# Patient Record
Sex: Female | Born: 1940 | Race: White | Hispanic: No | Smoking: Former smoker
Health system: Southern US, Community
[De-identification: ages and names within clinical notes are randomized; demographics above are authoritative.]

## PROBLEM LIST (undated history)

## (undated) DIAGNOSIS — I714 Abdominal aortic aneurysm, without rupture: Secondary | ICD-10-CM

## (undated) DIAGNOSIS — I671 Cerebral aneurysm, nonruptured: Secondary | ICD-10-CM

## (undated) DIAGNOSIS — F319 Bipolar disorder, unspecified: Secondary | ICD-10-CM

## (undated) DIAGNOSIS — I10 Essential (primary) hypertension: Secondary | ICD-10-CM

## (undated) DIAGNOSIS — G8929 Other chronic pain: Secondary | ICD-10-CM

## (undated) DIAGNOSIS — M549 Dorsalgia, unspecified: Secondary | ICD-10-CM

## (undated) DIAGNOSIS — C801 Malignant (primary) neoplasm, unspecified: Secondary | ICD-10-CM

## (undated) DIAGNOSIS — J449 Chronic obstructive pulmonary disease, unspecified: Secondary | ICD-10-CM

## (undated) DIAGNOSIS — J42 Unspecified chronic bronchitis: Secondary | ICD-10-CM

## (undated) HISTORY — DX: Dorsalgia, unspecified: M54.9

## (undated) HISTORY — PX: POLYPECTOMY: SHX149

## (undated) HISTORY — DX: Unspecified chronic bronchitis: J42

## (undated) HISTORY — PX: ABDOMINAL HYSTERECTOMY: SHX81

## (undated) HISTORY — DX: Abdominal aortic aneurysm, without rupture: I71.4

## (undated) HISTORY — DX: Malignant (primary) neoplasm, unspecified: C80.1

## (undated) HISTORY — DX: Essential (primary) hypertension: I10

## (undated) HISTORY — DX: Cerebral aneurysm, nonruptured: I67.1

## (undated) HISTORY — PX: BACK SURGERY: SHX140

## (undated) HISTORY — PX: ANEURYSM COILING: SHX5349

## (undated) HISTORY — DX: Other chronic pain: G89.29

## (undated) HISTORY — DX: Chronic obstructive pulmonary disease, unspecified: J44.9

## (undated) HISTORY — PX: CARDIAC SURGERY: SHX584

---

## 1997-06-09 ENCOUNTER — Encounter
Admission: RE | Admit: 1997-06-09 | Discharge: 1997-09-07 | Payer: Self-pay | Admitting: Physical Medicine and Rehabilitation

## 1997-10-02 ENCOUNTER — Encounter: Admission: RE | Admit: 1997-10-02 | Discharge: 1997-12-31 | Payer: Self-pay | Admitting: Orthopedic Surgery

## 1998-06-30 ENCOUNTER — Other Ambulatory Visit: Admission: RE | Admit: 1998-06-30 | Discharge: 1998-06-30 | Payer: Self-pay | Admitting: Urology

## 1998-10-08 ENCOUNTER — Ambulatory Visit (HOSPITAL_COMMUNITY): Admission: RE | Admit: 1998-10-08 | Discharge: 1998-10-08 | Payer: Self-pay | Admitting: Neurological Surgery

## 1998-10-08 ENCOUNTER — Encounter: Payer: Self-pay | Admitting: Neurological Surgery

## 1998-12-10 ENCOUNTER — Encounter: Payer: Self-pay | Admitting: Orthopedic Surgery

## 1998-12-10 ENCOUNTER — Encounter: Admission: RE | Admit: 1998-12-10 | Discharge: 1998-12-10 | Payer: Self-pay | Admitting: Orthopedic Surgery

## 2009-08-06 ENCOUNTER — Emergency Department (HOSPITAL_BASED_OUTPATIENT_CLINIC_OR_DEPARTMENT_OTHER): Admission: EM | Admit: 2009-08-06 | Discharge: 2009-08-06 | Payer: Self-pay | Admitting: Emergency Medicine

## 2009-08-06 ENCOUNTER — Ambulatory Visit: Payer: Self-pay | Admitting: Diagnostic Radiology

## 2015-01-08 DIAGNOSIS — F308 Other manic episodes: Secondary | ICD-10-CM | POA: Diagnosis not present

## 2015-01-08 DIAGNOSIS — R05 Cough: Secondary | ICD-10-CM | POA: Diagnosis not present

## 2015-01-08 DIAGNOSIS — I951 Orthostatic hypotension: Secondary | ICD-10-CM | POA: Diagnosis not present

## 2015-01-08 DIAGNOSIS — F319 Bipolar disorder, unspecified: Secondary | ICD-10-CM | POA: Diagnosis not present

## 2015-01-08 DIAGNOSIS — Z79899 Other long term (current) drug therapy: Secondary | ICD-10-CM | POA: Diagnosis not present

## 2015-01-08 DIAGNOSIS — I10 Essential (primary) hypertension: Secondary | ICD-10-CM | POA: Diagnosis not present

## 2015-01-08 DIAGNOSIS — T887XXA Unspecified adverse effect of drug or medicament, initial encounter: Secondary | ICD-10-CM | POA: Diagnosis not present

## 2015-01-11 DIAGNOSIS — F0280 Dementia in other diseases classified elsewhere without behavioral disturbance: Secondary | ICD-10-CM | POA: Diagnosis not present

## 2015-01-11 DIAGNOSIS — E7800 Pure hypercholesterolemia, unspecified: Secondary | ICD-10-CM | POA: Diagnosis present

## 2015-01-11 DIAGNOSIS — R569 Unspecified convulsions: Secondary | ICD-10-CM | POA: Diagnosis not present

## 2015-01-11 DIAGNOSIS — Z9181 History of falling: Secondary | ICD-10-CM | POA: Diagnosis not present

## 2015-01-11 DIAGNOSIS — I10 Essential (primary) hypertension: Secondary | ICD-10-CM | POA: Diagnosis not present

## 2015-01-11 DIAGNOSIS — Z882 Allergy status to sulfonamides status: Secondary | ICD-10-CM | POA: Diagnosis not present

## 2015-01-11 DIAGNOSIS — F0390 Unspecified dementia without behavioral disturbance: Secondary | ICD-10-CM | POA: Diagnosis not present

## 2015-01-11 DIAGNOSIS — Z888 Allergy status to other drugs, medicaments and biological substances status: Secondary | ICD-10-CM | POA: Diagnosis not present

## 2015-01-11 DIAGNOSIS — F0391 Unspecified dementia with behavioral disturbance: Secondary | ICD-10-CM | POA: Diagnosis not present

## 2015-01-11 DIAGNOSIS — E782 Mixed hyperlipidemia: Secondary | ICD-10-CM | POA: Diagnosis not present

## 2015-01-11 DIAGNOSIS — F319 Bipolar disorder, unspecified: Secondary | ICD-10-CM | POA: Diagnosis not present

## 2015-01-11 DIAGNOSIS — Z885 Allergy status to narcotic agent status: Secondary | ICD-10-CM | POA: Diagnosis not present

## 2015-01-11 DIAGNOSIS — R451 Restlessness and agitation: Secondary | ICD-10-CM | POA: Diagnosis not present

## 2015-01-11 DIAGNOSIS — B961 Klebsiella pneumoniae [K. pneumoniae] as the cause of diseases classified elsewhere: Secondary | ICD-10-CM | POA: Diagnosis present

## 2015-01-11 DIAGNOSIS — Z91041 Radiographic dye allergy status: Secondary | ICD-10-CM | POA: Diagnosis not present

## 2015-01-11 DIAGNOSIS — I671 Cerebral aneurysm, nonruptured: Secondary | ICD-10-CM | POA: Diagnosis present

## 2015-01-11 DIAGNOSIS — F17200 Nicotine dependence, unspecified, uncomplicated: Secondary | ICD-10-CM | POA: Diagnosis present

## 2015-01-11 DIAGNOSIS — Z880 Allergy status to penicillin: Secondary | ICD-10-CM | POA: Diagnosis not present

## 2015-01-11 DIAGNOSIS — R4182 Altered mental status, unspecified: Secondary | ICD-10-CM | POA: Diagnosis not present

## 2015-01-11 DIAGNOSIS — G4700 Insomnia, unspecified: Secondary | ICD-10-CM | POA: Diagnosis present

## 2015-01-11 DIAGNOSIS — E876 Hypokalemia: Secondary | ICD-10-CM | POA: Diagnosis not present

## 2015-01-11 DIAGNOSIS — F3113 Bipolar disorder, current episode manic without psychotic features, severe: Secondary | ICD-10-CM | POA: Diagnosis not present

## 2015-01-11 DIAGNOSIS — K219 Gastro-esophageal reflux disease without esophagitis: Secondary | ICD-10-CM | POA: Diagnosis present

## 2015-01-11 DIAGNOSIS — F0151 Vascular dementia with behavioral disturbance: Secondary | ICD-10-CM | POA: Diagnosis present

## 2015-01-11 DIAGNOSIS — G300 Alzheimer's disease with early onset: Secondary | ICD-10-CM | POA: Diagnosis not present

## 2015-01-11 DIAGNOSIS — F312 Bipolar disorder, current episode manic severe with psychotic features: Secondary | ICD-10-CM | POA: Diagnosis present

## 2015-01-11 DIAGNOSIS — N390 Urinary tract infection, site not specified: Secondary | ICD-10-CM | POA: Diagnosis not present

## 2015-01-11 DIAGNOSIS — R45851 Suicidal ideations: Secondary | ICD-10-CM | POA: Diagnosis not present

## 2015-01-11 DIAGNOSIS — F329 Major depressive disorder, single episode, unspecified: Secondary | ICD-10-CM | POA: Diagnosis not present

## 2015-01-11 DIAGNOSIS — E785 Hyperlipidemia, unspecified: Secondary | ICD-10-CM | POA: Diagnosis present

## 2015-01-11 DIAGNOSIS — M542 Cervicalgia: Secondary | ICD-10-CM | POA: Diagnosis present

## 2015-01-11 DIAGNOSIS — Z91040 Latex allergy status: Secondary | ICD-10-CM | POA: Diagnosis not present

## 2015-01-11 DIAGNOSIS — Z881 Allergy status to other antibiotic agents status: Secondary | ICD-10-CM | POA: Diagnosis not present

## 2015-01-11 DIAGNOSIS — G40319 Generalized idiopathic epilepsy and epileptic syndromes, intractable, without status epilepticus: Secondary | ICD-10-CM | POA: Diagnosis not present

## 2015-01-26 DIAGNOSIS — R569 Unspecified convulsions: Secondary | ICD-10-CM | POA: Diagnosis not present

## 2015-02-26 DIAGNOSIS — F3111 Bipolar disorder, current episode manic without psychotic features, mild: Secondary | ICD-10-CM | POA: Diagnosis not present

## 2015-02-26 DIAGNOSIS — F411 Generalized anxiety disorder: Secondary | ICD-10-CM | POA: Diagnosis not present

## 2015-03-10 DIAGNOSIS — R5383 Other fatigue: Secondary | ICD-10-CM | POA: Diagnosis not present

## 2015-03-10 DIAGNOSIS — F319 Bipolar disorder, unspecified: Secondary | ICD-10-CM | POA: Diagnosis not present

## 2015-03-10 DIAGNOSIS — R29898 Other symptoms and signs involving the musculoskeletal system: Secondary | ICD-10-CM | POA: Diagnosis not present

## 2015-03-10 DIAGNOSIS — G629 Polyneuropathy, unspecified: Secondary | ICD-10-CM | POA: Diagnosis not present

## 2015-03-10 DIAGNOSIS — Z79899 Other long term (current) drug therapy: Secondary | ICD-10-CM | POA: Diagnosis not present

## 2015-03-15 DIAGNOSIS — L570 Actinic keratosis: Secondary | ICD-10-CM | POA: Diagnosis not present

## 2015-03-15 DIAGNOSIS — L259 Unspecified contact dermatitis, unspecified cause: Secondary | ICD-10-CM | POA: Diagnosis not present

## 2015-03-30 DIAGNOSIS — F411 Generalized anxiety disorder: Secondary | ICD-10-CM | POA: Diagnosis not present

## 2015-03-30 DIAGNOSIS — F3111 Bipolar disorder, current episode manic without psychotic features, mild: Secondary | ICD-10-CM | POA: Diagnosis not present

## 2015-04-28 DIAGNOSIS — L409 Psoriasis, unspecified: Secondary | ICD-10-CM | POA: Diagnosis not present

## 2015-04-28 DIAGNOSIS — T63301A Toxic effect of unspecified spider venom, accidental (unintentional), initial encounter: Secondary | ICD-10-CM | POA: Diagnosis not present

## 2015-04-28 DIAGNOSIS — Z8659 Personal history of other mental and behavioral disorders: Secondary | ICD-10-CM | POA: Diagnosis not present

## 2015-04-28 DIAGNOSIS — F319 Bipolar disorder, unspecified: Secondary | ICD-10-CM | POA: Diagnosis not present

## 2015-05-25 DIAGNOSIS — F411 Generalized anxiety disorder: Secondary | ICD-10-CM | POA: Diagnosis not present

## 2015-05-25 DIAGNOSIS — F3111 Bipolar disorder, current episode manic without psychotic features, mild: Secondary | ICD-10-CM | POA: Diagnosis not present

## 2015-06-08 DIAGNOSIS — E039 Hypothyroidism, unspecified: Secondary | ICD-10-CM | POA: Diagnosis present

## 2015-06-08 DIAGNOSIS — F17210 Nicotine dependence, cigarettes, uncomplicated: Secondary | ICD-10-CM | POA: Diagnosis present

## 2015-06-08 DIAGNOSIS — S0093XA Contusion of unspecified part of head, initial encounter: Secondary | ICD-10-CM | POA: Diagnosis present

## 2015-06-08 DIAGNOSIS — I609 Nontraumatic subarachnoid hemorrhage, unspecified: Secondary | ICD-10-CM | POA: Diagnosis not present

## 2015-06-08 DIAGNOSIS — S065X9A Traumatic subdural hemorrhage with loss of consciousness of unspecified duration, initial encounter: Secondary | ICD-10-CM | POA: Diagnosis not present

## 2015-06-08 DIAGNOSIS — K219 Gastro-esophageal reflux disease without esophagitis: Secondary | ICD-10-CM | POA: Diagnosis present

## 2015-06-08 DIAGNOSIS — M1389 Other specified arthritis, multiple sites: Secondary | ICD-10-CM | POA: Diagnosis not present

## 2015-06-08 DIAGNOSIS — S0510XA Contusion of eyeball and orbital tissues, unspecified eye, initial encounter: Secondary | ICD-10-CM | POA: Diagnosis not present

## 2015-06-08 DIAGNOSIS — M47812 Spondylosis without myelopathy or radiculopathy, cervical region: Secondary | ICD-10-CM | POA: Diagnosis not present

## 2015-06-08 DIAGNOSIS — E785 Hyperlipidemia, unspecified: Secondary | ICD-10-CM | POA: Diagnosis present

## 2015-06-08 DIAGNOSIS — S0993XA Unspecified injury of face, initial encounter: Secondary | ICD-10-CM | POA: Diagnosis not present

## 2015-06-08 DIAGNOSIS — Z79899 Other long term (current) drug therapy: Secondary | ICD-10-CM | POA: Diagnosis not present

## 2015-06-08 DIAGNOSIS — S066X0A Traumatic subarachnoid hemorrhage without loss of consciousness, initial encounter: Secondary | ICD-10-CM | POA: Diagnosis not present

## 2015-06-08 DIAGNOSIS — Z716 Tobacco abuse counseling: Secondary | ICD-10-CM | POA: Diagnosis not present

## 2015-06-08 DIAGNOSIS — Z9181 History of falling: Secondary | ICD-10-CM | POA: Diagnosis not present

## 2015-06-08 DIAGNOSIS — S0012XA Contusion of left eyelid and periocular area, initial encounter: Secondary | ICD-10-CM | POA: Diagnosis not present

## 2015-06-08 DIAGNOSIS — Z87820 Personal history of traumatic brain injury: Secondary | ICD-10-CM | POA: Diagnosis not present

## 2015-06-08 DIAGNOSIS — T07 Unspecified multiple injuries: Secondary | ICD-10-CM | POA: Diagnosis not present

## 2015-06-08 DIAGNOSIS — R079 Chest pain, unspecified: Secondary | ICD-10-CM | POA: Diagnosis not present

## 2015-06-08 DIAGNOSIS — R9431 Abnormal electrocardiogram [ECG] [EKG]: Secondary | ICD-10-CM | POA: Diagnosis not present

## 2015-06-08 DIAGNOSIS — S0990XA Unspecified injury of head, initial encounter: Secondary | ICD-10-CM | POA: Diagnosis not present

## 2015-06-08 DIAGNOSIS — D696 Thrombocytopenia, unspecified: Secondary | ICD-10-CM | POA: Diagnosis present

## 2015-06-08 DIAGNOSIS — F0151 Vascular dementia with behavioral disturbance: Secondary | ICD-10-CM | POA: Diagnosis present

## 2015-06-08 DIAGNOSIS — I10 Essential (primary) hypertension: Secondary | ICD-10-CM | POA: Diagnosis present

## 2015-06-08 DIAGNOSIS — S199XXA Unspecified injury of neck, initial encounter: Secondary | ICD-10-CM | POA: Diagnosis not present

## 2015-06-08 DIAGNOSIS — G9340 Encephalopathy, unspecified: Secondary | ICD-10-CM | POA: Diagnosis present

## 2015-06-08 DIAGNOSIS — R402413 Glasgow coma scale score 13-15, at hospital admission: Secondary | ICD-10-CM | POA: Diagnosis not present

## 2015-06-08 DIAGNOSIS — R410 Disorientation, unspecified: Secondary | ICD-10-CM | POA: Diagnosis not present

## 2015-06-08 DIAGNOSIS — S066X9A Traumatic subarachnoid hemorrhage with loss of consciousness of unspecified duration, initial encounter: Secondary | ICD-10-CM | POA: Diagnosis not present

## 2015-06-08 DIAGNOSIS — W06XXXA Fall from bed, initial encounter: Secondary | ICD-10-CM | POA: Diagnosis not present

## 2015-06-08 DIAGNOSIS — F17200 Nicotine dependence, unspecified, uncomplicated: Secondary | ICD-10-CM | POA: Diagnosis not present

## 2015-06-08 DIAGNOSIS — R279 Unspecified lack of coordination: Secondary | ICD-10-CM | POA: Diagnosis not present

## 2015-06-08 DIAGNOSIS — S299XXA Unspecified injury of thorax, initial encounter: Secondary | ICD-10-CM | POA: Diagnosis not present

## 2015-06-08 DIAGNOSIS — S0003XA Contusion of scalp, initial encounter: Secondary | ICD-10-CM | POA: Diagnosis not present

## 2015-06-08 DIAGNOSIS — M6281 Muscle weakness (generalized): Secondary | ICD-10-CM | POA: Diagnosis not present

## 2015-06-16 DIAGNOSIS — F419 Anxiety disorder, unspecified: Secondary | ICD-10-CM | POA: Diagnosis not present

## 2015-06-16 DIAGNOSIS — R4182 Altered mental status, unspecified: Secondary | ICD-10-CM | POA: Diagnosis not present

## 2015-06-16 DIAGNOSIS — G4700 Insomnia, unspecified: Secondary | ICD-10-CM | POA: Diagnosis not present

## 2015-06-16 DIAGNOSIS — F319 Bipolar disorder, unspecified: Secondary | ICD-10-CM | POA: Diagnosis not present

## 2015-06-16 DIAGNOSIS — T402X5A Adverse effect of other opioids, initial encounter: Secondary | ICD-10-CM | POA: Diagnosis not present

## 2015-06-16 DIAGNOSIS — E785 Hyperlipidemia, unspecified: Secondary | ICD-10-CM | POA: Diagnosis not present

## 2015-06-16 DIAGNOSIS — F0151 Vascular dementia with behavioral disturbance: Secondary | ICD-10-CM | POA: Diagnosis not present

## 2015-06-16 DIAGNOSIS — E039 Hypothyroidism, unspecified: Secondary | ICD-10-CM | POA: Diagnosis not present

## 2015-06-16 DIAGNOSIS — W06XXXA Fall from bed, initial encounter: Secondary | ICD-10-CM | POA: Diagnosis not present

## 2015-06-16 DIAGNOSIS — K5903 Drug induced constipation: Secondary | ICD-10-CM | POA: Diagnosis not present

## 2015-06-16 DIAGNOSIS — R279 Unspecified lack of coordination: Secondary | ICD-10-CM | POA: Diagnosis not present

## 2015-06-16 DIAGNOSIS — M1389 Other specified arthritis, multiple sites: Secondary | ICD-10-CM | POA: Diagnosis not present

## 2015-06-16 DIAGNOSIS — I10 Essential (primary) hypertension: Secondary | ICD-10-CM | POA: Diagnosis not present

## 2015-06-16 DIAGNOSIS — S0093XA Contusion of unspecified part of head, initial encounter: Secondary | ICD-10-CM | POA: Diagnosis not present

## 2015-06-16 DIAGNOSIS — R410 Disorientation, unspecified: Secondary | ICD-10-CM | POA: Diagnosis not present

## 2015-06-16 DIAGNOSIS — Z9181 History of falling: Secondary | ICD-10-CM | POA: Diagnosis not present

## 2015-06-16 DIAGNOSIS — F0391 Unspecified dementia with behavioral disturbance: Secondary | ICD-10-CM | POA: Diagnosis not present

## 2015-06-16 DIAGNOSIS — M6281 Muscle weakness (generalized): Secondary | ICD-10-CM | POA: Diagnosis not present

## 2015-06-16 DIAGNOSIS — S0012XA Contusion of left eyelid and periocular area, initial encounter: Secondary | ICD-10-CM | POA: Diagnosis not present

## 2015-06-16 DIAGNOSIS — G4710 Hypersomnia, unspecified: Secondary | ICD-10-CM | POA: Diagnosis not present

## 2015-06-16 DIAGNOSIS — F17200 Nicotine dependence, unspecified, uncomplicated: Secondary | ICD-10-CM | POA: Diagnosis not present

## 2015-06-16 DIAGNOSIS — R531 Weakness: Secondary | ICD-10-CM | POA: Diagnosis not present

## 2015-06-16 DIAGNOSIS — Z87820 Personal history of traumatic brain injury: Secondary | ICD-10-CM | POA: Diagnosis not present

## 2015-06-16 DIAGNOSIS — K219 Gastro-esophageal reflux disease without esophagitis: Secondary | ICD-10-CM | POA: Diagnosis not present

## 2015-06-16 DIAGNOSIS — I609 Nontraumatic subarachnoid hemorrhage, unspecified: Secondary | ICD-10-CM | POA: Diagnosis not present

## 2015-06-16 DIAGNOSIS — S066X0A Traumatic subarachnoid hemorrhage without loss of consciousness, initial encounter: Secondary | ICD-10-CM | POA: Diagnosis not present

## 2015-06-16 DIAGNOSIS — R52 Pain, unspecified: Secondary | ICD-10-CM | POA: Diagnosis not present

## 2015-06-17 DIAGNOSIS — F419 Anxiety disorder, unspecified: Secondary | ICD-10-CM | POA: Diagnosis not present

## 2015-06-17 DIAGNOSIS — G4710 Hypersomnia, unspecified: Secondary | ICD-10-CM | POA: Diagnosis not present

## 2015-06-17 DIAGNOSIS — F0391 Unspecified dementia with behavioral disturbance: Secondary | ICD-10-CM | POA: Diagnosis not present

## 2015-06-17 DIAGNOSIS — S0093XA Contusion of unspecified part of head, initial encounter: Secondary | ICD-10-CM | POA: Diagnosis not present

## 2015-06-17 DIAGNOSIS — I609 Nontraumatic subarachnoid hemorrhage, unspecified: Secondary | ICD-10-CM | POA: Diagnosis not present

## 2015-06-17 DIAGNOSIS — R531 Weakness: Secondary | ICD-10-CM | POA: Diagnosis not present

## 2015-06-21 DIAGNOSIS — T402X5A Adverse effect of other opioids, initial encounter: Secondary | ICD-10-CM | POA: Diagnosis not present

## 2015-06-21 DIAGNOSIS — F0391 Unspecified dementia with behavioral disturbance: Secondary | ICD-10-CM | POA: Diagnosis not present

## 2015-06-21 DIAGNOSIS — K5903 Drug induced constipation: Secondary | ICD-10-CM | POA: Diagnosis not present

## 2015-06-21 DIAGNOSIS — R52 Pain, unspecified: Secondary | ICD-10-CM | POA: Diagnosis not present

## 2015-06-22 DIAGNOSIS — G4700 Insomnia, unspecified: Secondary | ICD-10-CM | POA: Diagnosis not present

## 2015-06-22 DIAGNOSIS — F419 Anxiety disorder, unspecified: Secondary | ICD-10-CM | POA: Diagnosis not present

## 2015-06-22 DIAGNOSIS — F0391 Unspecified dementia with behavioral disturbance: Secondary | ICD-10-CM | POA: Diagnosis not present

## 2015-06-22 DIAGNOSIS — R52 Pain, unspecified: Secondary | ICD-10-CM | POA: Diagnosis not present

## 2015-06-23 DIAGNOSIS — I609 Nontraumatic subarachnoid hemorrhage, unspecified: Secondary | ICD-10-CM | POA: Diagnosis not present

## 2015-06-23 DIAGNOSIS — G4710 Hypersomnia, unspecified: Secondary | ICD-10-CM | POA: Diagnosis not present

## 2015-06-23 DIAGNOSIS — F419 Anxiety disorder, unspecified: Secondary | ICD-10-CM | POA: Diagnosis not present

## 2015-06-23 DIAGNOSIS — R531 Weakness: Secondary | ICD-10-CM | POA: Diagnosis not present

## 2015-06-23 DIAGNOSIS — S0093XA Contusion of unspecified part of head, initial encounter: Secondary | ICD-10-CM | POA: Diagnosis not present

## 2015-06-23 DIAGNOSIS — F0391 Unspecified dementia with behavioral disturbance: Secondary | ICD-10-CM | POA: Diagnosis not present

## 2015-06-24 DIAGNOSIS — F419 Anxiety disorder, unspecified: Secondary | ICD-10-CM | POA: Diagnosis not present

## 2015-06-24 DIAGNOSIS — K5903 Drug induced constipation: Secondary | ICD-10-CM | POA: Diagnosis not present

## 2015-06-24 DIAGNOSIS — G4700 Insomnia, unspecified: Secondary | ICD-10-CM | POA: Diagnosis not present

## 2015-06-24 DIAGNOSIS — F0391 Unspecified dementia with behavioral disturbance: Secondary | ICD-10-CM | POA: Diagnosis not present

## 2015-06-28 DIAGNOSIS — K5903 Drug induced constipation: Secondary | ICD-10-CM | POA: Diagnosis not present

## 2015-06-28 DIAGNOSIS — S0093XA Contusion of unspecified part of head, initial encounter: Secondary | ICD-10-CM | POA: Diagnosis not present

## 2015-06-28 DIAGNOSIS — I609 Nontraumatic subarachnoid hemorrhage, unspecified: Secondary | ICD-10-CM | POA: Diagnosis not present

## 2015-06-28 DIAGNOSIS — R52 Pain, unspecified: Secondary | ICD-10-CM | POA: Diagnosis not present

## 2015-07-01 DIAGNOSIS — W06XXXD Fall from bed, subsequent encounter: Secondary | ICD-10-CM | POA: Diagnosis not present

## 2015-07-01 DIAGNOSIS — Z9181 History of falling: Secondary | ICD-10-CM | POA: Diagnosis not present

## 2015-07-01 DIAGNOSIS — M1389 Other specified arthritis, multiple sites: Secondary | ICD-10-CM | POA: Diagnosis not present

## 2015-07-01 DIAGNOSIS — K219 Gastro-esophageal reflux disease without esophagitis: Secondary | ICD-10-CM | POA: Diagnosis not present

## 2015-07-01 DIAGNOSIS — E785 Hyperlipidemia, unspecified: Secondary | ICD-10-CM | POA: Diagnosis not present

## 2015-07-01 DIAGNOSIS — F17210 Nicotine dependence, cigarettes, uncomplicated: Secondary | ICD-10-CM | POA: Diagnosis not present

## 2015-07-01 DIAGNOSIS — F0151 Vascular dementia with behavioral disturbance: Secondary | ICD-10-CM | POA: Diagnosis not present

## 2015-07-01 DIAGNOSIS — F319 Bipolar disorder, unspecified: Secondary | ICD-10-CM | POA: Diagnosis not present

## 2015-07-01 DIAGNOSIS — I10 Essential (primary) hypertension: Secondary | ICD-10-CM | POA: Diagnosis not present

## 2015-07-01 DIAGNOSIS — E039 Hypothyroidism, unspecified: Secondary | ICD-10-CM | POA: Diagnosis not present

## 2015-07-01 DIAGNOSIS — S066X9S Traumatic subarachnoid hemorrhage with loss of consciousness of unspecified duration, sequela: Secondary | ICD-10-CM | POA: Diagnosis not present

## 2015-07-01 DIAGNOSIS — M6281 Muscle weakness (generalized): Secondary | ICD-10-CM | POA: Diagnosis not present

## 2015-07-02 DIAGNOSIS — F0151 Vascular dementia with behavioral disturbance: Secondary | ICD-10-CM | POA: Diagnosis not present

## 2015-07-02 DIAGNOSIS — F319 Bipolar disorder, unspecified: Secondary | ICD-10-CM | POA: Diagnosis not present

## 2015-07-02 DIAGNOSIS — M1389 Other specified arthritis, multiple sites: Secondary | ICD-10-CM | POA: Diagnosis not present

## 2015-07-02 DIAGNOSIS — S066X9S Traumatic subarachnoid hemorrhage with loss of consciousness of unspecified duration, sequela: Secondary | ICD-10-CM | POA: Diagnosis not present

## 2015-07-02 DIAGNOSIS — M6281 Muscle weakness (generalized): Secondary | ICD-10-CM | POA: Diagnosis not present

## 2015-07-02 DIAGNOSIS — I10 Essential (primary) hypertension: Secondary | ICD-10-CM | POA: Diagnosis not present

## 2015-07-07 DIAGNOSIS — S066X9S Traumatic subarachnoid hemorrhage with loss of consciousness of unspecified duration, sequela: Secondary | ICD-10-CM | POA: Diagnosis not present

## 2015-07-07 DIAGNOSIS — M1389 Other specified arthritis, multiple sites: Secondary | ICD-10-CM | POA: Diagnosis not present

## 2015-07-07 DIAGNOSIS — I10 Essential (primary) hypertension: Secondary | ICD-10-CM | POA: Diagnosis not present

## 2015-07-07 DIAGNOSIS — F0151 Vascular dementia with behavioral disturbance: Secondary | ICD-10-CM | POA: Diagnosis not present

## 2015-07-07 DIAGNOSIS — F319 Bipolar disorder, unspecified: Secondary | ICD-10-CM | POA: Diagnosis not present

## 2015-07-07 DIAGNOSIS — M6281 Muscle weakness (generalized): Secondary | ICD-10-CM | POA: Diagnosis not present

## 2015-07-08 DIAGNOSIS — F319 Bipolar disorder, unspecified: Secondary | ICD-10-CM | POA: Diagnosis not present

## 2015-07-08 DIAGNOSIS — I10 Essential (primary) hypertension: Secondary | ICD-10-CM | POA: Diagnosis not present

## 2015-07-08 DIAGNOSIS — M6281 Muscle weakness (generalized): Secondary | ICD-10-CM | POA: Diagnosis not present

## 2015-07-08 DIAGNOSIS — M1389 Other specified arthritis, multiple sites: Secondary | ICD-10-CM | POA: Diagnosis not present

## 2015-07-08 DIAGNOSIS — S066X9S Traumatic subarachnoid hemorrhage with loss of consciousness of unspecified duration, sequela: Secondary | ICD-10-CM | POA: Diagnosis not present

## 2015-07-08 DIAGNOSIS — F0151 Vascular dementia with behavioral disturbance: Secondary | ICD-10-CM | POA: Diagnosis not present

## 2015-07-10 DIAGNOSIS — M1389 Other specified arthritis, multiple sites: Secondary | ICD-10-CM | POA: Diagnosis not present

## 2015-07-10 DIAGNOSIS — M6281 Muscle weakness (generalized): Secondary | ICD-10-CM | POA: Diagnosis not present

## 2015-07-10 DIAGNOSIS — I10 Essential (primary) hypertension: Secondary | ICD-10-CM | POA: Diagnosis not present

## 2015-07-10 DIAGNOSIS — F319 Bipolar disorder, unspecified: Secondary | ICD-10-CM | POA: Diagnosis not present

## 2015-07-10 DIAGNOSIS — F0151 Vascular dementia with behavioral disturbance: Secondary | ICD-10-CM | POA: Diagnosis not present

## 2015-07-10 DIAGNOSIS — S066X9S Traumatic subarachnoid hemorrhage with loss of consciousness of unspecified duration, sequela: Secondary | ICD-10-CM | POA: Diagnosis not present

## 2015-07-13 DIAGNOSIS — F0151 Vascular dementia with behavioral disturbance: Secondary | ICD-10-CM | POA: Diagnosis not present

## 2015-07-13 DIAGNOSIS — F319 Bipolar disorder, unspecified: Secondary | ICD-10-CM | POA: Diagnosis not present

## 2015-07-13 DIAGNOSIS — M6281 Muscle weakness (generalized): Secondary | ICD-10-CM | POA: Diagnosis not present

## 2015-07-13 DIAGNOSIS — S066X9S Traumatic subarachnoid hemorrhage with loss of consciousness of unspecified duration, sequela: Secondary | ICD-10-CM | POA: Diagnosis not present

## 2015-07-13 DIAGNOSIS — M1389 Other specified arthritis, multiple sites: Secondary | ICD-10-CM | POA: Diagnosis not present

## 2015-07-13 DIAGNOSIS — I10 Essential (primary) hypertension: Secondary | ICD-10-CM | POA: Diagnosis not present

## 2015-07-15 DIAGNOSIS — M6281 Muscle weakness (generalized): Secondary | ICD-10-CM | POA: Diagnosis not present

## 2015-07-15 DIAGNOSIS — M1389 Other specified arthritis, multiple sites: Secondary | ICD-10-CM | POA: Diagnosis not present

## 2015-07-15 DIAGNOSIS — I10 Essential (primary) hypertension: Secondary | ICD-10-CM | POA: Diagnosis not present

## 2015-07-15 DIAGNOSIS — F0151 Vascular dementia with behavioral disturbance: Secondary | ICD-10-CM | POA: Diagnosis not present

## 2015-07-15 DIAGNOSIS — F319 Bipolar disorder, unspecified: Secondary | ICD-10-CM | POA: Diagnosis not present

## 2015-07-15 DIAGNOSIS — S066X9S Traumatic subarachnoid hemorrhage with loss of consciousness of unspecified duration, sequela: Secondary | ICD-10-CM | POA: Diagnosis not present

## 2015-07-16 DIAGNOSIS — K5900 Constipation, unspecified: Secondary | ICD-10-CM | POA: Diagnosis not present

## 2015-07-16 DIAGNOSIS — F319 Bipolar disorder, unspecified: Secondary | ICD-10-CM | POA: Diagnosis not present

## 2015-07-16 DIAGNOSIS — F419 Anxiety disorder, unspecified: Secondary | ICD-10-CM | POA: Diagnosis not present

## 2015-07-16 DIAGNOSIS — R05 Cough: Secondary | ICD-10-CM | POA: Diagnosis not present

## 2015-07-16 DIAGNOSIS — J302 Other seasonal allergic rhinitis: Secondary | ICD-10-CM | POA: Diagnosis not present

## 2015-07-16 DIAGNOSIS — Z79899 Other long term (current) drug therapy: Secondary | ICD-10-CM | POA: Diagnosis not present

## 2015-07-16 DIAGNOSIS — Z87828 Personal history of other (healed) physical injury and trauma: Secondary | ICD-10-CM | POA: Diagnosis not present

## 2015-07-16 DIAGNOSIS — Z9181 History of falling: Secondary | ICD-10-CM | POA: Diagnosis not present

## 2015-07-16 DIAGNOSIS — I10 Essential (primary) hypertension: Secondary | ICD-10-CM | POA: Diagnosis not present

## 2015-07-16 DIAGNOSIS — G4700 Insomnia, unspecified: Secondary | ICD-10-CM | POA: Diagnosis not present

## 2015-07-22 DIAGNOSIS — F319 Bipolar disorder, unspecified: Secondary | ICD-10-CM | POA: Diagnosis not present

## 2015-07-22 DIAGNOSIS — M6281 Muscle weakness (generalized): Secondary | ICD-10-CM | POA: Diagnosis not present

## 2015-07-22 DIAGNOSIS — F0151 Vascular dementia with behavioral disturbance: Secondary | ICD-10-CM | POA: Diagnosis not present

## 2015-07-22 DIAGNOSIS — I10 Essential (primary) hypertension: Secondary | ICD-10-CM | POA: Diagnosis not present

## 2015-07-22 DIAGNOSIS — M1389 Other specified arthritis, multiple sites: Secondary | ICD-10-CM | POA: Diagnosis not present

## 2015-07-22 DIAGNOSIS — S066X9S Traumatic subarachnoid hemorrhage with loss of consciousness of unspecified duration, sequela: Secondary | ICD-10-CM | POA: Diagnosis not present

## 2015-07-23 DIAGNOSIS — F319 Bipolar disorder, unspecified: Secondary | ICD-10-CM | POA: Diagnosis not present

## 2015-07-23 DIAGNOSIS — M1389 Other specified arthritis, multiple sites: Secondary | ICD-10-CM | POA: Diagnosis not present

## 2015-07-23 DIAGNOSIS — S066X9S Traumatic subarachnoid hemorrhage with loss of consciousness of unspecified duration, sequela: Secondary | ICD-10-CM | POA: Diagnosis not present

## 2015-07-23 DIAGNOSIS — M6281 Muscle weakness (generalized): Secondary | ICD-10-CM | POA: Diagnosis not present

## 2015-07-23 DIAGNOSIS — F0151 Vascular dementia with behavioral disturbance: Secondary | ICD-10-CM | POA: Diagnosis not present

## 2015-07-23 DIAGNOSIS — I10 Essential (primary) hypertension: Secondary | ICD-10-CM | POA: Diagnosis not present

## 2015-08-04 DIAGNOSIS — M1389 Other specified arthritis, multiple sites: Secondary | ICD-10-CM | POA: Diagnosis not present

## 2015-08-04 DIAGNOSIS — M6281 Muscle weakness (generalized): Secondary | ICD-10-CM | POA: Diagnosis not present

## 2015-08-04 DIAGNOSIS — I10 Essential (primary) hypertension: Secondary | ICD-10-CM | POA: Diagnosis not present

## 2015-08-04 DIAGNOSIS — F0151 Vascular dementia with behavioral disturbance: Secondary | ICD-10-CM | POA: Diagnosis not present

## 2015-08-04 DIAGNOSIS — S066X9S Traumatic subarachnoid hemorrhage with loss of consciousness of unspecified duration, sequela: Secondary | ICD-10-CM | POA: Diagnosis not present

## 2015-08-04 DIAGNOSIS — F319 Bipolar disorder, unspecified: Secondary | ICD-10-CM | POA: Diagnosis not present

## 2015-08-06 DIAGNOSIS — I10 Essential (primary) hypertension: Secondary | ICD-10-CM | POA: Diagnosis not present

## 2015-08-06 DIAGNOSIS — M6281 Muscle weakness (generalized): Secondary | ICD-10-CM | POA: Diagnosis not present

## 2015-08-06 DIAGNOSIS — M1389 Other specified arthritis, multiple sites: Secondary | ICD-10-CM | POA: Diagnosis not present

## 2015-08-06 DIAGNOSIS — F319 Bipolar disorder, unspecified: Secondary | ICD-10-CM | POA: Diagnosis not present

## 2015-08-06 DIAGNOSIS — F0151 Vascular dementia with behavioral disturbance: Secondary | ICD-10-CM | POA: Diagnosis not present

## 2015-08-06 DIAGNOSIS — S066X9S Traumatic subarachnoid hemorrhage with loss of consciousness of unspecified duration, sequela: Secondary | ICD-10-CM | POA: Diagnosis not present

## 2015-08-10 DIAGNOSIS — M6281 Muscle weakness (generalized): Secondary | ICD-10-CM | POA: Diagnosis not present

## 2015-08-10 DIAGNOSIS — M1389 Other specified arthritis, multiple sites: Secondary | ICD-10-CM | POA: Diagnosis not present

## 2015-08-10 DIAGNOSIS — F319 Bipolar disorder, unspecified: Secondary | ICD-10-CM | POA: Diagnosis not present

## 2015-08-10 DIAGNOSIS — F0151 Vascular dementia with behavioral disturbance: Secondary | ICD-10-CM | POA: Diagnosis not present

## 2015-08-10 DIAGNOSIS — I10 Essential (primary) hypertension: Secondary | ICD-10-CM | POA: Diagnosis not present

## 2015-08-10 DIAGNOSIS — S066X9S Traumatic subarachnoid hemorrhage with loss of consciousness of unspecified duration, sequela: Secondary | ICD-10-CM | POA: Diagnosis not present

## 2015-08-12 DIAGNOSIS — S066X9S Traumatic subarachnoid hemorrhage with loss of consciousness of unspecified duration, sequela: Secondary | ICD-10-CM | POA: Diagnosis not present

## 2015-08-12 DIAGNOSIS — I10 Essential (primary) hypertension: Secondary | ICD-10-CM | POA: Diagnosis not present

## 2015-08-12 DIAGNOSIS — M6281 Muscle weakness (generalized): Secondary | ICD-10-CM | POA: Diagnosis not present

## 2015-08-12 DIAGNOSIS — F319 Bipolar disorder, unspecified: Secondary | ICD-10-CM | POA: Diagnosis not present

## 2015-08-12 DIAGNOSIS — F0151 Vascular dementia with behavioral disturbance: Secondary | ICD-10-CM | POA: Diagnosis not present

## 2015-08-12 DIAGNOSIS — M1389 Other specified arthritis, multiple sites: Secondary | ICD-10-CM | POA: Diagnosis not present

## 2015-08-17 DIAGNOSIS — F411 Generalized anxiety disorder: Secondary | ICD-10-CM | POA: Diagnosis not present

## 2015-08-17 DIAGNOSIS — F3111 Bipolar disorder, current episode manic without psychotic features, mild: Secondary | ICD-10-CM | POA: Diagnosis not present

## 2015-08-18 DIAGNOSIS — I10 Essential (primary) hypertension: Secondary | ICD-10-CM | POA: Diagnosis not present

## 2015-08-18 DIAGNOSIS — F0151 Vascular dementia with behavioral disturbance: Secondary | ICD-10-CM | POA: Diagnosis not present

## 2015-08-18 DIAGNOSIS — S066X9S Traumatic subarachnoid hemorrhage with loss of consciousness of unspecified duration, sequela: Secondary | ICD-10-CM | POA: Diagnosis not present

## 2015-08-18 DIAGNOSIS — M6281 Muscle weakness (generalized): Secondary | ICD-10-CM | POA: Diagnosis not present

## 2015-08-18 DIAGNOSIS — M1389 Other specified arthritis, multiple sites: Secondary | ICD-10-CM | POA: Diagnosis not present

## 2015-08-18 DIAGNOSIS — F319 Bipolar disorder, unspecified: Secondary | ICD-10-CM | POA: Diagnosis not present

## 2015-08-19 DIAGNOSIS — M6281 Muscle weakness (generalized): Secondary | ICD-10-CM | POA: Diagnosis not present

## 2015-08-19 DIAGNOSIS — M1389 Other specified arthritis, multiple sites: Secondary | ICD-10-CM | POA: Diagnosis not present

## 2015-08-19 DIAGNOSIS — S066X9S Traumatic subarachnoid hemorrhage with loss of consciousness of unspecified duration, sequela: Secondary | ICD-10-CM | POA: Diagnosis not present

## 2015-08-19 DIAGNOSIS — F319 Bipolar disorder, unspecified: Secondary | ICD-10-CM | POA: Diagnosis not present

## 2015-08-19 DIAGNOSIS — I10 Essential (primary) hypertension: Secondary | ICD-10-CM | POA: Diagnosis not present

## 2015-08-19 DIAGNOSIS — F0151 Vascular dementia with behavioral disturbance: Secondary | ICD-10-CM | POA: Diagnosis not present

## 2015-08-24 DIAGNOSIS — F319 Bipolar disorder, unspecified: Secondary | ICD-10-CM | POA: Diagnosis not present

## 2015-08-24 DIAGNOSIS — M6281 Muscle weakness (generalized): Secondary | ICD-10-CM | POA: Diagnosis not present

## 2015-08-24 DIAGNOSIS — I10 Essential (primary) hypertension: Secondary | ICD-10-CM | POA: Diagnosis not present

## 2015-08-24 DIAGNOSIS — F0151 Vascular dementia with behavioral disturbance: Secondary | ICD-10-CM | POA: Diagnosis not present

## 2015-08-24 DIAGNOSIS — M1389 Other specified arthritis, multiple sites: Secondary | ICD-10-CM | POA: Diagnosis not present

## 2015-08-24 DIAGNOSIS — S066X9S Traumatic subarachnoid hemorrhage with loss of consciousness of unspecified duration, sequela: Secondary | ICD-10-CM | POA: Diagnosis not present

## 2015-08-27 DIAGNOSIS — F0151 Vascular dementia with behavioral disturbance: Secondary | ICD-10-CM | POA: Diagnosis not present

## 2015-08-27 DIAGNOSIS — F319 Bipolar disorder, unspecified: Secondary | ICD-10-CM | POA: Diagnosis not present

## 2015-08-27 DIAGNOSIS — M6281 Muscle weakness (generalized): Secondary | ICD-10-CM | POA: Diagnosis not present

## 2015-08-27 DIAGNOSIS — M1389 Other specified arthritis, multiple sites: Secondary | ICD-10-CM | POA: Diagnosis not present

## 2015-08-27 DIAGNOSIS — I10 Essential (primary) hypertension: Secondary | ICD-10-CM | POA: Diagnosis not present

## 2015-08-27 DIAGNOSIS — S066X9S Traumatic subarachnoid hemorrhage with loss of consciousness of unspecified duration, sequela: Secondary | ICD-10-CM | POA: Diagnosis not present

## 2015-10-11 DIAGNOSIS — Z23 Encounter for immunization: Secondary | ICD-10-CM | POA: Diagnosis not present

## 2015-10-11 DIAGNOSIS — K5900 Constipation, unspecified: Secondary | ICD-10-CM | POA: Diagnosis not present

## 2015-10-11 DIAGNOSIS — R443 Hallucinations, unspecified: Secondary | ICD-10-CM | POA: Diagnosis not present

## 2015-10-11 DIAGNOSIS — Z79899 Other long term (current) drug therapy: Secondary | ICD-10-CM | POA: Diagnosis not present

## 2015-10-11 DIAGNOSIS — Z730 Burn-out: Secondary | ICD-10-CM | POA: Diagnosis not present

## 2015-10-11 DIAGNOSIS — G4700 Insomnia, unspecified: Secondary | ICD-10-CM | POA: Diagnosis not present

## 2015-10-11 DIAGNOSIS — R5383 Other fatigue: Secondary | ICD-10-CM | POA: Diagnosis not present

## 2015-10-11 DIAGNOSIS — G255 Other chorea: Secondary | ICD-10-CM | POA: Diagnosis not present

## 2015-10-11 DIAGNOSIS — R946 Abnormal results of thyroid function studies: Secondary | ICD-10-CM | POA: Diagnosis not present

## 2015-10-11 DIAGNOSIS — J40 Bronchitis, not specified as acute or chronic: Secondary | ICD-10-CM | POA: Diagnosis not present

## 2015-10-11 DIAGNOSIS — I10 Essential (primary) hypertension: Secondary | ICD-10-CM | POA: Diagnosis not present

## 2015-10-11 DIAGNOSIS — F319 Bipolar disorder, unspecified: Secondary | ICD-10-CM | POA: Diagnosis not present

## 2015-10-11 DIAGNOSIS — N390 Urinary tract infection, site not specified: Secondary | ICD-10-CM | POA: Diagnosis not present

## 2015-10-18 DIAGNOSIS — G4700 Insomnia, unspecified: Secondary | ICD-10-CM | POA: Diagnosis not present

## 2015-10-18 DIAGNOSIS — F319 Bipolar disorder, unspecified: Secondary | ICD-10-CM | POA: Diagnosis not present

## 2015-10-18 DIAGNOSIS — R262 Difficulty in walking, not elsewhere classified: Secondary | ICD-10-CM | POA: Diagnosis not present

## 2015-10-18 DIAGNOSIS — R946 Abnormal results of thyroid function studies: Secondary | ICD-10-CM | POA: Diagnosis not present

## 2015-11-09 DIAGNOSIS — F411 Generalized anxiety disorder: Secondary | ICD-10-CM | POA: Diagnosis not present

## 2015-11-09 DIAGNOSIS — F3111 Bipolar disorder, current episode manic without psychotic features, mild: Secondary | ICD-10-CM | POA: Diagnosis not present

## 2015-11-16 DIAGNOSIS — F319 Bipolar disorder, unspecified: Secondary | ICD-10-CM | POA: Diagnosis not present

## 2015-11-16 DIAGNOSIS — F419 Anxiety disorder, unspecified: Secondary | ICD-10-CM | POA: Diagnosis not present

## 2015-11-16 DIAGNOSIS — M542 Cervicalgia: Secondary | ICD-10-CM | POA: Diagnosis not present

## 2015-11-16 DIAGNOSIS — E039 Hypothyroidism, unspecified: Secondary | ICD-10-CM | POA: Diagnosis not present

## 2015-12-02 DIAGNOSIS — M25562 Pain in left knee: Secondary | ICD-10-CM | POA: Diagnosis not present

## 2015-12-02 DIAGNOSIS — R350 Frequency of micturition: Secondary | ICD-10-CM | POA: Diagnosis not present

## 2015-12-02 DIAGNOSIS — M659 Synovitis and tenosynovitis, unspecified: Secondary | ICD-10-CM | POA: Diagnosis not present

## 2015-12-08 DIAGNOSIS — H527 Unspecified disorder of refraction: Secondary | ICD-10-CM | POA: Diagnosis not present

## 2015-12-08 DIAGNOSIS — H2513 Age-related nuclear cataract, bilateral: Secondary | ICD-10-CM | POA: Diagnosis not present

## 2015-12-08 DIAGNOSIS — H2511 Age-related nuclear cataract, right eye: Secondary | ICD-10-CM | POA: Diagnosis not present

## 2015-12-08 DIAGNOSIS — H1851 Endothelial corneal dystrophy: Secondary | ICD-10-CM | POA: Diagnosis not present

## 2016-01-06 DIAGNOSIS — I10 Essential (primary) hypertension: Secondary | ICD-10-CM | POA: Diagnosis not present

## 2016-01-06 DIAGNOSIS — H00013 Hordeolum externum right eye, unspecified eyelid: Secondary | ICD-10-CM | POA: Diagnosis not present

## 2016-01-06 DIAGNOSIS — E039 Hypothyroidism, unspecified: Secondary | ICD-10-CM | POA: Diagnosis not present

## 2016-01-06 DIAGNOSIS — J00 Acute nasopharyngitis [common cold]: Secondary | ICD-10-CM | POA: Diagnosis not present

## 2016-01-06 DIAGNOSIS — M25552 Pain in left hip: Secondary | ICD-10-CM | POA: Diagnosis not present

## 2016-01-14 DIAGNOSIS — H2513 Age-related nuclear cataract, bilateral: Secondary | ICD-10-CM | POA: Diagnosis not present

## 2016-02-16 DIAGNOSIS — L739 Follicular disorder, unspecified: Secondary | ICD-10-CM | POA: Diagnosis not present

## 2016-02-16 DIAGNOSIS — D239 Other benign neoplasm of skin, unspecified: Secondary | ICD-10-CM | POA: Diagnosis not present

## 2016-02-28 DIAGNOSIS — M542 Cervicalgia: Secondary | ICD-10-CM | POA: Diagnosis not present

## 2016-02-28 DIAGNOSIS — F319 Bipolar disorder, unspecified: Secondary | ICD-10-CM | POA: Diagnosis not present

## 2016-02-28 DIAGNOSIS — M48061 Spinal stenosis, lumbar region without neurogenic claudication: Secondary | ICD-10-CM | POA: Diagnosis not present

## 2016-03-06 DIAGNOSIS — M4802 Spinal stenosis, cervical region: Secondary | ICD-10-CM | POA: Diagnosis not present

## 2016-03-06 DIAGNOSIS — M9901 Segmental and somatic dysfunction of cervical region: Secondary | ICD-10-CM | POA: Diagnosis not present

## 2016-03-06 DIAGNOSIS — M5032 Other cervical disc degeneration, mid-cervical region, unspecified level: Secondary | ICD-10-CM | POA: Diagnosis not present

## 2016-03-06 DIAGNOSIS — M542 Cervicalgia: Secondary | ICD-10-CM | POA: Diagnosis not present

## 2016-03-08 DIAGNOSIS — M542 Cervicalgia: Secondary | ICD-10-CM | POA: Diagnosis not present

## 2016-03-08 DIAGNOSIS — M4802 Spinal stenosis, cervical region: Secondary | ICD-10-CM | POA: Diagnosis not present

## 2016-03-08 DIAGNOSIS — M9901 Segmental and somatic dysfunction of cervical region: Secondary | ICD-10-CM | POA: Diagnosis not present

## 2016-03-08 DIAGNOSIS — M5032 Other cervical disc degeneration, mid-cervical region, unspecified level: Secondary | ICD-10-CM | POA: Diagnosis not present

## 2016-03-09 DIAGNOSIS — M545 Low back pain: Secondary | ICD-10-CM | POA: Diagnosis not present

## 2016-03-09 DIAGNOSIS — M5412 Radiculopathy, cervical region: Secondary | ICD-10-CM | POA: Diagnosis not present

## 2016-03-13 DIAGNOSIS — M545 Low back pain: Secondary | ICD-10-CM | POA: Diagnosis not present

## 2016-03-13 DIAGNOSIS — M5032 Other cervical disc degeneration, mid-cervical region, unspecified level: Secondary | ICD-10-CM | POA: Diagnosis not present

## 2016-03-13 DIAGNOSIS — M542 Cervicalgia: Secondary | ICD-10-CM | POA: Diagnosis not present

## 2016-03-13 DIAGNOSIS — M5412 Radiculopathy, cervical region: Secondary | ICD-10-CM | POA: Diagnosis not present

## 2016-03-13 DIAGNOSIS — M25511 Pain in right shoulder: Secondary | ICD-10-CM | POA: Diagnosis not present

## 2016-03-13 DIAGNOSIS — M9901 Segmental and somatic dysfunction of cervical region: Secondary | ICD-10-CM | POA: Diagnosis not present

## 2016-03-13 DIAGNOSIS — M5417 Radiculopathy, lumbosacral region: Secondary | ICD-10-CM | POA: Diagnosis not present

## 2016-03-13 DIAGNOSIS — M4802 Spinal stenosis, cervical region: Secondary | ICD-10-CM | POA: Diagnosis not present

## 2016-03-14 DIAGNOSIS — F3111 Bipolar disorder, current episode manic without psychotic features, mild: Secondary | ICD-10-CM | POA: Diagnosis not present

## 2016-03-14 DIAGNOSIS — F411 Generalized anxiety disorder: Secondary | ICD-10-CM | POA: Diagnosis not present

## 2016-03-15 DIAGNOSIS — M5417 Radiculopathy, lumbosacral region: Secondary | ICD-10-CM | POA: Diagnosis not present

## 2016-03-15 DIAGNOSIS — M545 Low back pain: Secondary | ICD-10-CM | POA: Diagnosis not present

## 2016-03-15 DIAGNOSIS — M5032 Other cervical disc degeneration, mid-cervical region, unspecified level: Secondary | ICD-10-CM | POA: Diagnosis not present

## 2016-03-15 DIAGNOSIS — M5412 Radiculopathy, cervical region: Secondary | ICD-10-CM | POA: Diagnosis not present

## 2016-03-15 DIAGNOSIS — M9901 Segmental and somatic dysfunction of cervical region: Secondary | ICD-10-CM | POA: Diagnosis not present

## 2016-03-15 DIAGNOSIS — M25511 Pain in right shoulder: Secondary | ICD-10-CM | POA: Diagnosis not present

## 2016-03-15 DIAGNOSIS — M4802 Spinal stenosis, cervical region: Secondary | ICD-10-CM | POA: Diagnosis not present

## 2016-03-15 DIAGNOSIS — M542 Cervicalgia: Secondary | ICD-10-CM | POA: Diagnosis not present

## 2016-03-16 DIAGNOSIS — M542 Cervicalgia: Secondary | ICD-10-CM | POA: Diagnosis not present

## 2016-03-17 DIAGNOSIS — J449 Chronic obstructive pulmonary disease, unspecified: Secondary | ICD-10-CM | POA: Diagnosis not present

## 2016-03-17 DIAGNOSIS — J208 Acute bronchitis due to other specified organisms: Secondary | ICD-10-CM | POA: Diagnosis not present

## 2016-03-17 DIAGNOSIS — R05 Cough: Secondary | ICD-10-CM | POA: Diagnosis not present

## 2016-03-20 DIAGNOSIS — M4802 Spinal stenosis, cervical region: Secondary | ICD-10-CM | POA: Diagnosis not present

## 2016-03-20 DIAGNOSIS — M5417 Radiculopathy, lumbosacral region: Secondary | ICD-10-CM | POA: Diagnosis not present

## 2016-03-20 DIAGNOSIS — M5412 Radiculopathy, cervical region: Secondary | ICD-10-CM | POA: Diagnosis not present

## 2016-03-20 DIAGNOSIS — M25511 Pain in right shoulder: Secondary | ICD-10-CM | POA: Diagnosis not present

## 2016-03-20 DIAGNOSIS — M545 Low back pain: Secondary | ICD-10-CM | POA: Diagnosis not present

## 2016-03-20 DIAGNOSIS — M5032 Other cervical disc degeneration, mid-cervical region, unspecified level: Secondary | ICD-10-CM | POA: Diagnosis not present

## 2016-03-20 DIAGNOSIS — M542 Cervicalgia: Secondary | ICD-10-CM | POA: Diagnosis not present

## 2016-03-20 DIAGNOSIS — M9901 Segmental and somatic dysfunction of cervical region: Secondary | ICD-10-CM | POA: Diagnosis not present

## 2016-03-22 DIAGNOSIS — M9901 Segmental and somatic dysfunction of cervical region: Secondary | ICD-10-CM | POA: Diagnosis not present

## 2016-03-22 DIAGNOSIS — M542 Cervicalgia: Secondary | ICD-10-CM | POA: Diagnosis not present

## 2016-03-22 DIAGNOSIS — M5032 Other cervical disc degeneration, mid-cervical region, unspecified level: Secondary | ICD-10-CM | POA: Diagnosis not present

## 2016-03-22 DIAGNOSIS — M5412 Radiculopathy, cervical region: Secondary | ICD-10-CM | POA: Diagnosis not present

## 2016-03-22 DIAGNOSIS — M545 Low back pain: Secondary | ICD-10-CM | POA: Diagnosis not present

## 2016-03-22 DIAGNOSIS — M5417 Radiculopathy, lumbosacral region: Secondary | ICD-10-CM | POA: Diagnosis not present

## 2016-03-22 DIAGNOSIS — M25511 Pain in right shoulder: Secondary | ICD-10-CM | POA: Diagnosis not present

## 2016-03-22 DIAGNOSIS — M4802 Spinal stenosis, cervical region: Secondary | ICD-10-CM | POA: Diagnosis not present

## 2016-03-28 DIAGNOSIS — M542 Cervicalgia: Secondary | ICD-10-CM | POA: Diagnosis not present

## 2016-04-03 DIAGNOSIS — M542 Cervicalgia: Secondary | ICD-10-CM | POA: Diagnosis not present

## 2016-04-04 DIAGNOSIS — M542 Cervicalgia: Secondary | ICD-10-CM | POA: Diagnosis not present

## 2016-04-10 DIAGNOSIS — M542 Cervicalgia: Secondary | ICD-10-CM | POA: Diagnosis not present

## 2016-04-11 DIAGNOSIS — M542 Cervicalgia: Secondary | ICD-10-CM | POA: Diagnosis not present

## 2016-04-20 DIAGNOSIS — R946 Abnormal results of thyroid function studies: Secondary | ICD-10-CM | POA: Diagnosis not present

## 2016-04-24 DIAGNOSIS — H6120 Impacted cerumen, unspecified ear: Secondary | ICD-10-CM | POA: Diagnosis not present

## 2016-04-24 DIAGNOSIS — I10 Essential (primary) hypertension: Secondary | ICD-10-CM | POA: Diagnosis not present

## 2016-04-24 DIAGNOSIS — M542 Cervicalgia: Secondary | ICD-10-CM | POA: Diagnosis not present

## 2016-04-24 DIAGNOSIS — E039 Hypothyroidism, unspecified: Secondary | ICD-10-CM | POA: Diagnosis not present

## 2016-04-24 DIAGNOSIS — F319 Bipolar disorder, unspecified: Secondary | ICD-10-CM | POA: Diagnosis not present

## 2016-04-24 DIAGNOSIS — Z79899 Other long term (current) drug therapy: Secondary | ICD-10-CM | POA: Diagnosis not present

## 2016-04-24 DIAGNOSIS — N390 Urinary tract infection, site not specified: Secondary | ICD-10-CM | POA: Diagnosis not present

## 2016-04-28 DIAGNOSIS — M542 Cervicalgia: Secondary | ICD-10-CM | POA: Diagnosis not present

## 2016-05-01 DIAGNOSIS — Z01419 Encounter for gynecological examination (general) (routine) without abnormal findings: Secondary | ICD-10-CM | POA: Diagnosis not present

## 2016-05-01 DIAGNOSIS — I10 Essential (primary) hypertension: Secondary | ICD-10-CM | POA: Diagnosis not present

## 2016-05-01 DIAGNOSIS — D751 Secondary polycythemia: Secondary | ICD-10-CM | POA: Diagnosis not present

## 2016-05-01 DIAGNOSIS — I2510 Atherosclerotic heart disease of native coronary artery without angina pectoris: Secondary | ICD-10-CM | POA: Diagnosis not present

## 2016-05-01 DIAGNOSIS — I671 Cerebral aneurysm, nonruptured: Secondary | ICD-10-CM | POA: Diagnosis not present

## 2016-05-01 DIAGNOSIS — E785 Hyperlipidemia, unspecified: Secondary | ICD-10-CM | POA: Diagnosis not present

## 2016-05-04 DIAGNOSIS — Z1231 Encounter for screening mammogram for malignant neoplasm of breast: Secondary | ICD-10-CM | POA: Diagnosis not present

## 2016-05-08 DIAGNOSIS — Z79899 Other long term (current) drug therapy: Secondary | ICD-10-CM | POA: Diagnosis not present

## 2016-05-08 DIAGNOSIS — I10 Essential (primary) hypertension: Secondary | ICD-10-CM | POA: Diagnosis not present

## 2016-05-08 DIAGNOSIS — J019 Acute sinusitis, unspecified: Secondary | ICD-10-CM | POA: Diagnosis not present

## 2016-05-08 DIAGNOSIS — R079 Chest pain, unspecified: Secondary | ICD-10-CM | POA: Diagnosis not present

## 2016-05-08 DIAGNOSIS — E559 Vitamin D deficiency, unspecified: Secondary | ICD-10-CM | POA: Diagnosis not present

## 2016-05-23 DIAGNOSIS — E785 Hyperlipidemia, unspecified: Secondary | ICD-10-CM | POA: Diagnosis not present

## 2016-05-23 DIAGNOSIS — F17200 Nicotine dependence, unspecified, uncomplicated: Secondary | ICD-10-CM | POA: Diagnosis not present

## 2016-05-23 DIAGNOSIS — I671 Cerebral aneurysm, nonruptured: Secondary | ICD-10-CM | POA: Diagnosis not present

## 2016-05-23 DIAGNOSIS — I10 Essential (primary) hypertension: Secondary | ICD-10-CM | POA: Diagnosis not present

## 2016-05-26 DIAGNOSIS — I671 Cerebral aneurysm, nonruptured: Secondary | ICD-10-CM | POA: Diagnosis not present

## 2016-05-31 DIAGNOSIS — D239 Other benign neoplasm of skin, unspecified: Secondary | ICD-10-CM | POA: Diagnosis not present

## 2016-06-05 DIAGNOSIS — R0789 Other chest pain: Secondary | ICD-10-CM | POA: Diagnosis not present

## 2016-06-05 DIAGNOSIS — I2510 Atherosclerotic heart disease of native coronary artery without angina pectoris: Secondary | ICD-10-CM | POA: Diagnosis not present

## 2016-06-05 DIAGNOSIS — R079 Chest pain, unspecified: Secondary | ICD-10-CM | POA: Diagnosis not present

## 2016-06-05 DIAGNOSIS — I10 Essential (primary) hypertension: Secondary | ICD-10-CM | POA: Diagnosis not present

## 2016-06-05 DIAGNOSIS — R51 Headache: Secondary | ICD-10-CM | POA: Diagnosis not present

## 2016-06-05 DIAGNOSIS — F419 Anxiety disorder, unspecified: Secondary | ICD-10-CM | POA: Diagnosis not present

## 2016-06-05 DIAGNOSIS — E785 Hyperlipidemia, unspecified: Secondary | ICD-10-CM | POA: Diagnosis not present

## 2016-06-06 DIAGNOSIS — I10 Essential (primary) hypertension: Secondary | ICD-10-CM | POA: Diagnosis present

## 2016-06-06 DIAGNOSIS — R1030 Lower abdominal pain, unspecified: Secondary | ICD-10-CM | POA: Diagnosis not present

## 2016-06-06 DIAGNOSIS — I6523 Occlusion and stenosis of bilateral carotid arteries: Secondary | ICD-10-CM | POA: Diagnosis not present

## 2016-06-06 DIAGNOSIS — R110 Nausea: Secondary | ICD-10-CM | POA: Diagnosis not present

## 2016-06-06 DIAGNOSIS — R1110 Vomiting, unspecified: Secondary | ICD-10-CM | POA: Diagnosis not present

## 2016-06-06 DIAGNOSIS — Z7982 Long term (current) use of aspirin: Secondary | ICD-10-CM | POA: Diagnosis not present

## 2016-06-06 DIAGNOSIS — I714 Abdominal aortic aneurysm, without rupture: Secondary | ICD-10-CM | POA: Diagnosis present

## 2016-06-06 DIAGNOSIS — R51 Headache: Secondary | ICD-10-CM | POA: Diagnosis present

## 2016-06-06 DIAGNOSIS — F17200 Nicotine dependence, unspecified, uncomplicated: Secondary | ICD-10-CM | POA: Diagnosis not present

## 2016-06-06 DIAGNOSIS — M545 Low back pain: Secondary | ICD-10-CM | POA: Diagnosis present

## 2016-06-06 DIAGNOSIS — E785 Hyperlipidemia, unspecified: Secondary | ICD-10-CM | POA: Diagnosis present

## 2016-06-06 DIAGNOSIS — K760 Fatty (change of) liver, not elsewhere classified: Secondary | ICD-10-CM | POA: Diagnosis present

## 2016-06-06 DIAGNOSIS — I252 Old myocardial infarction: Secondary | ICD-10-CM | POA: Diagnosis not present

## 2016-06-06 DIAGNOSIS — R079 Chest pain, unspecified: Secondary | ICD-10-CM | POA: Diagnosis present

## 2016-06-06 DIAGNOSIS — I2510 Atherosclerotic heart disease of native coronary artery without angina pectoris: Secondary | ICD-10-CM | POA: Diagnosis not present

## 2016-06-06 DIAGNOSIS — F17210 Nicotine dependence, cigarettes, uncomplicated: Secondary | ICD-10-CM | POA: Diagnosis present

## 2016-06-06 DIAGNOSIS — Z8679 Personal history of other diseases of the circulatory system: Secondary | ICD-10-CM | POA: Diagnosis not present

## 2016-06-06 DIAGNOSIS — R531 Weakness: Secondary | ICD-10-CM | POA: Diagnosis not present

## 2016-06-06 DIAGNOSIS — K5790 Diverticulosis of intestine, part unspecified, without perforation or abscess without bleeding: Secondary | ICD-10-CM | POA: Diagnosis present

## 2016-06-06 DIAGNOSIS — I6502 Occlusion and stenosis of left vertebral artery: Secondary | ICD-10-CM | POA: Diagnosis not present

## 2016-06-06 DIAGNOSIS — F419 Anxiety disorder, unspecified: Secondary | ICD-10-CM | POA: Diagnosis present

## 2016-06-06 DIAGNOSIS — G8929 Other chronic pain: Secondary | ICD-10-CM | POA: Diagnosis present

## 2016-06-15 DIAGNOSIS — I2510 Atherosclerotic heart disease of native coronary artery without angina pectoris: Secondary | ICD-10-CM | POA: Diagnosis not present

## 2016-06-15 DIAGNOSIS — D751 Secondary polycythemia: Secondary | ICD-10-CM | POA: Diagnosis not present

## 2016-06-15 DIAGNOSIS — I671 Cerebral aneurysm, nonruptured: Secondary | ICD-10-CM | POA: Diagnosis not present

## 2016-06-15 DIAGNOSIS — J449 Chronic obstructive pulmonary disease, unspecified: Secondary | ICD-10-CM | POA: Diagnosis not present

## 2016-06-15 DIAGNOSIS — J42 Unspecified chronic bronchitis: Secondary | ICD-10-CM | POA: Diagnosis not present

## 2016-06-15 DIAGNOSIS — E785 Hyperlipidemia, unspecified: Secondary | ICD-10-CM | POA: Diagnosis not present

## 2016-06-15 DIAGNOSIS — I10 Essential (primary) hypertension: Secondary | ICD-10-CM | POA: Diagnosis not present

## 2016-06-26 DIAGNOSIS — M545 Low back pain: Secondary | ICD-10-CM | POA: Diagnosis not present

## 2016-06-28 DIAGNOSIS — M48062 Spinal stenosis, lumbar region with neurogenic claudication: Secondary | ICD-10-CM | POA: Diagnosis not present

## 2016-06-28 DIAGNOSIS — I10 Essential (primary) hypertension: Secondary | ICD-10-CM | POA: Diagnosis not present

## 2016-06-28 DIAGNOSIS — G4700 Insomnia, unspecified: Secondary | ICD-10-CM | POA: Diagnosis not present

## 2016-06-28 DIAGNOSIS — H6120 Impacted cerumen, unspecified ear: Secondary | ICD-10-CM | POA: Diagnosis not present

## 2016-06-29 DIAGNOSIS — M545 Low back pain: Secondary | ICD-10-CM | POA: Diagnosis not present

## 2016-07-10 DIAGNOSIS — M545 Low back pain: Secondary | ICD-10-CM | POA: Diagnosis not present

## 2016-07-11 DIAGNOSIS — M545 Low back pain: Secondary | ICD-10-CM | POA: Diagnosis not present

## 2016-07-14 DIAGNOSIS — M545 Low back pain: Secondary | ICD-10-CM | POA: Diagnosis not present

## 2016-07-18 DIAGNOSIS — M545 Low back pain: Secondary | ICD-10-CM | POA: Diagnosis not present

## 2016-07-20 DIAGNOSIS — E039 Hypothyroidism, unspecified: Secondary | ICD-10-CM | POA: Diagnosis not present

## 2016-07-20 DIAGNOSIS — I10 Essential (primary) hypertension: Secondary | ICD-10-CM | POA: Diagnosis not present

## 2016-07-20 DIAGNOSIS — G4700 Insomnia, unspecified: Secondary | ICD-10-CM | POA: Diagnosis not present

## 2016-07-20 DIAGNOSIS — J449 Chronic obstructive pulmonary disease, unspecified: Secondary | ICD-10-CM | POA: Diagnosis not present

## 2016-08-03 DIAGNOSIS — Z7689 Persons encountering health services in other specified circumstances: Secondary | ICD-10-CM | POA: Diagnosis not present

## 2016-08-03 DIAGNOSIS — B370 Candidal stomatitis: Secondary | ICD-10-CM | POA: Diagnosis not present

## 2016-08-28 ENCOUNTER — Encounter (HOSPITAL_BASED_OUTPATIENT_CLINIC_OR_DEPARTMENT_OTHER): Payer: Self-pay | Admitting: Emergency Medicine

## 2016-08-28 ENCOUNTER — Emergency Department (HOSPITAL_BASED_OUTPATIENT_CLINIC_OR_DEPARTMENT_OTHER): Payer: Medicare Other

## 2016-08-28 ENCOUNTER — Emergency Department (HOSPITAL_BASED_OUTPATIENT_CLINIC_OR_DEPARTMENT_OTHER)
Admission: EM | Admit: 2016-08-28 | Discharge: 2016-08-29 | Disposition: A | Discharge status: Home / Self Care | Payer: Medicare Other | Attending: Emergency Medicine | Admitting: Emergency Medicine

## 2016-08-28 DIAGNOSIS — R4182 Altered mental status, unspecified: Secondary | ICD-10-CM | POA: Diagnosis not present

## 2016-08-28 DIAGNOSIS — F4329 Adjustment disorder with other symptoms: Secondary | ICD-10-CM | POA: Diagnosis not present

## 2016-08-28 DIAGNOSIS — F309 Manic episode, unspecified: Secondary | ICD-10-CM | POA: Insufficient documentation

## 2016-08-28 DIAGNOSIS — R079 Chest pain, unspecified: Secondary | ICD-10-CM | POA: Diagnosis present

## 2016-08-28 DIAGNOSIS — S9031XA Contusion of right foot, initial encounter: Secondary | ICD-10-CM | POA: Diagnosis not present

## 2016-08-28 DIAGNOSIS — S9001XA Contusion of right ankle, initial encounter: Secondary | ICD-10-CM | POA: Diagnosis not present

## 2016-08-28 DIAGNOSIS — M79671 Pain in right foot: Secondary | ICD-10-CM | POA: Insufficient documentation

## 2016-08-28 HISTORY — DX: Bipolar disorder, unspecified: F31.9

## 2016-08-28 LAB — RAPID URINE DRUG SCREEN, HOSP PERFORMED
Amphetamines: NOT DETECTED
BARBITURATES: NOT DETECTED
BENZODIAZEPINES: POSITIVE — AB
COCAINE: NOT DETECTED
Opiates: NOT DETECTED
TETRAHYDROCANNABINOL: NOT DETECTED

## 2016-08-28 LAB — CBC WITH DIFFERENTIAL/PLATELET
BASOS PCT: 1 %
Basophils Absolute: 0 10*3/uL (ref 0.0–0.1)
Eosinophils Absolute: 0.2 10*3/uL (ref 0.0–0.7)
Eosinophils Relative: 3 %
HEMATOCRIT: 38.9 % (ref 36.0–46.0)
HEMOGLOBIN: 12.7 g/dL (ref 12.0–15.0)
LYMPHS PCT: 30 %
Lymphs Abs: 1.7 10*3/uL (ref 0.7–4.0)
MCH: 28.7 pg (ref 26.0–34.0)
MCHC: 32.6 g/dL (ref 30.0–36.0)
MCV: 88 fL (ref 78.0–100.0)
MONOS PCT: 9 %
Monocytes Absolute: 0.5 10*3/uL (ref 0.1–1.0)
NEUTROS ABS: 3.3 10*3/uL (ref 1.7–7.7)
NEUTROS PCT: 57 %
Platelets: 179 10*3/uL (ref 150–400)
RBC: 4.42 MIL/uL (ref 3.87–5.11)
RDW: 14.9 % (ref 11.5–15.5)
WBC: 5.8 10*3/uL (ref 4.0–10.5)

## 2016-08-28 LAB — URINALYSIS, ROUTINE W REFLEX MICROSCOPIC
BILIRUBIN URINE: NEGATIVE
Glucose, UA: NEGATIVE mg/dL
Hgb urine dipstick: NEGATIVE
Ketones, ur: NEGATIVE mg/dL
NITRITE: NEGATIVE
PH: 5 (ref 5.0–8.0)
Protein, ur: NEGATIVE mg/dL
SPECIFIC GRAVITY, URINE: 1.013 (ref 1.005–1.030)

## 2016-08-28 LAB — COMPREHENSIVE METABOLIC PANEL
ALBUMIN: 4.3 g/dL (ref 3.5–5.0)
ALT: 20 U/L (ref 14–54)
AST: 37 U/L (ref 15–41)
Alkaline Phosphatase: 53 U/L (ref 38–126)
Anion gap: 9 (ref 5–15)
BUN: 16 mg/dL (ref 6–20)
CHLORIDE: 102 mmol/L (ref 101–111)
CO2: 26 mmol/L (ref 22–32)
Calcium: 9.4 mg/dL (ref 8.9–10.3)
Creatinine, Ser: 1.21 mg/dL — ABNORMAL HIGH (ref 0.44–1.00)
GFR calc Af Amer: 49 mL/min — ABNORMAL LOW (ref >60)
GFR, EST NON AFRICAN AMERICAN: 42 mL/min — AB (ref >60)
GLUCOSE: 116 mg/dL — AB (ref 65–99)
POTASSIUM: 3.7 mmol/L (ref 3.5–5.1)
SODIUM: 137 mmol/L (ref 135–145)
Total Bilirubin: 0.7 mg/dL (ref 0.3–1.2)
Total Protein: 7.1 g/dL (ref 6.5–8.1)

## 2016-08-28 LAB — TROPONIN I: Troponin I: <0.03 ng/mL (ref <0.03)

## 2016-08-28 LAB — URINALYSIS, MICROSCOPIC (REFLEX): RBC / HPF: NONE SEEN RBC/hpf (ref 0–5)

## 2016-08-28 LAB — ETHANOL

## 2016-08-28 LAB — TSH: TSH: 1.71 u[IU]/mL (ref 0.350–4.500)

## 2016-08-28 MED ORDER — ZOLPIDEM TARTRATE 10 MG PO TABS
10.0000 mg | ORAL_TABLET | Freq: Every evening | ORAL | Status: DC | PRN
  Filled 2016-08-28: qty 1

## 2016-08-28 MED ORDER — FOLIC ACID 1 MG PO TABS
1.0000 mg | ORAL_TABLET | Freq: Every day | ORAL | Status: DC
  Administered 2016-08-28 – 2016-08-29 (×2): 1 mg via ORAL
  Filled 2016-08-28 (×2): qty 1

## 2016-08-28 MED ORDER — POTASSIUM CHLORIDE CRYS ER 20 MEQ PO TBCR
20.0000 meq | EXTENDED_RELEASE_TABLET | Freq: Two times a day (BID) | ORAL | Status: DC
  Administered 2016-08-28 – 2016-08-29 (×2): 20 meq via ORAL
  Filled 2016-08-28 (×2): qty 1

## 2016-08-28 MED ORDER — HALOPERIDOL LACTATE 5 MG/ML IJ SOLN
INTRAMUSCULAR | Status: AC
  Administered 2016-08-28: 5 mg via INTRAMUSCULAR
  Filled 2016-08-28: qty 1

## 2016-08-28 MED ORDER — PANTOPRAZOLE SODIUM 40 MG PO TBEC
40.0000 mg | DELAYED_RELEASE_TABLET | Freq: Two times a day (BID) | ORAL | Status: DC
  Administered 2016-08-29: 40 mg via ORAL
  Filled 2016-08-28 (×2): qty 1

## 2016-08-28 MED ORDER — LORAZEPAM 1 MG PO TABS
1.0000 mg | ORAL_TABLET | Freq: Three times a day (TID) | ORAL | Status: DC | PRN
Start: 2016-08-28 — End: 2016-08-29
  Filled 2016-08-28: qty 1

## 2016-08-28 MED ORDER — HALOPERIDOL LACTATE 5 MG/ML IJ SOLN
5.0000 mg | Freq: Once | INTRAMUSCULAR | Status: AC
  Administered 2016-08-28: 5 mg via INTRAMUSCULAR

## 2016-08-28 MED ORDER — DOXYCYCLINE HYCLATE 100 MG PO TABS
100.0000 mg | ORAL_TABLET | Freq: Two times a day (BID) | ORAL | Status: DC
  Administered 2016-08-28 – 2016-08-29 (×2): 100 mg via ORAL
  Filled 2016-08-28 (×2): qty 1

## 2016-08-28 MED ORDER — TRIAMTERENE-HCTZ 37.5-25 MG PO TABS
1.0000 | ORAL_TABLET | Freq: Two times a day (BID) | ORAL | Status: DC
  Administered 2016-08-29 (×2): 1 via ORAL
  Filled 2016-08-28 (×2): qty 1

## 2016-08-28 MED ORDER — ALPRAZOLAM 0.5 MG PO TABS
0.5000 mg | ORAL_TABLET | Freq: Once | ORAL | Status: AC
  Administered 2016-08-28: 0.5 mg via ORAL
  Filled 2016-08-28: qty 1

## 2016-08-28 MED ORDER — LEVOTHYROXINE SODIUM 50 MCG PO TABS
50.0000 ug | ORAL_TABLET | Freq: Every day | ORAL | Status: DC
  Administered 2016-08-29: 50 ug via ORAL
  Filled 2016-08-28 (×2): qty 1

## 2016-08-28 MED ORDER — ZOLPIDEM TARTRATE 5 MG PO TABS
5.0000 mg | ORAL_TABLET | Freq: Every evening | ORAL | Status: DC | PRN
  Administered 2016-08-28: 5 mg via ORAL
  Filled 2016-08-28: qty 1

## 2016-08-28 MED ORDER — TRIAMTERENE-HCTZ 37.5-25 MG PO CAPS
1.0000 | ORAL_CAPSULE | Freq: Two times a day (BID) | ORAL | Status: DC
  Filled 2016-08-28: qty 1

## 2016-08-28 MED ORDER — ALPRAZOLAM 0.5 MG PO TABS
0.5000 mg | ORAL_TABLET | Freq: Two times a day (BID) | ORAL | Status: DC | PRN
  Administered 2016-08-28: 0.5 mg via ORAL
  Filled 2016-08-28: qty 1

## 2016-08-28 MED ORDER — LORAZEPAM 2 MG/ML IJ SOLN
1.0000 mg | Freq: Once | INTRAMUSCULAR | Status: AC
  Administered 2016-08-28: 1 mg via INTRAMUSCULAR
  Filled 2016-08-28: qty 1

## 2016-08-28 NOTE — ED Notes (Addendum)
Pt talking with TTS

## 2016-08-28 NOTE — ED Notes (Signed)
Given meal tray, sitter at bedside

## 2016-08-28 NOTE — ED Notes (Signed)
Pt seems be more calm at this time with NT at bedside

## 2016-08-28 NOTE — ED Notes (Signed)
Pt eating spaghetti with  Two grape juices in her our drinking cup.

## 2016-08-28 NOTE — ED Notes (Addendum)
Pt becomes hostile, starts walking toward door. Pt is extremely confused, dangerous to herself and other. IVC papers in route ordered by Dr Ayesha Rumpf. Security and off duty officer assisting with moving patient back to room.

## 2016-08-28 NOTE — ED Notes (Signed)
Agitated, demanding to be sent to [facility], conversation "scattered" . ED MD in to see

## 2016-08-28 NOTE — ED Notes (Signed)
Spoke with Marzetta Board, charge nurse at [facility] about Dr. Ralene Bathe request to transfer pt to [facility]. States she will call back with answer

## 2016-08-28 NOTE — ED Notes (Addendum)
Pt reports chest pain and right foot injury, pt is uncooperative with staff, refusing ECG testing and labs. Pt states, "forget the chest pain, I dont have chest pain anymore, I want you to look at my foot ." pt appears to be confused yet oriented to person, place and time. Spoke to [place] , family member. [name] drooped off patient at the ED. [name] reports that patient may been treated for dementia at [place] where she is originally from. [name] will call patient's daughter and have her come to Ed. Daughter is the health care power of attorney .  [name], family member, [phone]

## 2016-08-28 NOTE — ED Notes (Signed)
Pt refused blood drawn, EKG, and to provide urine sample. RN Baxter Hire.

## 2016-08-28 NOTE — ED Notes (Signed)
Rt foot noted to be discolored and some swelling noted.

## 2016-08-28 NOTE — ED Notes (Signed)
Pt had two frozen dinners mac& cheese and meatloaf, had two ginger ale drinks, two grape juices and ice.

## 2016-08-28 NOTE — ED Notes (Signed)
Pt speaking with TTS at this time.

## 2016-08-28 NOTE — ED Notes (Signed)
Staffing office called for sitter, does not have a available sitter but will call if one becomes available.

## 2016-08-28 NOTE — ED Notes (Signed)
Pt transferred from [facility], presents with complaint of psychotic behavior, pressured speech,  Pt awake, alert & responsive, no distress noted. Swelling & pain noted to Left hand and right foot.  Denies SI, HI or AVH.  Monitoring for safety, Q 15 min checks in effect.  Safety check for contraband completed. No items found.

## 2016-08-28 NOTE — ED Notes (Signed)
RN Legrand Como gave Pt two grape juices and two warm blankets

## 2016-08-28 NOTE — ED Provider Notes (Addendum)
Patient accepted by me from [facility].. History of bipolar disorder patient was reportedly knocking on everybody store in her apartment complex not sleeping. Patient denies coughor shortness of breath. She does complain of left ankle painas result of tripping and falling recently. She is alert Glasgow Coma Score 15. Oriented 3.HEENT exam normocephalic atraumatic. No facial asymmetry Lungs clear auscultation heart regular rate and rhythm. Right lower extremity minimally swollen and tender at ankle and ecchymotic  Proximal foot. DP pulse 2+ good capillary refill. All other extremities or contusion abrasion or tenderness neurovascular intact. She walks without limp moves all extremity as well cranial nerves II through XII grossly intact. Results for orders placed or performed during the hospital encounter of 08/28/16  Comprehensive metabolic panel  Result Value Ref Range   Sodium 137 135 - 145 mmol/L   Potassium 3.7 3.5 - 5.1 mmol/L   Chloride 102 101 - 111 mmol/L   CO2 26 22 - 32 mmol/L   Glucose, Bld 116 (H) 65 - 99 mg/dL   BUN 16 6 - 20 mg/dL   Creatinine, Ser 1.21 (H) 0.44 - 1.00 mg/dL   Calcium 9.4 8.9 - 10.3 mg/dL   Total Protein 7.1 6.5 - 8.1 g/dL   Albumin 4.3 3.5 - 5.0 g/dL   AST 37 15 - 41 U/L   ALT 20 14 - 54 U/L   Alkaline Phosphatase 53 38 - 126 U/L   Total Bilirubin 0.7 0.3 - 1.2 mg/dL   GFR calc non Af Amer 42 (L) >60 mL/min   GFR calc Af Amer 49 (L) >60 mL/min   Anion gap 9 5 - 15  Ethanol  Result Value Ref Range   Alcohol, Ethyl (B) <5 <5 mg/dL  Troponin I  Result Value Ref Range   Troponin I <0.03 <0.03 ng/mL  CBC with Differential  Result Value Ref Range   WBC 5.8 4.0 - 10.5 K/uL   RBC 4.42 3.87 - 5.11 MIL/uL   Hemoglobin 12.7 12.0 - 15.0 g/dL   HCT 38.9 36.0 - 46.0 %   MCV 88.0 78.0 - 100.0 fL   MCH 28.7 26.0 - 34.0 pg   MCHC 32.6 30.0 - 36.0 g/dL   RDW 14.9 11.5 - 15.5 %   Platelets 179 150 - 400 K/uL   Neutrophils Relative % 57 %   Neutro Abs 3.3  1.7 - 7.7 K/uL   Lymphocytes Relative 30 %   Lymphs Abs 1.7 0.7 - 4.0 K/uL   Monocytes Relative 9 %   Monocytes Absolute 0.5 0.1 - 1.0 K/uL   Eosinophils Relative 3 %   Eosinophils Absolute 0.2 0.0 - 0.7 K/uL   Basophils Relative 1 %   Basophils Absolute 0.0 0.0 - 0.1 K/uL  Urinalysis, Routine w reflex microscopic  Result Value Ref Range   Color, Urine YELLOW YELLOW   APPearance CLEAR CLEAR   Specific Gravity, Urine 1.013 1.005 - 1.030   pH 5.0 5.0 - 8.0   Glucose, UA NEGATIVE NEGATIVE mg/dL   Hgb urine dipstick NEGATIVE NEGATIVE   Bilirubin Urine NEGATIVE NEGATIVE   Ketones, ur NEGATIVE NEGATIVE mg/dL   Protein, ur NEGATIVE NEGATIVE mg/dL   Nitrite NEGATIVE NEGATIVE   Leukocytes, UA TRACE (A) NEGATIVE  Urine rapid drug screen (hosp performed)  Result Value Ref Range   Opiates NONE DETECTED NONE DETECTED   Cocaine NONE DETECTED NONE DETECTED   Benzodiazepines POSITIVE (A) NONE DETECTED   Amphetamines NONE DETECTED NONE DETECTED   Tetrahydrocannabinol NONE DETECTED NONE  DETECTED   Barbiturates NONE DETECTED NONE DETECTED  Urinalysis, Microscopic (reflex)  Result Value Ref Range   RBC / HPF NONE SEEN 0 - 5 RBC/hpf   WBC, UA 0-5 0 - 5 WBC/hpf   Bacteria, UA RARE (A) NONE SEEN   Squamous Epithelial / LPF 0-5 (A) NONE SEEN   Hyaline Casts, UA PRESENT    Granular Casts, UA PRESENT    Dg Chest 2 View  Result Date: 08/28/2016 CLINICAL DATA:  Altered mental status. EXAM: CHEST  2 VIEW COMPARISON:  No recent prior. FINDINGS: Mediastinum and hilar structures normal. Mild right base infiltrate. Mild bibasilar subsegmental atelectasis. No pleural effusion or pneumothorax. Heart size normal. No pneumothorax . IMPRESSION: Mild right base infiltrate consistent with pneumonia. Mild bibasilar subsegmental atelectasis . Electronically Signed   By: Marcello Moores  Register   On: 08/28/2016 15:44   Dg Ankle Complete Right  Result Date: 08/28/2016 CLINICAL DATA:  75 y/o F; right foot and ankle  swelling with bruising. EXAM: RIGHT FOOT COMPLETE - 3+ VIEW; RIGHT ANKLE - COMPLETE 3+ VIEW COMPARISON:  None. FINDINGS: Right foot: There is no evidence of fracture or dislocation. Lisfranc alignment is maintained. Dorsal calcaneal enthesophyte. Osteophyte of the talar neck may represent anterior ankle impingement in the appropriate clinical setting. Right ankle: No acute fracture or dislocation identified. Talar dome is intact. Ankle mortise is symmetric on these nonstress views. Well corticated ossific density inferior to the medial malleolus probably represents sequelae of old trauma. The osteophytes at the talar neck may represent anterior ankle impingement in the appropriate clinical setting. Small dorsal calcaneal enthesophyte. IMPRESSION: 1. No acute fracture or dislocation identified. 2. Small dorsal calcaneal enthesophyte. 3. Osteophyte of talar neck may represent anterior ankle impingement in the appropriate clinical setting. Electronically Signed   By: Kristine Garbe M.D.   On: 08/28/2016 15:48   Ct Head Wo Contrast  Result Date: 08/28/2016 CLINICAL DATA:  Altered mental status. EXAM: CT HEAD WITHOUT CONTRAST TECHNIQUE: Contiguous axial images were obtained from the base of the skull through the vertex without intravenous contrast. COMPARISON:  None. FINDINGS: Brain: No evidence of acute infarction, hemorrhage, hydrocephalus, extra-axial collection or mass lesion/mass effect. 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 No hyperdense vessel. Intracranial atherosclerotic vascular calcifications. Skull: Normal. Negative for fracture or focal lesion. Sinuses/Orbits: The bilateral paranasal sinuses and mastoid air cells are clear. The orbits are unremarkable. Other: None. IMPRESSION: 1. No acute intracranial abnormality. 2. 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 Electronically Signed   By: Titus Dubin M.D.   On: 08/28/2016 15:55   Dg Foot Complete Right  Result Date: 08/28/2016 CLINICAL DATA:  76 y/o F; right foot and ankle swelling with bruising. EXAM: RIGHT FOOT COMPLETE - 3+ VIEW; RIGHT ANKLE - COMPLETE 3+ VIEW COMPARISON:  None. FINDINGS: Right foot: There is no evidence of fracture or dislocation. Lisfranc alignment is maintained. Dorsal calcaneal enthesophyte. Osteophyte of the talar neck may represent anterior ankle impingement in the appropriate clinical setting. Right ankle: No acute fracture or dislocation identified. Talar dome is intact. Ankle mortise is symmetric on these nonstress views. Well corticated ossific density inferior to the medial malleolus probably represents sequelae of old trauma. The osteophytes at the talar neck may represent anterior ankle impingement in the appropriate clinical setting. Small dorsal calcaneal enthesophyte. IMPRESSION: 1. No acute  fracture or dislocation identified. 2. Small dorsal calcaneal enthesophyte. 3. Osteophyte of talar neck may represent anterior ankle impingement in the appropriate clinical setting. Electronically Signed   By: Kristine Garbe M.D.   On: 08/28/2016 15:48  I'm not convinced clinically the patient has pneumonia However X-ray possible consistent with pneumonia. Treated with doxycycline. She is medically cleared for psychiatric evaluation  X-rays viewed by me Orlie Dakin, MD 08/28/16 7897    Orlie Dakin, MD 08/28/16 [phone]

## 2016-08-28 NOTE — ED Notes (Signed)
Went with xray tech and assisted them with the xray and CT, Pt was cooperative.

## 2016-08-28 NOTE — BH Assessment (Signed)
  Zerita Boers, NP recommends inpatient psychiatric treatment. TTS to look for gero-psych placement.

## 2016-08-28 NOTE — ED Triage Notes (Signed)
Pt is unable to answer questions due to manic behavior. Talking non stop about issues not related to chief complaint of chest pain.
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
## 2016-08-28 NOTE — ED Notes (Signed)
EMT Lilia Pro and RN Sophie assisted in EKG and getting blood and getting urine sample

## 2016-08-28 NOTE — ED Notes (Signed)
Pt sleeping. 

## 2016-08-28 NOTE — ED Notes (Signed)
Drowsy, but remains confused . Sitter remains 1:1

## 2016-08-28 NOTE — ED Notes (Signed)
Patient was instructed to change for an EKG to be done because of her chief complaint being chest pain. Patient ignored instructions and refused to let the EKG to be done at this time.

## 2016-08-28 NOTE — ED Notes (Signed)
Spoke with Caryl Pina from TTS. She is attempting to contact pt's daughter at this time

## 2016-08-28 NOTE — ED Notes (Signed)
Pt has a new sitter staying in room with her, RN Faythe Dingwall was informed.
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
## 2016-08-28 NOTE — ED Notes (Signed)
Pt states she is leaving and going to [facility], being verbally abusive to all staff members, pt also demonstrating Manic Type behavior. Demanding something to drink, through ice pack at nursing staff. EDP aware.

## 2016-08-28 NOTE — ED Notes (Signed)
Pt placed back in exam room 2, safety measures immediately implemented, NT remaining at bedside, sr x 2 up. Pt placed in comfortable position, ice pack applied to rt foot, and elevated on one pillow. Pt provided OJ at her request.

## 2016-08-28 NOTE — ED Notes (Signed)
Pt cont to uncooperative. Pt cont to have rambling speech.

## 2016-08-29 DIAGNOSIS — F4329 Adjustment disorder with other symptoms: Secondary | ICD-10-CM

## 2016-08-29 DIAGNOSIS — F309 Manic episode, unspecified: Secondary | ICD-10-CM | POA: Diagnosis not present

## 2016-08-29 NOTE — ED Notes (Signed)
Pt A/O, no note distress. Denies pain. Educated pt on discharge instruction. Pt has all belongings. Staff walked with pt to lobby

## 2016-08-29 NOTE — BH Assessment (Signed)
[facility] Assessment Progress Note  Per Corena Pilgrim, MD, this pt does not require psychiatric hospitalization at this time.  Pt presents under IVC initiated by EDP Quintella Reichert, MD, which Dr Darleene Cleaver has rescinded.  Pt is to be discharged from [facility].  She does not need outpatient referrals at this time.  Pt's nurse has been notified.  Jalene Mullet, [place] Triage Specialist [phone]
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
## 2016-08-29 NOTE — ED Notes (Signed)
PLAN: Pt will be discharge to home.

## 2016-08-29 NOTE — [facility] Suicide Risk Assessment (Signed)
Suicide Risk Assessment     [facility] Discharge Suicide Risk Assessment   Principal Problem: Adjustment disorder with disturbance of emotion Discharge Diagnoses:  Patient Active Problem List   Diagnosis Date Noted  . Adjustment disorder with disturbance of emotion [F43.29] 08/29/2016    Total Time spent with patient: 30 minutes  Musculoskeletal: Strength & Muscle Tone: within normal limits Gait & Station: unsteady due to a twisted ankle after a standing height trip and fall Patient leans: N/A  Psychiatric Specialty Exam:   Blood pressure (!) 130/50, pulse 72, temperature 98.6 F (37 C), temperature source Oral, resp. rate 16, SpO2 97 %.There is no height or weight on file to calculate BMI.  General Appearance: Casual  Eye Contact::  Good  Speech:  Clear and Coherent  Volume:  Normal  Mood:  Euthymic  Affect:  Congruent  Thought Process:  Coherent  Orientation:  Full (Time, Place, and Person)  Thought Content:  Hallucinations: None  Suicidal Thoughts:  No  Homicidal Thoughts:  No  Memory:  Immediate;   Good Recent;   Good Remote;   Fair  Judgement:  Intact  Insight:  Present  Psychomotor Activity:  Normal  Concentration:  Fair  Recall:  [employer] of Knowledge:Fair  Language: Fair  Akathisia:  No  Handed:  Right  AIMS (if indicated):     Assets:  Communication Skills Desire for Improvement Resilience Social Support  Sleep:     Cognition: WNL  ADL's:  Intact   Mental Status Per Nursing Assessment::   On Admission:     Demographic Factors:  Age 76 or older, Divorced or widowed, Caucasian and Living alone  Loss Factors: Decline in physical health  Historical Factors: NA  Risk Reduction Factors:   Positive social support and Positive therapeutic relationship   Cognitive Features That Contribute To Risk:  None    Suicide Risk:  Minimal: No identifiable suicidal ideation.  Patients presenting with no risk factors but with morbid ruminations; may be  classified as minimal risk based on the severity of the depressive symptoms    Plan Of Care/Follow-up recommendations:  Activity:  as tolerated Diet:  heart heathy  Derrill Center, NP 08/29/2016, 11:17 AM

## 2016-08-31 LAB — T4: T4, Total: 7.7 ug/dL (ref 4.5–12.0)

## 2016-09-01 DIAGNOSIS — Z880 Allergy status to penicillin: Secondary | ICD-10-CM | POA: Diagnosis not present

## 2016-09-01 DIAGNOSIS — Z9114 Patient's other noncompliance with medication regimen: Secondary | ICD-10-CM | POA: Diagnosis not present

## 2016-09-01 DIAGNOSIS — F3189 Other bipolar disorder: Secondary | ICD-10-CM | POA: Diagnosis present

## 2016-09-01 DIAGNOSIS — F312 Bipolar disorder, current episode manic severe with psychotic features: Secondary | ICD-10-CM | POA: Diagnosis not present

## 2016-09-01 DIAGNOSIS — S9031XA Contusion of right foot, initial encounter: Secondary | ICD-10-CM | POA: Diagnosis not present

## 2016-09-01 DIAGNOSIS — I10 Essential (primary) hypertension: Secondary | ICD-10-CM | POA: Diagnosis present

## 2016-09-01 DIAGNOSIS — M25771 Osteophyte, right ankle: Secondary | ICD-10-CM | POA: Diagnosis present

## 2016-09-01 DIAGNOSIS — Z7982 Long term (current) use of aspirin: Secondary | ICD-10-CM | POA: Diagnosis not present

## 2016-09-01 DIAGNOSIS — R930 Abnormal findings on diagnostic imaging of skull and head, not elsewhere classified: Secondary | ICD-10-CM | POA: Diagnosis not present

## 2016-09-01 DIAGNOSIS — F22 Delusional disorders: Secondary | ICD-10-CM | POA: Diagnosis not present

## 2016-09-01 DIAGNOSIS — Z79899 Other long term (current) drug therapy: Secondary | ICD-10-CM | POA: Diagnosis not present

## 2016-09-01 DIAGNOSIS — R4182 Altered mental status, unspecified: Secondary | ICD-10-CM | POA: Diagnosis not present

## 2016-09-01 DIAGNOSIS — G238 Other specified degenerative diseases of basal ganglia: Secondary | ICD-10-CM | POA: Diagnosis not present

## 2016-09-01 DIAGNOSIS — I2510 Atherosclerotic heart disease of native coronary artery without angina pectoris: Secondary | ICD-10-CM | POA: Diagnosis present

## 2016-09-01 DIAGNOSIS — M79671 Pain in right foot: Secondary | ICD-10-CM | POA: Diagnosis not present

## 2016-09-01 DIAGNOSIS — K219 Gastro-esophageal reflux disease without esophagitis: Secondary | ICD-10-CM | POA: Diagnosis present

## 2016-09-01 DIAGNOSIS — M7731 Calcaneal spur, right foot: Secondary | ICD-10-CM | POA: Diagnosis not present

## 2016-09-01 DIAGNOSIS — F302 Manic episode, severe with psychotic symptoms: Secondary | ICD-10-CM | POA: Diagnosis not present

## 2016-09-01 DIAGNOSIS — M7989 Other specified soft tissue disorders: Secondary | ICD-10-CM | POA: Diagnosis not present

## 2016-09-01 DIAGNOSIS — M479 Spondylosis, unspecified: Secondary | ICD-10-CM | POA: Diagnosis present

## 2016-09-01 DIAGNOSIS — Z008 Encounter for other general examination: Secondary | ICD-10-CM | POA: Diagnosis not present

## 2016-09-01 DIAGNOSIS — F17210 Nicotine dependence, cigarettes, uncomplicated: Secondary | ICD-10-CM | POA: Diagnosis not present

## 2016-09-14 DIAGNOSIS — M19071 Primary osteoarthritis, right ankle and foot: Secondary | ICD-10-CM | POA: Diagnosis not present

## 2016-09-14 DIAGNOSIS — F312 Bipolar disorder, current episode manic severe with psychotic features: Secondary | ICD-10-CM | POA: Diagnosis not present

## 2016-09-14 DIAGNOSIS — M79671 Pain in right foot: Secondary | ICD-10-CM | POA: Diagnosis not present

## 2016-09-14 DIAGNOSIS — J029 Acute pharyngitis, unspecified: Secondary | ICD-10-CM | POA: Diagnosis not present

## 2016-09-14 DIAGNOSIS — R300 Dysuria: Secondary | ICD-10-CM | POA: Diagnosis not present

## 2016-09-14 DIAGNOSIS — M7731 Calcaneal spur, right foot: Secondary | ICD-10-CM | POA: Diagnosis not present

## 2016-09-21 ENCOUNTER — Encounter: Payer: Self-pay | Admitting: Osteopathic Medicine

## 2016-09-21 ENCOUNTER — Ambulatory Visit (INDEPENDENT_AMBULATORY_CARE_PROVIDER_SITE_OTHER): Payer: Medicare Other | Admitting: Osteopathic Medicine

## 2016-09-21 ENCOUNTER — Other Ambulatory Visit: Payer: Self-pay | Admitting: Osteopathic Medicine

## 2016-09-21 VITALS — BP 136/72 | HR 80 | Ht 64.0 in | Wt 141.0 lb

## 2016-09-21 DIAGNOSIS — F419 Anxiety disorder, unspecified: Secondary | ICD-10-CM | POA: Diagnosis not present

## 2016-09-21 DIAGNOSIS — F99 Mental disorder, not otherwise specified: Secondary | ICD-10-CM

## 2016-09-21 DIAGNOSIS — F3177 Bipolar disorder, in partial remission, most recent episode mixed: Secondary | ICD-10-CM

## 2016-09-21 DIAGNOSIS — Z8679 Personal history of other diseases of the circulatory system: Secondary | ICD-10-CM

## 2016-09-21 DIAGNOSIS — Z720 Tobacco use: Secondary | ICD-10-CM | POA: Diagnosis not present

## 2016-09-21 DIAGNOSIS — F5105 Insomnia due to other mental disorder: Secondary | ICD-10-CM

## 2016-09-21 DIAGNOSIS — J42 Unspecified chronic bronchitis: Secondary | ICD-10-CM

## 2016-09-21 DIAGNOSIS — R413 Other amnesia: Secondary | ICD-10-CM

## 2016-09-21 DIAGNOSIS — M62838 Other muscle spasm: Secondary | ICD-10-CM | POA: Diagnosis not present

## 2016-09-21 HISTORY — DX: Unspecified chronic bronchitis: J42

## 2016-09-21 MED ORDER — CYCLOBENZAPRINE HCL 10 MG PO TABS: 10.0000 mg | ORAL_TABLET | Freq: Three times a day (TID) | ORAL | 1 refills | Status: DC | PRN

## 2016-09-21 MED ORDER — TRAZODONE HCL 100 MG PO TABS: 100.0000 mg | ORAL_TABLET | Freq: Every day | ORAL | 1 refills | Status: DC

## 2016-09-21 MED ORDER — ALPRAZOLAM 0.5 MG PO TABS: 0.5000 mg | ORAL_TABLET | Freq: Two times a day (BID) | ORAL | 0 refills | Status: DC | PRN

## 2016-09-21 NOTE — Patient Instructions (Addendum)
[facility]) [address] [place], [place] [address] [phone] Fax: [phone] Appointment: Oct 12, 2016 @ 8:15AM

## 2016-09-21 NOTE — Progress Notes (Signed)
HPI: Anita Krause is a 76 y.o. female  who presents to [place] today, 09/21/16,  for chief complaint of:  Chief Complaint  Patient presents with  . Establish Care    RIGHT ANKLE PAIN   New to establish care.  Recently relocated from [place]  Recently admitted to psychiatric hospital at [facility] - records reviewed and summarized as below.  Accompanied today by daughter, [name].  Psychiatric situation - history of bipolar and recent noncompliance with medications caused manic episode which had her admitted to the hospital. Stable and doing fairly well now but complains of insomnia. Daughter states that she is very dissatisfied with how the discharge process occurred, she met the patient out on the sidewalk, was not on the unit/present when discharge medications were reviewed. As a result, patient is back on several medications which her psychiatrists in the hospital had actually discontinued, including Xanax. They're also given a refill of Xanax by urgent care recently, enough to tie her over to this appointment.  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  Started on:   Lithium ER 300 mg q 12 h   Seroquel 50 mg qhs  Trazodone  mg qhs prn   Pantoprazole 40 mg daily   ASA 325 mg daily  Triamterene-HCTZ 37.5-25 mg daily  Stopped: Ambien, Omeprzole, Alprazolam    Appointments which have been scheduled:  [facility]) Appointment: Oct 12, 2016 @ 8:15AM with Dr. Kenton Kingf[facility] Phone: [phone] 09/13/16 A referral was made to [facility], please follow up with at discharge to see if you meet criteria for ACT services.    Past medical, surgical, social and family history reviewed: Patient Active Problem List   Diagnosis Date Noted  . Adjustment disorder with disturbance of emotion 08/29/2016   No past  surgical history on file. Social History  Substance Use Topics  . Smoking status: Unknown If Ever Smoked  . Smokeless tobacco: Not on file  . Alcohol use Not on file   No family history on file.   Current medication list and allergy/intolerance information reviewed:   Current Outpatient Prescriptions  Medication Sig Dispense Refill  . albuterol (PROVENTIL HFA;VENTOLIN HFA) 108 (90 Base) MCG/ACT inhaler Inhale 2 puffs into the lungs every 4 (four) hours as needed for wheezing or shortness of breath.    . ALPRAZolam (XANAX) 0.5 MG tablet Take 0.5 mg by mouth 2 (two) times daily as needed for anxiety.     . cyclobenzaprine (FLEXERIL) 10 MG tablet Take 10 mg by mouth 3 (three) times daily as needed for muscle spasms.     . folic acid (FOLVITE) 1 MG tablet Take 1 mg by mouth daily.    Marland Kitchen levothyroxine (SYNTHROID, LEVOTHROID) 50 MCG tablet Take 50 mcg by mouth daily before breakfast.    . pantoprazole (PROTONIX) 40 MG tablet Take 40 mg by mouth 2 (two) times daily.    . potassium chloride SA (K-DUR,KLOR-CON) 20 MEQ tablet Take 20 mEq by mouth 2 (two) times daily.    Marland Kitchen thyroid (ARMOUR) 32.5 MG tablet Take 32.5 mg by mouth daily.    Marland Kitchen triamterene-hydrochlorothiazide (DYAZIDE) 37.5-25 MG capsule Take 1 capsule by mouth 2 (two) times daily.    Marland Kitchen umeclidinium bromide (INCRUSE ELLIPTA) 62.5 MCG/INH AEPB Inhale 1 puff into the lungs daily.    Marland Kitchen zolpidem (AMBIEN) 10 MG tablet Take 10 mg by mouth at bedtime as needed  for sleep.     No current facility-administered medications for this visit.    Allergies not on file    Review of Systems:  Constitutional:  No  fever, no chills, No recent illness, No unintentional weight changes. No significant fatigue.   HEENT: No  headache, no vision change, no hearing change, No sore throat, No  sinus pressure  Cardiac: No  chest pain, No  pressure, No palpitations, No  Orthopnea  Respiratory:  No  shortness of breath. No  Cough  Gastrointestinal: No   abdominal pain, No  nausea, No  vomiting,  No  blood in stool, No  diarrhea, No  constipation   Musculoskeletal: No new myalgia/arthralgia  Genitourinary: No  incontinence, No  abnormal genital bleeding, No abnormal genital discharge  Skin: No  Rash, No other wounds/concerning lesions  Hem/Onc: No  easy bruising/bleeding, No  abnormal lymph node  Endocrine: No cold intolerance,  No heat intolerance. No polyuria/polydipsia/polyphagia   Neurologic: No  weakness, No  dizziness, No  slurred speech/focal weakness/facial droop  Psychiatric: +concerns with depression, +concerns with anxiety, +sleep problems, No mood problems  Exam:  BP 136/72   Pulse 80   Ht _0  (1.626 m)   Wt 141 lb (64 kg)   BMI 24.20 kg/m   Constitutional: VS see above. General Appearance: alert, well-developed, well-nourished, NAD  Eyes: Normal lids and conjunctive, non-icteric sclera  Ears, Nose, Mouth, Throat: MMM, Normal external inspection ears/nares/mouth/lips/gums.   Neck: No masses, trachea midline. No thyroid enlargement.   Respiratory: Normal respiratory effort. no wheeze, no rhonchi, no rales  Cardiovascular: S1/S2 normal, faint systolic murmur, no rub/gallop auscultated. RRR. No lower extremity edema.   Gastrointestinal: Nontender, no masses.   Musculoskeletal: Gait normal. No clubbing/cyanosis of digits.   Neurological: Normal balance/coordination. No tremor.   Skin: warm, dry, intact. No rash/ulcer.   Psychiatric: Normal judgment/insight. Normal mood and affect. Oriented x3.    ASSESSMENT/PLAN:  I am put in a tough situation given the discrepancy between the patient's discharge medications and the fact that urgent care recently refilled benzodiazepines that she has been on for years.   Likely dependence and the fact that she has been taking them since her hospital discharge, I'm going to go ahead and refill the alprazolam until she can get in to be seen by psychiatry -  to set the  stage for that visit, I did mention that in her hospitalization records it mentioned that she should not have been taking this medication, but it sounds like they did not get a proper discharge review of medicines with a nurse or other clinician.   Patient states that if there is something that can be done about her insomnia and anxiety, she thinks that she would be okay to get off the alprazolam but she is very nervous to stop this medication since it has been quite helpful of course for her symptoms.  We'll get basic labs today  Bipolar 1 disorder, mixed, partial remission ([place]) - Plan: lithium carbonate (LITHOBID) 300 MG CR tablet, QUEtiapine (SEROQUEL) 50 MG tablet, traZODone (DESYREL) 100 MG tablet, Lithium level, COMPLETE METABOLIC PANEL WITH GFR, CBC  History of hypertension  Insomnia due to other mental disorder - Plan: traZODone (DESYREL) 100 MG tablet  Tobacco abuse disorder  Anxiety - Plan: ALPRAZolam (XANAX) 0.5 MG tablet  Muscle spasm - Plan: cyclobenzaprine (FLEXERIL) 10 MG tablet  Functional memory problem - Plan: donepezil (ARICEPT) 10 MG tablet  Chronic bronchitis, unspecified chronic bronchitis type ([place])  Patient Instructions  [facility]) [address] [place], [place] [address] [phone] Fax: [phone] Appointment: Oct 12, 2016 @ 8:15AM     Visit summary with medication list and pertinent instructions was printed for patient to review. All questions at time of visit were answered - patient instructed to contact office with any additional concerns. ER/RTC precautions were reviewed with the patient. Follow-up plan: Return in about 3 months (around 12/21/2016) for [facility], sooner if needed .  Note: Total time spent 45 minutes, greater than 50% of the visit was spent face-to-face counseling and coordinating care for the following: The primary encounter diagnosis was Bipolar 1 disorder, mixed, partial  remission ([place]). Diagnoses of History of hypertension, Insomnia due to other mental disorder, Tobacco abuse disorder, Anxiety, Muscle spasm, and Functional memory problem were also pertinent to this visit.Marland Kitchen

## 2016-09-22 LAB — CBC
HEMATOCRIT: 38.9 % (ref 35.0–45.0)
Hemoglobin: 12.8 g/dL (ref 11.7–15.5)
MCH: 28.4 pg (ref 27.0–33.0)
MCHC: 32.9 g/dL (ref 32.0–36.0)
MCV: 86.4 fL (ref 80.0–100.0)
MPV: 10.6 fL (ref 7.5–12.5)
PLATELETS: 221 10*3/uL (ref 140–400)
RBC: 4.5 10*6/uL (ref 3.80–5.10)
RDW: 13.8 % (ref 11.0–15.0)
WBC: 8.1 10*3/uL (ref 3.8–10.8)

## 2016-09-22 LAB — LITHIUM LEVEL: Lithium Lvl: 1 mmol/L (ref 0.6–1.2)

## 2016-09-22 LAB — COMPLETE METABOLIC PANEL WITH GFR
AG RATIO: 1.9 (calc) (ref 1.0–2.5)
ALBUMIN MSPROF: 4.3 g/dL (ref 3.6–5.1)
ALKALINE PHOSPHATASE (APISO): 57 U/L (ref 33–130)
ALT: 6 U/L (ref 6–29)
AST: 13 U/L (ref 10–35)
BILIRUBIN TOTAL: 0.4 mg/dL (ref 0.2–1.2)
BUN / CREAT RATIO: 7 (calc) (ref 6–22)
BUN: 7 mg/dL (ref 7–25)
CO2: 32 mmol/L (ref 20–32)
CREATININE: 1 mg/dL — AB (ref 0.60–0.93)
Calcium: 10 mg/dL (ref 8.6–10.4)
Chloride: 103 mmol/L (ref 98–110)
GFR, Est African American: 63 mL/min/{1.73_m2} (ref >=60)
GFR, Est Non African American: 55 mL/min/{1.73_m2} — ABNORMAL LOW (ref >=60)
GLOBULIN: 2.3 g/dL (ref 1.9–3.7)
Glucose, Bld: 94 mg/dL (ref 65–99)
POTASSIUM: 4.1 mmol/L (ref 3.5–5.3)
SODIUM: 138 mmol/L (ref 135–146)
Total Protein: 6.6 g/dL (ref 6.1–8.1)

## 2016-09-25 DIAGNOSIS — M25571 Pain in right ankle and joints of right foot: Secondary | ICD-10-CM | POA: Diagnosis not present

## 2016-09-25 DIAGNOSIS — M76821 Posterior tibial tendinitis, right leg: Secondary | ICD-10-CM | POA: Diagnosis not present

## 2016-10-17 ENCOUNTER — Other Ambulatory Visit: Payer: Self-pay | Admitting: Osteopathic Medicine

## 2016-10-17 DIAGNOSIS — F3177 Bipolar disorder, in partial remission, most recent episode mixed: Secondary | ICD-10-CM

## 2016-10-17 DIAGNOSIS — F99 Mental disorder, not otherwise specified: Secondary | ICD-10-CM

## 2016-10-17 DIAGNOSIS — F5105 Insomnia due to other mental disorder: Secondary | ICD-10-CM

## 2016-10-17 DIAGNOSIS — F419 Anxiety disorder, unspecified: Secondary | ICD-10-CM

## 2016-10-18 ENCOUNTER — Other Ambulatory Visit: Payer: Self-pay | Admitting: Osteopathic Medicine

## 2016-10-18 DIAGNOSIS — F419 Anxiety disorder, unspecified: Secondary | ICD-10-CM

## 2016-10-18 MED ORDER — ALPRAZOLAM 0.5 MG PO TABS: 0.5000 mg | ORAL_TABLET | Freq: Two times a day (BID) | ORAL | 0 refills | Status: DC | PRN

## 2016-10-18 NOTE — Progress Notes (Signed)
No more refills alprazolam from Dr Loni Muse

## 2016-11-04 ENCOUNTER — Other Ambulatory Visit: Payer: Self-pay | Admitting: Osteopathic Medicine

## 2016-11-04 DIAGNOSIS — M62838 Other muscle spasm: Secondary | ICD-10-CM

## 2016-11-14 ENCOUNTER — Ambulatory Visit (INDEPENDENT_AMBULATORY_CARE_PROVIDER_SITE_OTHER): Payer: Medicare Other | Admitting: Osteopathic Medicine

## 2016-11-14 ENCOUNTER — Other Ambulatory Visit: Payer: Self-pay | Admitting: Osteopathic Medicine

## 2016-11-14 ENCOUNTER — Ambulatory Visit (INDEPENDENT_AMBULATORY_CARE_PROVIDER_SITE_OTHER): Payer: Medicare Other

## 2016-11-14 ENCOUNTER — Encounter: Payer: Self-pay | Admitting: Osteopathic Medicine

## 2016-11-14 VITALS — BP 121/67 | HR 82 | Temp 98.4°F | Wt 140.0 lb

## 2016-11-14 DIAGNOSIS — R05 Cough: Secondary | ICD-10-CM | POA: Diagnosis not present

## 2016-11-14 DIAGNOSIS — F17200 Nicotine dependence, unspecified, uncomplicated: Secondary | ICD-10-CM

## 2016-11-14 DIAGNOSIS — F3177 Bipolar disorder, in partial remission, most recent episode mixed: Secondary | ICD-10-CM

## 2016-11-14 DIAGNOSIS — R0602 Shortness of breath: Secondary | ICD-10-CM | POA: Diagnosis not present

## 2016-11-14 DIAGNOSIS — J449 Chronic obstructive pulmonary disease, unspecified: Secondary | ICD-10-CM

## 2016-11-14 DIAGNOSIS — J441 Chronic obstructive pulmonary disease with (acute) exacerbation: Secondary | ICD-10-CM

## 2016-11-14 DIAGNOSIS — I700 Atherosclerosis of aorta: Secondary | ICD-10-CM

## 2016-11-14 MED ORDER — BUDESONIDE-FORMOTEROL FUMARATE 80-4.5 MCG/ACT IN AERO: 2.0000 | INHALATION_SPRAY | Freq: Two times a day (BID) | RESPIRATORY_TRACT | 3 refills | Status: DC

## 2016-11-14 MED ORDER — PREDNISONE 20 MG PO TABS: 20.0000 mg | ORAL_TABLET | Freq: Two times a day (BID) | ORAL | 0 refills | Status: DC

## 2016-11-14 MED ORDER — QUETIAPINE FUMARATE 50 MG PO TABS: 50.0000 mg | ORAL_TABLET | Freq: Every day | ORAL | 0 refills | Status: DC

## 2016-11-14 MED ORDER — BENZONATATE 200 MG PO CAPS: 200.0000 mg | ORAL_CAPSULE | Freq: Three times a day (TID) | ORAL | 0 refills | Status: DC | PRN
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
## 2016-11-15 ENCOUNTER — Encounter: Payer: Self-pay | Admitting: Osteopathic Medicine

## 2016-11-17 DIAGNOSIS — F5101 Primary insomnia: Secondary | ICD-10-CM | POA: Diagnosis not present

## 2016-11-17 DIAGNOSIS — F3173 Bipolar disorder, in partial remission, most recent episode manic: Secondary | ICD-10-CM | POA: Diagnosis not present

## 2016-11-17 DIAGNOSIS — F17200 Nicotine dependence, unspecified, uncomplicated: Secondary | ICD-10-CM | POA: Diagnosis not present

## 2016-11-20 ENCOUNTER — Other Ambulatory Visit: Payer: Self-pay | Admitting: Osteopathic Medicine

## 2016-11-20 DIAGNOSIS — J441 Chronic obstructive pulmonary disease with (acute) exacerbation: Secondary | ICD-10-CM

## 2016-11-24 ENCOUNTER — Emergency Department (INDEPENDENT_AMBULATORY_CARE_PROVIDER_SITE_OTHER)
Admission: EM | Admit: 2016-11-24 | Discharge: 2016-11-24 | Disposition: A | Discharge status: Home / Self Care | Payer: Medicare Other | Source: Home / Self Care | Attending: Family Medicine | Admitting: Family Medicine

## 2016-11-24 ENCOUNTER — Encounter: Payer: Self-pay | Admitting: Emergency Medicine

## 2016-11-24 ENCOUNTER — Other Ambulatory Visit: Payer: Self-pay

## 2016-11-24 ENCOUNTER — Emergency Department (INDEPENDENT_AMBULATORY_CARE_PROVIDER_SITE_OTHER): Payer: Medicare Other

## 2016-11-24 DIAGNOSIS — J069 Acute upper respiratory infection, unspecified: Secondary | ICD-10-CM

## 2016-11-24 DIAGNOSIS — I700 Atherosclerosis of aorta: Secondary | ICD-10-CM

## 2016-11-24 DIAGNOSIS — R05 Cough: Secondary | ICD-10-CM

## 2016-11-24 DIAGNOSIS — R053 Chronic cough: Secondary | ICD-10-CM

## 2016-11-24 LAB — POCT RAPID STREP A (OFFICE): Rapid Strep A Screen: NEGATIVE

## 2016-11-24 MED ORDER — IPRATROPIUM BROMIDE 0.06 % NA SOLN: 2.0000 | Freq: Four times a day (QID) | NASAL | 1 refills | Status: DC

## 2016-11-24 MED ORDER — METHYLPREDNISOLONE SODIUM SUCC 40 MG IJ SOLR: 40.0000 mg | Freq: Once | INTRAMUSCULAR | Status: DC

## 2016-11-24 MED ORDER — BENZONATATE 100 MG PO CAPS: 100.0000 mg | ORAL_CAPSULE | Freq: Three times a day (TID) | ORAL | 0 refills | Status: DC

## 2016-11-24 NOTE — ED Provider Notes (Signed)
[name]    CSN: [id number] Arrival date & time: 11/24/16  1253     History   Chief Complaint Chief Complaint  Patient presents with  . Cough    HPI DORINDA STEHR is a 76 y.o. female.   HPI  SAMEKA BAGENT is a 76 y.o. female presenting to UC with c/o cough and congestion with dry throat at night for about 6 weeks. She was seen by her PCP for similar symptoms on 11/14/16, no evidence of pneumonia on exam. Pt was dx with a COPD exacerbation, prescribed prednisone, Symbicort and tessalon.  She was advised to f/u in 2 weeks for lung function test if inhaler isn't helping. Pt states she ran out of the tessalon that was helping but inhaler has not been helping. She was unaware she needed to f/u 2 weeks from 11/14/16. She is unsure if her daughter made an appointment on her behalf.  Denies fever, chills, n/v/d.    Past Medical History:  Diagnosis Date  . Bipolar 1 disorder [facility])     Patient Active Problem List   Diagnosis Date Noted  . Bipolar 1 disorder, mixed, partial remission ([place]) 09/21/2016  . 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 History of hypertension 09/21/2016  . Tobacco abuse disorder 09/21/2016  . Chronic bronchitis ([place]) 09/21/2016  . Functional memory problem 09/21/2016  . Muscle spasm 09/21/2016  . Anxiety 09/21/2016  . Adjustment disorder with disturbance of emotion 08/29/2016    Past Surgical History:  Procedure Laterality Date  . ABDOMINAL HYSTERECTOMY    . ANEURYSM COILING    . BACK SURGERY    . POLYPECTOMY     VOCAL CORD    OB History    No data available       Home Medications    Prior to Admission medications   Medication Sig Start Date End Date Taking? Authorizing Provider  albuterol (PROVENTIL HFA;VENTOLIN HFA) 108 (90 Base) MCG/ACT inhaler Inhale 2 puffs into the lungs every 4 (four) hours as needed for wheezing or shortness of breath.    [provider]  ALPRAZolam Duanne Moron) 0.5 MG tablet Take 1  tablet (0.5 mg total) by mouth 2 (two) times daily as needed (NOTE: PATIENT WAS TO F/U W/ PSYCHIATRY 10/12/16 - NO MORE REFILLS FROM DR Sheppard Coil). for anxiety 10/18/16   Emeterio Reeve, DO  benzonatate (TESSALON) 100 MG capsule Take 1 capsule (100 mg total) by mouth every 8 (eight) hours. 11/24/16   Noe Gens, PA-C  budesonide-formoterol (SYMBICORT) 80-4.5 MCG/ACT inhaler Inhale 2 puffs 2 (two) times daily into the lungs. 11/14/16   Emeterio Reeve, DO  cyclobenzaprine (FLEXERIL) 10 MG tablet TAKE 1 TABLET(10 MG) BY MOUTH THREE TIMES DAILY AS NEEDED FOR MUSCLE SPASMS 11/07/16   Emeterio Reeve, DO  donepezil (ARICEPT) 10 MG tablet Take 10 mg by mouth at bedtime.    [provider]  folic acid (FOLVITE) 1 MG tablet Take 1 mg by mouth daily.    [provider]  ipratropium (ATROVENT) 0.06 % nasal spray Place 2 sprays into both nostrils 4 (four) times daily. 11/24/16   Noe Gens, PA-C  levothyroxine (SYNTHROID, LEVOTHROID) 50 MCG tablet Take 50 mcg by mouth daily before breakfast.    [provider]  lithium carbonate (LITHOBID) 300 MG CR tablet Take by mouth. 09/13/16   [provider]  pantoprazole (PROTONIX) 40 MG tablet Take 40 mg by mouth 2 (two) times daily.    [provider]  potassium chloride SA (K-DUR,KLOR-CON) 20 MEQ tablet Take 20 mEq by mouth 2 (two) times daily.    [provider]  predniSONE (DELTASONE) 20 MG tablet Take 1 tablet (20 mg total) 2 (two) times daily with a meal by mouth. 11/14/16   Emeterio Reeve, DO  QUEtiapine (SEROQUEL) 50 MG tablet Take 1 tablet (50 mg total) at bedtime by mouth. 11/14/16   Emeterio Reeve, DO  traZODone (DESYREL) 100 MG tablet TAKE 1 TABLET(100 MG) BY MOUTH AT BEDTIME 10/17/16   Emeterio Reeve, DO  triamterene-hydrochlorothiazide (DYAZIDE) 37.5-25 MG capsule Take 1 capsule by mouth 2 (two) times daily.    [provider]    Family History Family History    Problem Relation Age of Onset  . Hyperlipidemia Mother   . Hypertension Mother   . Diabetes Mother   . Hyperlipidemia Father   . Hypertension Father   . Cancer Sister     Social History Social History   Tobacco Use  . Smoking status: Current Every Day Smoker  . Smokeless tobacco: Never Used  Substance Use Topics  . Alcohol use: Not on file  . Drug use: Not on file     Allergies   Erythromycin; Penicillins; Macrobid [nitrofurantoin macrocrystal]; and Welchol [colesevelam hcl]   Review of Systems Review of Systems  Constitutional: Negative for chills and fever.  HENT: Positive for congestion and sore throat. Negative for ear pain, trouble swallowing and voice change.   Respiratory: Positive for cough. Negative for shortness of breath.   Cardiovascular: Negative for chest pain and palpitations.  Gastrointestinal: Negative for abdominal pain, diarrhea, nausea and vomiting.  Musculoskeletal: Negative for arthralgias, back pain and myalgias.  Skin: Negative for rash.     Physical Exam Triage Vital Signs ED Triage Vitals  Enc Vitals Group     BP 11/24/16 1313 116/69     Pulse Rate 11/24/16 1313 96     Resp 11/24/16 1313 16     Temp 11/24/16 1313 98.3 F (36.8 C)     Temp Source 11/24/16 1313 Oral     SpO2 11/24/16 1313 94 %     Weight 11/24/16 1314 135 lb (61.2 kg)     Height 11/24/16 1314 5\' 4"  (1.626 m)     Head Circumference --      Peak Flow --      Pain Score --      Pain Loc --      Pain Edu? --      Excl. in [place]? --    No data found.  Updated Vital Signs BP 116/69 (BP Location: Right Arm)   Pulse 96   Temp 98.3 F (36.8 C) (Oral)   Resp 16   Ht 5\' 4"  (1.626 m)   Wt 135 lb (61.2 kg)   SpO2 94%   BMI 23.17 kg/m   Visual Acuity Right Eye Distance:   Left Eye Distance:   Bilateral Distance:    Right Eye Near:   Left Eye Near:    Bilateral Near:     Physical Exam  Constitutional: She is oriented to person, place, and time. She appears  well-developed and well-nourished. No distress.  HENT:  Head: Normocephalic and atraumatic.  Right Ear: Tympanic membrane normal.  Left Ear: Tympanic membrane normal.  Nose: Nose normal.  Mouth/Throat: Uvula is midline, oropharynx is clear and moist and mucous membranes are normal.  Eyes: EOM are normal.  Neck: Normal range of motion. Neck supple.  Cardiovascular: Normal rate and regular  rhythm.  Pulmonary/Chest: Effort normal. No stridor. No respiratory distress. She has wheezes ( faint expiratory wheeze). She has no rales.  Musculoskeletal: Normal range of motion.  Lymphadenopathy:    She has no cervical adenopathy.  Neurological: She is alert and oriented to person, place, and time.  Skin: Skin is warm and dry. She is not diaphoretic.  Psychiatric: She has a normal mood and affect. Her behavior is normal.  Nursing note and vitals reviewed.    UC Treatments / Results  Labs (all labs ordered are listed, but only abnormal results are displayed) Labs Reviewed  POCT RAPID STREP A (OFFICE)    EKG  EKG Interpretation None       Radiology Dg Chest 2 View  Result Date: 11/24/2016 CLINICAL DATA:  Six weeks of cough and congestion, now right-sided upper back pain. EXAM: CHEST  2 VIEW COMPARISON:  Chest x-ray dated 11/14/2016. FINDINGS: Heart size and mediastinal contours are within normal limits. Atherosclerotic changes noted at the aortic arch. Lungs are clear. No pleural effusion or pneumothorax seen. Mild degenerative spurring within the thoracic spine. Mild scoliosis. No acute or suspicious osseous finding. IMPRESSION: No active cardiopulmonary disease. No evidence of pneumonia or pulmonary edema. Aortic atherosclerosis. Electronically Signed   By: Franki Cabot M.D.   On: 11/24/2016 13:51    Procedures Procedures (including critical care time)  Medications Ordered in UC Medications  methylPREDNISolone sodium succinate (SOLU-MEDROL) 40 mg/mL injection 40 mg (not  administered)     Initial Impression / Assessment and Plan / UC Course  I have reviewed the triage vital signs and the nursing notes.  Pertinent labs & imaging results that were available during my care of the patient were reviewed by me and considered in my medical decision making (see chart for details).     Hx and exam c/w COPD exacerbation.   CXR: no change since CXR on 11/14/16, no evidence of acute cardiopulmonary disease Pt then requested strep test.  Throat exam not concerning for strep, however, rapid test performed in clinic. Rapid strep: Negative. No culture sent as hx and exam no concerning for strep.  Encouraged symptomatic treatment and f/u with PCP and possibly with pulmonologist. Discussed symptoms that warrant emergent care in the ED.   Final Clinical Impressions(s) / UC Diagnoses   Final diagnoses:  Upper respiratory tract infection, unspecified type  Persistent cough for 3 weeks or longer    ED Discharge Orders        Ordered    benzonatate (TESSALON) 100 MG capsule  Every 8 hours     11/24/16 1440    ipratropium (ATROVENT) 0.06 % nasal spray  4 times daily     11/24/16 1440       Controlled Substance Prescriptions [place] Controlled Substance Registry consulted? Not Applicable   Tyrell Antonio 11/24/16 1443

## 2016-11-24 NOTE — Discharge Instructions (Signed)
°  You may take 500mg acetaminophen every 4-6 hours or in combination with ibuprofen 400-600mg every 6-8 hours as needed for pain, inflammation, and fever. ° °Be sure to drink at least eight 8oz glasses of water to stay well hydrated and get at least 8 hours of sleep at night, preferably more while sick.  ° °

## 2016-11-24 NOTE — ED Triage Notes (Signed)
Patient states she has had a cough and congestion with dry throat at night for about 6 weeks.

## 2016-11-26 ENCOUNTER — Telehealth: Payer: Self-pay | Admitting: Emergency Medicine

## 2016-11-26 NOTE — Telephone Encounter (Signed)
Courtesy call to patient. LM and advised to CB with questions or concerns.

## 2016-11-30 ENCOUNTER — Encounter: Payer: Self-pay | Admitting: Osteopathic Medicine

## 2016-11-30 ENCOUNTER — Ambulatory Visit (INDEPENDENT_AMBULATORY_CARE_PROVIDER_SITE_OTHER): Payer: Medicare Other | Admitting: Osteopathic Medicine

## 2016-11-30 VITALS — BP 116/70 | HR 86 | Temp 97.3°F | Resp 16 | Wt 137.4 lb

## 2016-11-30 DIAGNOSIS — J449 Chronic obstructive pulmonary disease, unspecified: Secondary | ICD-10-CM

## 2016-11-30 DIAGNOSIS — F3177 Bipolar disorder, in partial remission, most recent episode mixed: Secondary | ICD-10-CM

## 2016-11-30 MED ORDER — BENZONATATE 100 MG PO CAPS: 200.0000 mg | ORAL_CAPSULE | Freq: Three times a day (TID) | ORAL | 1 refills | Status: DC | PRN

## 2016-11-30 MED ORDER — BENZONATATE 200 MG PO CAPS: 200.0000 mg | ORAL_CAPSULE | Freq: Three times a day (TID) | ORAL | 3 refills | Status: DC | PRN

## 2016-11-30 NOTE — Progress Notes (Signed)
HPI: Anita Krause is a 76 y.o. female who  has a past medical history of Bipolar 1 disorder ([place]).  she presents to [facility] today, 11/30/16,  for chief complaint of:  Chief Complaint  Patient presents with  . Advice Only    Lab results    Patient is doing well today.  No complaints.  She is really not sure why she is here for this appointment.  On her last AVS, noted to follow-up in 2 weeks for pulmonary function tests if she is not feeling better, looks like a follow-up appointment was just made for her.    Her breathing is a little bit better inhaled medications.  She is following with psychiatry, medications adjusted as below.  Her lithium was discontinued, Seroquel was increased.     Past medical, surgical, social and family history reviewed:  Patient Active Problem List   Diagnosis Date Noted  . Bipolar 1 disorder, mixed, partial remission ([place]) 09/21/2016  . 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 History of hypertension 09/21/2016  . Tobacco abuse disorder 09/21/2016  . Chronic bronchitis ([place]) 09/21/2016  . Functional memory problem 09/21/2016  . Muscle spasm 09/21/2016  . Anxiety 09/21/2016  . Adjustment disorder with disturbance of emotion 08/29/2016    Past Surgical History:  Procedure Laterality Date  . ABDOMINAL HYSTERECTOMY    . ANEURYSM COILING    . BACK SURGERY    . POLYPECTOMY     VOCAL CORD    Social History   Tobacco Use  . Smoking status: Current Every Day Smoker  . Smokeless tobacco: Never Used  Substance Use Topics  . Alcohol use: Not on file    Family History  Problem Relation Age of Onset  . Hyperlipidemia Mother   . Hypertension Mother   . Diabetes Mother   . Hyperlipidemia Father   . Hypertension Father   . Cancer Sister      Current medication list and allergy/intolerance information reviewed:    Current Outpatient Medications  Medication Sig Dispense Refill  . albuterol  (PROVENTIL HFA;VENTOLIN HFA) 108 (90 Base) MCG/ACT inhaler Inhale 2 puffs into the lungs every 4 (four) hours as needed for wheezing or shortness of breath.    . ALPRAZolam (XANAX) 0.5 MG tablet Take 1 tablet (0.5 mg total) by mouth 2 (two) times daily as needed (NOTE: PATIENT WAS TO F/U W/ PSYCHIATRY 10/12/16 - NO MORE REFILLS FROM DR Sheppard Coil). for anxiety 42 tablet 0  . benzonatate (TESSALON) 200 MG capsule Take 1 capsule (200 mg total) by mouth 3 (three) times daily as needed for cough. 30 capsule 3  . budesonide-formoterol (SYMBICORT) 80-4.5 MCG/ACT inhaler Inhale 2 puffs 2 (two) times daily into the lungs. 1 Inhaler 3  . cyclobenzaprine (FLEXERIL) 10 MG tablet TAKE 1 TABLET(10 MG) BY MOUTH THREE TIMES DAILY AS NEEDED FOR MUSCLE SPASMS 30 tablet 2  . donepezil (ARICEPT) 10 MG tablet Take 10 mg by mouth at bedtime.    . folic acid (FOLVITE) 1 MG tablet Take 1 mg by mouth daily.    Marland Kitchen ipratropium (ATROVENT) 0.06 % nasal spray Place 2 sprays into both nostrils 4 (four) times daily. 15 mL 1  . levothyroxine (SYNTHROID, LEVOTHROID) 50 MCG tablet Take 50 mcg by mouth daily before breakfast.    . pantoprazole (PROTONIX) 40 MG tablet Take 40 mg by mouth 2 (two) times daily.    . potassium chloride SA (K-DUR,KLOR-CON) 20 MEQ tablet Take 20 mEq  by mouth 2 (two) times daily.    . QUEtiapine (SEROQUEL) 100 MG tablet Take 100 mg by mouth at bedtime.    . traZODone (DESYREL) 100 MG tablet TAKE 1 TABLET(100 MG) BY MOUTH AT BEDTIME 90 tablet 1  . triamterene-hydrochlorothiazide (DYAZIDE) 37.5-25 MG capsule Take 1 capsule by mouth 2 (two) times daily.     No current facility-administered medications for this visit.     Allergies  Allergen Reactions  . Erythromycin Anaphylaxis    Throat closes up, ling in mouth peals  . Penicillins Anaphylaxis  . Macrobid [Nitrofurantoin Macrocrystal]   . Welchol [Colesevelam Hcl]       Review of Systems:  Constitutional:  No  fever, no chills, +recent illness but  feeling better ,  HEENT: No  headache, no vision change,  Cardiac: No  chest pain, No  pressure  Respiratory:  No  shortness of breath. Chronic Cough  Musculoskeletal: No new myalgia/arthralgia  Neurologic: No  weakness, No  dizziness  Psychiatric: No  concerns with depression, No  concerns with anxiety, +sleep problems, No mood problems  Exam:  BP 116/70 (BP Location: Left Arm, Patient Position: Sitting, Cuff Size: Large)   Pulse 86   Temp (!) 97.3 F (36.3 C) (Oral)   Resp 16   Wt 137 lb 6.4 oz (62.3 kg)   SpO2 100%   BMI 23.58 kg/m    Constitutional: VS see above. General Appearance: alert, well-developed, well-nourished, NAD  Eyes: Normal lids and conjunctive, non-icteric sclera  Ears, Nose, Mouth, Throat: MMM, Normal external inspection ears/nares/mouth/lips/gums.   Neck: No masses, trachea midline. No thyroid enlargement.   Respiratory: Normal respiratory effort. no wheeze, no rhonchi, no rales, diminished breath sounds bilaterally   Cardiovascular: S1/S2 normal, no murmur, no rub/gallop auscultated. RRR. No lower extremity edema  Musculoskeletal: Gait normal.   Neurological: Normal balance/coordination. No tremor.   Skin: warm, dry, intact.  Psychiatric: Normal judgment/insight. Normal mood and affect. Oriented x3.     ASSESSMENT/PLAN:   Chronic obstructive pulmonary disease, unspecified COPD type ([place]) - Doing well on inhalers, okay to refill as needed.  Can RTC for PFT if desired  Bipolar 1 disorder, mixed, partial remission ([place]) - Following with psychiatry, doing well      Visit summary with medication list and pertinent instructions was printed for patient to review. All questions at time of visit were answered - patient instructed to contact office with any additional concerns. ER/RTC precautions were reviewed with the patient.   Follow-up plan: Return in about 3 months (around 03/01/2017) for recheck breathing, sooner if needed.  Note: Total  time spent 15 minutes, greater than 50% of the visit was spent face-to-face counseling and coordinating care for the following: The primary encounter diagnosis was Chronic obstructive pulmonary disease, unspecified COPD type ([place]). A diagnosis of Bipolar 1 disorder, mixed, partial remission ([facility]) was also pertinent to this visit.Marland Kitchen  Please note: voice recognition software was used to produce this document, and typos may escape review. Please contact Dr. Sheppard Coil for any needed clarifications.

## 2016-12-01 ENCOUNTER — Other Ambulatory Visit: Payer: Self-pay | Admitting: Osteopathic Medicine

## 2016-12-01 DIAGNOSIS — M62838 Other muscle spasm: Secondary | ICD-10-CM

## 2016-12-02 ENCOUNTER — Encounter: Payer: Self-pay | Admitting: Osteopathic Medicine

## 2016-12-11 ENCOUNTER — Other Ambulatory Visit: Payer: Self-pay | Admitting: Osteopathic Medicine

## 2016-12-11 DIAGNOSIS — F3177 Bipolar disorder, in partial remission, most recent episode mixed: Secondary | ICD-10-CM

## 2016-12-12 ENCOUNTER — Telehealth: Payer: Self-pay

## 2016-12-12 ENCOUNTER — Other Ambulatory Visit: Payer: Self-pay | Admitting: Osteopathic Medicine

## 2016-12-12 DIAGNOSIS — M62838 Other muscle spasm: Secondary | ICD-10-CM

## 2016-12-12 MED ORDER — CYCLOBENZAPRINE HCL 10 MG PO TABS: 10.0000 mg | ORAL_TABLET | Freq: Three times a day (TID) | ORAL | 3 refills | Status: DC | PRN

## 2016-12-12 NOTE — Telephone Encounter (Signed)
Psychiatry is managing this prescription - pharmacy or patient needs to contact that physician

## 2016-12-12 NOTE — Telephone Encounter (Signed)
Spoke with Pt, scheduled an appointment for tomorrow with PCP for eval.

## 2016-12-12 NOTE — Telephone Encounter (Signed)
Needs appointment - Even though she has COPD, I do not prescribe steroids or antibiotics without an office visit to listen to the lungs and consider a chest Xray if needed to evaluate for pneumonia.

## 2016-12-12 NOTE — Progress Notes (Signed)
Refill erx sent

## 2016-12-12 NOTE — Telephone Encounter (Signed)
Left message on patient vm with recommendations as noted below. Rhonda Cunningham,CMA

## 2016-12-12 NOTE — Telephone Encounter (Signed)
Patient called requesting refill for prednisone. States she is still coughing and having congestion. Advised patient to come in for office visit, patient declined. Routing for review

## 2016-12-13 ENCOUNTER — Ambulatory Visit: Payer: Self-pay | Admitting: Osteopathic Medicine

## 2016-12-15 DIAGNOSIS — F3173 Bipolar disorder, in partial remission, most recent episode manic: Secondary | ICD-10-CM | POA: Diagnosis not present

## 2016-12-15 DIAGNOSIS — F5101 Primary insomnia: Secondary | ICD-10-CM | POA: Diagnosis not present

## 2016-12-15 DIAGNOSIS — F17200 Nicotine dependence, unspecified, uncomplicated: Secondary | ICD-10-CM | POA: Diagnosis not present

## 2017-01-03 ENCOUNTER — Telehealth: Payer: Self-pay

## 2017-01-03 NOTE — Telephone Encounter (Signed)
PT came in today believing she had an appointment. She wants to update her file that she only wants prescriptions sent to [facility] and she is currently in need of a few medications. Please call pt to go over medications and find out what she needs refills on.

## 2017-01-03 NOTE — Telephone Encounter (Signed)
Hydroxyzine and Lamotrigine are prescribed by her psychiatrist Petra Kuba throid isn't on her list, neither is naproxen  Agree needs appt, bring all pill bottles

## 2017-01-03 NOTE — Telephone Encounter (Signed)
FYI

## 2017-01-03 NOTE — Telephone Encounter (Signed)
There was a written list dropped off requesting refills on hydroxyzine, nature thyroid, naproxen, and lamotrigine. PCP has never written any of these Rx's. An appointment will be required to discuss. Attempted to contact Pt to advise, no answer. Left recommendation on Pt's VM. Callback provided for scheduling.

## 2017-01-04 NOTE — Telephone Encounter (Signed)
Left recommendation on Pt's VM.

## 2017-01-08 ENCOUNTER — Ambulatory Visit: Payer: Self-pay | Admitting: Osteopathic Medicine

## 2017-01-12 ENCOUNTER — Ambulatory Visit: Payer: Self-pay | Admitting: Osteopathic Medicine

## 2017-01-15 DIAGNOSIS — Z0189 Encounter for other specified special examinations: Secondary | ICD-10-CM

## 2017-01-16 ENCOUNTER — Emergency Department (INDEPENDENT_AMBULATORY_CARE_PROVIDER_SITE_OTHER)
Admission: EM | Admit: 2017-01-16 | Discharge: 2017-01-16 | Disposition: A | Discharge status: Home / Self Care | Payer: Medicare Other | Source: Home / Self Care

## 2017-01-16 ENCOUNTER — Encounter: Payer: Self-pay | Admitting: Emergency Medicine

## 2017-01-16 ENCOUNTER — Ambulatory Visit: Payer: Self-pay | Admitting: Osteopathic Medicine

## 2017-01-16 ENCOUNTER — Other Ambulatory Visit: Payer: Self-pay

## 2017-01-16 DIAGNOSIS — M545 Low back pain, unspecified: Secondary | ICD-10-CM

## 2017-01-16 DIAGNOSIS — M542 Cervicalgia: Secondary | ICD-10-CM

## 2017-01-16 DIAGNOSIS — G8929 Other chronic pain: Secondary | ICD-10-CM

## 2017-01-16 NOTE — ED Provider Notes (Signed)
[name]    CSN: [id number] Arrival date & time: 01/16/17  1445     History   Chief Complaint Chief Complaint  Patient presents with  . Shoulder Pain    HPI Anita Krause is a 77 y.o. female.   The history is provided by the patient. No language interpreter was used.  Neck Injury  This is a recurrent problem. The problem occurs constantly. The problem has been gradually worsening. Nothing aggravates the symptoms. Nothing relieves the symptoms. She has tried nothing for the symptoms.   Pt has neck and back pain from disc disease.  Pt missed her appointment today at primary care with Dr. Sheppard Coil.  Pt is requesting a steroid injection.  Pt reports steroids help her when she has a flare up of symptoms.   Past Medical History:  Diagnosis Date  . Bipolar 1 disorder [facility])     Patient Active Problem List   Diagnosis Date Noted  . Bipolar 1 disorder, mixed, partial remission ([place]) 09/21/2016  . 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 History of hypertension 09/21/2016  . Tobacco abuse disorder 09/21/2016  . Chronic bronchitis ([place]) 09/21/2016  . Functional memory problem 09/21/2016  . Muscle spasm 09/21/2016  . Anxiety 09/21/2016  . Adjustment disorder with disturbance of emotion 08/29/2016    Past Surgical History:  Procedure Laterality Date  . ABDOMINAL HYSTERECTOMY    . ANEURYSM COILING    . BACK SURGERY    . POLYPECTOMY     VOCAL CORD    OB History    No data available       Home Medications    Prior to Admission medications   Medication Sig Start Date End Date Taking? Authorizing Provider  albuterol (PROVENTIL HFA;VENTOLIN HFA) 108 (90 Base) MCG/ACT inhaler Inhale 2 puffs into the lungs every 4 (four) hours as needed for wheezing or shortness of breath.    [provider]  ALPRAZolam Duanne Moron) 0.5 MG tablet Take 1 tablet (0.5 mg total) by mouth 2 (two) times daily as needed (NOTE: PATIENT WAS TO F/U W/ PSYCHIATRY 10/12/16  - NO MORE REFILLS FROM DR Sheppard Coil). for anxiety 10/18/16   Emeterio Reeve, DO  budesonide-formoterol [facility]) 80-4.5 MCG/ACT inhaler Inhale 2 puffs 2 (two) times daily into the lungs. 11/14/16   Emeterio Reeve, DO  cyclobenzaprine (FLEXERIL) 10 MG tablet Take 1 tablet (10 mg total) by mouth 3 (three) times daily as needed for muscle spasms. 12/12/16   Emeterio Reeve, DO  donepezil (ARICEPT) 10 MG tablet Take 10 mg by mouth at bedtime.    [provider]  folic acid (FOLVITE) 1 MG tablet Take 1 mg by mouth daily.    [provider]  ipratropium (ATROVENT) 0.06 % nasal spray Place 2 sprays into both nostrils 4 (four) times daily. 11/24/16   Noe Gens, PA-C  levothyroxine (SYNTHROID, LEVOTHROID) 50 MCG tablet Take 50 mcg by mouth daily before breakfast.    [provider]  pantoprazole (PROTONIX) 40 MG tablet Take 40 mg by mouth 2 (two) times daily.    [provider]  potassium chloride SA (K-DUR,KLOR-CON) 20 MEQ tablet Take 20 mEq by mouth 2 (two) times daily.    [provider]  QUEtiapine (SEROQUEL) 100 MG tablet Take 100 mg by mouth at bedtime. 11/17/16 12/17/16  [provider]  traZODone (DESYREL) 100 MG tablet TAKE 1 TABLET(100 MG) BY MOUTH AT BEDTIME 10/17/16   Emeterio Reeve, DO  triamterene-hydrochlorothiazide (DYAZIDE) 37.5-25 MG  capsule Take 1 capsule by mouth 2 (two) times daily.    [provider]    Family History Family History  Problem Relation Age of Onset  . Hyperlipidemia Mother   . Hypertension Mother   . Diabetes Mother   . Hyperlipidemia Father   . Hypertension Father   . Cancer Sister     Social History Social History   Tobacco Use  . Smoking status: Current Every Day Smoker    Types: Cigarettes  . Smokeless tobacco: Never Used  Substance Use Topics  . Alcohol use: No    Frequency: Never  . Drug use: No     Allergies   Erythromycin; Penicillins; Macrobid  [nitrofurantoin macrocrystal]; and Welchol [colesevelam hcl]   Review of Systems Review of Systems  All other systems reviewed and are negative.    Physical Exam Triage Vital Signs ED Triage Vitals [01/16/17 1510]  Enc Vitals Group     BP 129/79     Pulse Rate (!) 106     Resp      Temp 98.3 F (36.8 C)     Temp Source Oral     SpO2 95 %     Weight 142 lb (64.4 kg)     Height 5\' 4"  (1.626 m)     Head Circumference      Peak Flow      Pain Score 10     Pain Loc      Pain Edu?      Excl. in [place]?    No data found.  Updated Vital Signs BP 129/79 (BP Location: Right Arm)   Pulse (!) 106   Temp 98.3 F (36.8 C) (Oral)   Ht 5\' 4"  (1.626 m)   Wt 142 lb (64.4 kg)   SpO2 95%   BMI 24.37 kg/m   Visual Acuity Right Eye Distance:   Left Eye Distance:   Bilateral Distance:    Right Eye Near:   Left Eye Near:    Bilateral Near:     Physical Exam  Constitutional: She appears well-developed and well-nourished. No distress.  HENT:  Head: Normocephalic and atraumatic.  Neck: Neck supple.  Cardiovascular: Normal rate and regular rhythm.  No murmur heard. Pulmonary/Chest: Effort normal and breath sounds normal. No respiratory distress.  Abdominal: Soft. There is no tenderness.  Musculoskeletal: She exhibits no edema.  Diffusely tender upper neck and lower back.   Neurological: She is alert.  Skin: Skin is warm and dry.  Psychiatric: She has a normal mood and affect.  Nursing note and vitals reviewed.    UC Treatments / Results  Labs (all labs ordered are listed, but only abnormal results are displayed) Labs Reviewed - No data to display  EKG  EKG Interpretation None       Radiology No results found.  Procedures Procedures (including critical care time)  Medications Ordered in UC Medications - No data to display   Initial Impression / Assessment and Plan / UC Course  I have reviewed the triage vital signs and the nursing notes.  Pertinent labs &  imaging results that were available during my care of the patient were reviewed by me and considered in my medical decision making (see chart for details).    Pt advised to follow up with her primary MD.    Final Clinical Impressions(s) / UC Diagnoses   Final diagnoses:  Neck pain  Chronic low back pain without sciatica, unspecified back pain laterality    ED Discharge  Orders    None    An After Visit Summary was printed and given to the patient.   Controlled Substance Prescriptions [place] Controlled Substance Registry consulted? Not Applicable   Fransico Meadow, [place] 01/16/17 1558

## 2017-01-16 NOTE — ED Triage Notes (Signed)
Right shoulder pain x 3 weeks, lower rt back pain radiates into rt buttock, both chronic.

## 2017-01-17 ENCOUNTER — Ambulatory Visit: Payer: Self-pay | Admitting: Osteopathic Medicine

## 2017-01-26 DIAGNOSIS — F5101 Primary insomnia: Secondary | ICD-10-CM | POA: Diagnosis not present

## 2017-01-26 DIAGNOSIS — F17200 Nicotine dependence, unspecified, uncomplicated: Secondary | ICD-10-CM | POA: Diagnosis not present

## 2017-01-26 DIAGNOSIS — Z9114 Patient's other noncompliance with medication regimen: Secondary | ICD-10-CM | POA: Diagnosis not present

## 2017-01-26 DIAGNOSIS — F3110 Bipolar disorder, current episode manic without psychotic features, unspecified: Secondary | ICD-10-CM | POA: Diagnosis not present

## 2017-01-31 DIAGNOSIS — G4719 Other hypersomnia: Secondary | ICD-10-CM | POA: Diagnosis not present

## 2017-01-31 DIAGNOSIS — R05 Cough: Secondary | ICD-10-CM | POA: Diagnosis not present

## 2017-01-31 DIAGNOSIS — J449 Chronic obstructive pulmonary disease, unspecified: Secondary | ICD-10-CM | POA: Diagnosis not present

## 2017-01-31 DIAGNOSIS — R0683 Snoring: Secondary | ICD-10-CM | POA: Diagnosis not present

## 2017-02-06 DIAGNOSIS — F17200 Nicotine dependence, unspecified, uncomplicated: Secondary | ICD-10-CM | POA: Diagnosis not present

## 2017-02-06 DIAGNOSIS — F17219 Nicotine dependence, cigarettes, with unspecified nicotine-induced disorders: Secondary | ICD-10-CM | POA: Diagnosis not present

## 2017-02-06 DIAGNOSIS — Z87891 Personal history of nicotine dependence: Secondary | ICD-10-CM | POA: Diagnosis not present

## 2017-02-22 DIAGNOSIS — Z9114 Patient's other noncompliance with medication regimen: Secondary | ICD-10-CM | POA: Diagnosis not present

## 2017-02-22 DIAGNOSIS — F5101 Primary insomnia: Secondary | ICD-10-CM | POA: Diagnosis not present

## 2017-02-22 DIAGNOSIS — F3110 Bipolar disorder, current episode manic without psychotic features, unspecified: Secondary | ICD-10-CM | POA: Diagnosis not present

## 2017-02-22 DIAGNOSIS — F319 Bipolar disorder, unspecified: Secondary | ICD-10-CM | POA: Diagnosis not present

## 2017-02-22 DIAGNOSIS — F17200 Nicotine dependence, unspecified, uncomplicated: Secondary | ICD-10-CM | POA: Diagnosis not present

## 2017-02-26 ENCOUNTER — Encounter: Payer: Self-pay | Admitting: Sports Medicine

## 2017-03-07 ENCOUNTER — Encounter: Payer: Self-pay | Admitting: Family Medicine

## 2017-03-07 ENCOUNTER — Ambulatory Visit (INDEPENDENT_AMBULATORY_CARE_PROVIDER_SITE_OTHER): Payer: Medicare Other | Admitting: Osteopathic Medicine

## 2017-03-07 ENCOUNTER — Telehealth: Payer: Self-pay | Admitting: Osteopathic Medicine

## 2017-03-07 ENCOUNTER — Ambulatory Visit (INDEPENDENT_AMBULATORY_CARE_PROVIDER_SITE_OTHER): Payer: Medicare Other

## 2017-03-07 ENCOUNTER — Ambulatory Visit (INDEPENDENT_AMBULATORY_CARE_PROVIDER_SITE_OTHER): Payer: Medicare Other | Admitting: Family Medicine

## 2017-03-07 ENCOUNTER — Encounter: Payer: Self-pay | Admitting: Osteopathic Medicine

## 2017-03-07 VITALS — BP 129/55 | HR 100 | Ht 62.5 in | Wt 138.0 lb

## 2017-03-07 VITALS — BP 129/55 | HR 101 | Temp 98.0°F | Ht 62.5 in | Wt 138.0 lb

## 2017-03-07 DIAGNOSIS — M19011 Primary osteoarthritis, right shoulder: Secondary | ICD-10-CM

## 2017-03-07 DIAGNOSIS — M545 Low back pain, unspecified: Secondary | ICD-10-CM

## 2017-03-07 DIAGNOSIS — R413 Other amnesia: Secondary | ICD-10-CM | POA: Diagnosis not present

## 2017-03-07 DIAGNOSIS — F3177 Bipolar disorder, in partial remission, most recent episode mixed: Secondary | ICD-10-CM | POA: Diagnosis not present

## 2017-03-07 DIAGNOSIS — M189 Osteoarthritis of first carpometacarpal joint, unspecified: Secondary | ICD-10-CM

## 2017-03-07 DIAGNOSIS — M79644 Pain in right finger(s): Secondary | ICD-10-CM | POA: Diagnosis not present

## 2017-03-07 DIAGNOSIS — M6740 Ganglion, unspecified site: Secondary | ICD-10-CM

## 2017-03-07 DIAGNOSIS — Z8679 Personal history of other diseases of the circulatory system: Secondary | ICD-10-CM | POA: Diagnosis not present

## 2017-03-07 DIAGNOSIS — J449 Chronic obstructive pulmonary disease, unspecified: Secondary | ICD-10-CM

## 2017-03-07 DIAGNOSIS — M19041 Primary osteoarthritis, right hand: Secondary | ICD-10-CM

## 2017-03-07 DIAGNOSIS — M5136 Other intervertebral disc degeneration, lumbar region: Secondary | ICD-10-CM | POA: Diagnosis not present

## 2017-03-07 DIAGNOSIS — F308 Other manic episodes: Secondary | ICD-10-CM | POA: Diagnosis not present

## 2017-03-07 DIAGNOSIS — M62838 Other muscle spasm: Secondary | ICD-10-CM | POA: Diagnosis not present

## 2017-03-07 DIAGNOSIS — M25511 Pain in right shoulder: Secondary | ICD-10-CM

## 2017-03-07 DIAGNOSIS — G8929 Other chronic pain: Secondary | ICD-10-CM | POA: Diagnosis not present

## 2017-03-07 DIAGNOSIS — M542 Cervicalgia: Secondary | ICD-10-CM | POA: Diagnosis not present

## 2017-03-07 MED ORDER — ALBUTEROL SULFATE HFA 108 (90 BASE) MCG/ACT IN AERS: 1.0000 | INHALATION_SPRAY | RESPIRATORY_TRACT | 11 refills | Status: DC | PRN

## 2017-03-07 MED ORDER — LEVOTHYROXINE SODIUM 50 MCG PO TABS: 50.0000 ug | ORAL_TABLET | Freq: Every day | ORAL | 1 refills | Status: DC

## 2017-03-07 MED ORDER — DESONIDE 0.05 % EX CREA: TOPICAL_CREAM | Freq: Two times a day (BID) | CUTANEOUS | 3 refills | Status: DC

## 2017-03-07 MED ORDER — BENZONATATE 200 MG PO CAPS: 200.0000 mg | ORAL_CAPSULE | Freq: Three times a day (TID) | ORAL | 6 refills | Status: DC | PRN

## 2017-03-07 MED ORDER — TRIAMTERENE-HCTZ 37.5-25 MG PO CAPS: 1.0000 | ORAL_CAPSULE | Freq: Two times a day (BID) | ORAL | 1 refills | Status: DC

## 2017-03-07 MED ORDER — FOLIC ACID 1 MG PO TABS: 1.0000 mg | ORAL_TABLET | Freq: Every day | ORAL | 3 refills | Status: DC

## 2017-03-07 MED ORDER — CYCLOBENZAPRINE HCL 10 MG PO TABS: 10.0000 mg | ORAL_TABLET | Freq: Three times a day (TID) | ORAL | 3 refills | Status: DC | PRN

## 2017-03-07 MED ORDER — PANTOPRAZOLE SODIUM 40 MG PO TBEC: 40.0000 mg | DELAYED_RELEASE_TABLET | Freq: Every day | ORAL | 3 refills | Status: DC

## 2017-03-07 MED ORDER — ONDANSETRON 4 MG PO TBDP: 4.0000 mg | ORAL_TABLET | Freq: Three times a day (TID) | ORAL | 6 refills | Status: DC | PRN

## 2017-03-07 MED ORDER — DONEPEZIL HCL 10 MG PO TABS: 10.0000 mg | ORAL_TABLET | Freq: Every day | ORAL | 3 refills | Status: DC

## 2017-03-07 MED ORDER — FLUTICASONE-UMECLIDIN-VILANT 100-62.5-25 MCG/INH IN AEPB: 1.0000 | INHALATION_SPRAY | Freq: Every day | RESPIRATORY_TRACT | 11 refills | Status: DC

## 2017-03-07 MED ORDER — ASPIRIN EC 81 MG PO TBEC: 81.0000 mg | DELAYED_RELEASE_TABLET | Freq: Every day | ORAL | 3 refills | Status: DC

## 2017-03-07 MED ORDER — NAPROXEN 500 MG PO TABS: 500.0000 mg | ORAL_TABLET | Freq: Two times a day (BID) | ORAL | 3 refills | Status: DC

## 2017-03-07 MED ORDER — POTASSIUM CHLORIDE CRYS ER 20 MEQ PO TBCR: 20.0000 meq | EXTENDED_RELEASE_TABLET | Freq: Two times a day (BID) | ORAL | 3 refills | Status: DC

## 2017-03-07 NOTE — Progress Notes (Signed)
Anita Krause is a 77 y.o. right-hand-dominant female who presents to [facility] today for shoulder pain, thumb pain, rib pain, back pain.   Thumb Pain: Anita Krause notes a history of chronic pain in the right thumb interphalangeal joint.  She attributes this pain to an injury resulting in an avulsion fracture 50 years ago.  She notes 3 months ago she was doing well with only minimal pain in develop worsening swelling and pain.  She denies any recent repeat injury or change in activity.  She has been treating her pain with taping across the interphalangeal joint to reduce motion which does help.  She denies any significant numbness or weakness.  She notes pain is worse with activity and better with rest.  Shoulder pain: Ongoing now for years.  Pain is located at the right anterior shoulder and lateral upper arm.  She also notes some pain into her lateral chest wall.  The shoulder pain and chest wall pain is worse with arm motion especially overhead reaching and reaching back.  She also notes pain especially in the lateral upper arm at bedtime.  She denies any recent injury to her arm.  She denies significant radiating pain weakness or numbness.  She has not tried much treatment yet.  Rib pain: As noted above associated with arm motion.  No severe chest pain or palpitations.  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  She notes chronic pain rated as mild to moderate worsening about 3 weeks ago located in the right buttocks.  She denies significant pain radiating below the level of the buttocks down the leg.  She denies significant weakness or numbness.   Past Medical History:  Diagnosis Date  . Bipolar 1 disorder [facility])    Past Surgical History:  Procedure Laterality Date  . ABDOMINAL HYSTERECTOMY    . ANEURYSM COILING    . BACK SURGERY    . POLYPECTOMY     VOCAL  CORD   Social History   Tobacco Use  . Smoking status: Former Smoker    Types: Cigarettes  . Smokeless tobacco: Never Used  . Tobacco comment: 03/07/17 - as per patient stopped months ago  Substance Use Topics  . Alcohol use: No    Frequency: Never     ROS:  As above   Medications: Current Outpatient Medications  Medication Sig Dispense Refill  . albuterol (PROVENTIL HFA;VENTOLIN HFA) 108 (90 Base) MCG/ACT inhaler Inhale 1-2 puffs into the lungs every 4 (four) hours as needed for wheezing or shortness of breath. 2 Inhaler 11  . aspirin EC 81 MG tablet Take 1 tablet (81 mg total) by mouth daily. For prevention of heart attack/stroke 90 tablet 3  . benzonatate (TESSALON) 200 MG capsule Take 1 capsule (200 mg total) by mouth 3 (three) times daily as needed for cough. 30 capsule 6  . cyclobenzaprine (FLEXERIL) 10 MG tablet Take 1 tablet (10 mg total) by mouth 3 (three) times daily as needed for muscle spasms. 30 tablet 3  . desonide (DESOWEN) 0.05 % cream Apply topically 2 (two) times daily. As needed for psoriasis 30 g 3  . donepezil (ARICEPT) 10 MG tablet Take 1 tablet (10 mg total) by mouth at bedtime. For memory/dementia 90 tablet 3  . Fluticasone-Umeclidin-Vilant (TRELEGY ELLIPTA) 100-62.5-25 MCG/INH AEPB Inhale 1 Inhaler into the lungs daily. For chronic bronchitis / COPD 15 each 42  .  folic acid (FOLVITE) 1 MG tablet Take 1 tablet (1 mg total) by mouth daily. Type of vitamin 90 tablet 3  . hydrOXYzine (ATARAX/VISTARIL) 25 MG tablet Take 25 mg by mouth 2 (two) times daily as needed for anxiety.    Marland Kitchen levothyroxine (SYNTHROID, LEVOTHROID) 50 MCG tablet Take 1 tablet (50 mcg total) by mouth daily before breakfast. For low functioning thyroid 90 tablet 1  . naproxen (NAPROSYN) 500 MG tablet Take 1 tablet (500 mg total) by mouth 2 (two) times daily with a meal. For pain/arthritis 180 tablet 3  . ondansetron (ZOFRAN-ODT) 4 MG disintegrating tablet Take 1 tablet (4 mg total) by mouth every  8 (eight) hours as needed for nausea or vomiting. 20 tablet 6  . pantoprazole (PROTONIX) 40 MG tablet Take 1 tablet (40 mg total) by mouth daily. For acid reflux 90 tablet 3  . potassium chloride SA (K-DUR,KLOR-CON) 20 MEQ tablet Take 1 tablet (20 mEq total) by mouth 2 (two) times daily. For low potassium on your high blood pressure medicine 180 tablet 3  . traZODone (DESYREL) 100 MG tablet TAKE 1 TABLET(100 MG) BY MOUTH AT BEDTIME 90 tablet 1  . triamterene-hydrochlorothiazide (DYAZIDE) 37.5-25 MG capsule Take 1 each (1 capsule total) by mouth 2 (two) times daily. For high blood pressure 180 capsule 1   No current facility-administered medications for this visit.    Allergies  Allergen Reactions  . Erythromycin Anaphylaxis    Throat closes up, ling in mouth peals  . Penicillins Anaphylaxis  . Lamotrigine Rash  . Macrobid [Nitrofurantoin Macrocrystal]   . Welchol [Colesevelam Hcl]      Exam:  BP (!) 129/55   Pulse 100   Ht 5' 2.5" (1.588 m)   Wt 138 lb (62.6 kg)   BMI 24.84 kg/m  General: Well Developed, well nourished, and in no acute distress.  Neuro/Psych: Alert and oriented x3, extra-ocular muscles intact, able to move all 4 extremities, sensation grossly intact.  Thought process seems a bit pressured or tangential. Skin: Warm and dry, no rashes noted.  Respiratory: Not using accessory muscles, speaking in full sentences, trachea midline.  Cardiovascular: Pulses palpable, no extremity edema. Abdomen: Does not appear distended. MSK:  Right shoulder: Normal-appearing with no erythema or swelling Range of motion full but painful with abduction beyond 120 degrees and internal rotation past the level of the lumbar spine. Mildly tender to palpation overlying the acromioclavicular joint and trapezius. Positive Hawkins and Neer's test. Strength is intact but pain with abduction. Positive empty can test  Ribs: Tender to palpation right lateral to posterior chest wall  Right  thumb: Nodule present at the radial aspect of the right thumb interphalangeal joint tender to touch.  Thumb motion is normal strength is normal.  Sensation is intact.  Capillary refill is intact.  L-spine: Well-appearing midline scar at the lumbar spine Nontender to spinal midline. Mildly tender to palpation right SI joint.   Tender palpation into the right buttocks Lower extremity strength is intact. Normal gait.  Independent review of the following x-ray images:  Right shoulder: No significant DJD of the glenohumeral joint.  No acute fractures.  Right thumb: Significant degenerative changes seen at the interphalangeal joint with old avulsion fragment aspect  Chest: No obvious rib fractures.  No significant infiltrates.  L-spine: Diffuse degenerative changes throughout with normal alignment.  No fracture seen.  Awaiting formal radiology review   Limited musculoskeletal ultrasound of the right interphalangeal joint reveals avulsion fleck at the proximal end of the  distal phalanx in the area of swelling and pain.  Additional small ganglion cyst seen at the radial thumb interphalangeal joint.  Intact extensor and flexion tendons   MRI L-spine from December 10, 1998 reviewed and summarized below images not available for review IMPRESSION 1.  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 2.  SURGICAL CHANGES WITH EPIDURAL FIBROSIS AND MILD SPINAL STENOSIS L4-5. 3.  CENTRAL DISC PROTRUSION AND SPURRING CHANGE WITH MASS EFFECT ON THE ANTERIOR THECAL SAC AND MODERATE SPINAL STENOSIS AT L5-S1  Procedure: Real-time Ultrasound Guided Injection of right thumb IP joint and ganglion cyst injection  Device: GE Logiq E   Images permanently stored and available for review in the ultrasound unit. Verbal informed consent obtained.  Discussed risks and benefits of procedure. Warned about infection bleeding damage to structures skin  hypopigmentation and fat atrophy among others. Patient expresses understanding and agreement Time-out conducted.   Noted no overlying erythema, induration, or other signs of local infection.   Skin prepped in a sterile fashion.   Local anesthesia: Topical Ethyl chloride.   With sterile technique and under real time ultrasound guidance:  5mg  dexamethasone and 0.46ml lidocaine  injected into the ganglion cyst and then the needle was redirected into the joint for joint injection. .   Completed without difficulty   Pain immediately resolved suggesting accurate placement of the medication.   Advised to call if fevers/chills, erythema, induration, drainage, or persistent bleeding.   Images permanently stored and available for review in the ultrasound unit.  Impression: Technically successful ultrasound guided injection.       Assessment and Plan: 77 y.o. female with Multiple joint and orthopedic complaints.   1) right shoulder pain: This is an ongoing issue and very likely due to subacromial bursitis/rotator cuff tendinitis.  Plan for continuation of her existing home exercise program.  Plan to recheck in the next week or so.  At that point I think is reasonable to proceed with ultrasound-guided subacromial injection.  We did not proceed with the injection today as I would like to avoid extensive steroid use.  2) rib pain: Doubtful for rib fractures.  Probably related to serratus anterior strain due to shoulder pain scapular dysfunction.  Plan for home exercise program.  Patient declined referral to physical therapy.  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  Status post injection today.  Plan for continued taping as needed.  Recheck in a few weeks.  Next step would be referral to hand therapy.  4) back pain: Significant degenerative changes seen.  However she seems to be having an acute exacerbation of back pain likely related to myofascial strain and disruption.  I think she would significantly  benefit from physical therapy however patient declined referral.  She elects for trial of home exercises.  Of note: Anita Krause left the clinic after getting x-rays and went to [employer] and returned about an hour later to finish the visit.  I stated that this is not typical or normal behavior and if she were to do this again she may not be seen as I was able to work her in to the remaining schedule.  Additionally she had several encounters with members of staff where she was short tempered or use curse words or was disrespectful.  Given her history of bipolar disorder and her slightly pressured speech today on exam I am concerned that she may be having a manic or hypomanic episode.  I discussed the case with her primary care provider who is currently attempting to advance Anita Krause to start treatment.  We will recheck here in the near future.    Orders Placed This Encounter  Procedures  . 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    Answer:   [facility]    Order Specific Question:   Radiology Contrast Protocol - do NOT remove file path    Answer:   \\[email or web address]  . DG Finger Thumb Right    Order Specific Question:   Reason for exam:    Answer:   eval pain right IP joint mass    Order Specific Question:   Preferred imaging location?    Answer:   [facility]  . DG Lumbar Spine Complete    Standing Status:   Future    Number of Occurrences:   1    Standing Expiration Date:   05/08/2018    Order Specific Question:   Reason for Exam (SYMPTOM  OR DIAGNOSIS REQUIRED)    Answer:   eval lumbar spine pain    Order Specific Question:   Preferred imaging  location?    Answer:   [facility]    Order Specific Question:   Radiology Contrast Protocol - do NOT remove file path    Answer:   \\[email or web address].[email or web address]  . DG Cervical Spine Complete    Standing Status:   Future    Standing Expiration Date:   05/08/2018    Order Specific Question:   Reason for Exam (SYMPTOM  OR DIAGNOSIS REQUIRED)    Answer:   neck pain    Order Specific Question:   Preferred imaging location?    Answer:   [facility]    Order Specific Question:   Radiology Contrast Protocol - do NOT remove file path    Answer:   \\[email or web address]   No orders of the defined types were placed in this encounter.   Discussed warning signs or symptoms. Please see discharge instructions. Patient expresses understanding.  I spent 40 minutes with this patient, greater than 50% was face-to-face time counseling regarding treatment plan and diagnosis. Marland Kitchen

## 2017-03-07 NOTE — Telephone Encounter (Signed)
Pt came into clinic today for an appointment with both Anita Krause and Anita Krause. She was visibly in a manic state. I have routed this message to multiple people from both Primary care and Radiology so their encounters with her today can be documented.

## 2017-03-07 NOTE — Telephone Encounter (Signed)
Pt presented to imaging for multiple X Rays and multiple complaints. Pt was very uneasy pushy and being vulger with Larene Beach and I . We tried to ease the pt and get her imaging done as quick as possible. Pt did not want to be touched only told how to move and where and then decided to tell us how to do the xrays. Pt left xray and was heading back upstairs.

## 2017-03-07 NOTE — Patient Instructions (Addendum)
I have refilled all of the medications for you with the exception of the hydroxyzine, trazodone. Your psychiatrist will be taking care of these prescriptions to help treat anxiety and sleep.  Please see the attached medication list, I have written down the reasons that you're taking all the medications.  I know you don't want to come to the doctor very often, but any patient of mine who is being treated for blood pressure and thyroid issues needs to check up at least twice per year. I will not continue to write prescriptions for you if you do not keep follow-up appointments.  Making a few changes to the medications:  You should not be taking aspirin 325 mg along with the naproxen, this can predispose you to serious deadly stomach bleed. We are changing aspirin to enteric-coated 81 mg.  I'm refilling everything for a year with the exception of your thyroid medicine and your blood pressure medicines and she will need to follow up in 6 months to get blood pressure rechecked and to get thyroid labs looked at.

## 2017-03-07 NOTE — Progress Notes (Signed)
HPI: Anita Krause is a 77 y.o. female who  has a past medical history of Bipolar 1 disorder ([place]).  she presents to [facility] today, 03/07/17,  for chief complaint of: Pill check - medication reconciliation  Patient has been requesting refills for medications that were not on her list. She was asked to come to the office for visit renal help bottles with her.  Patient has a history of poorly controlled bipolar disorder and noncompliance with medications for this issue. She is typically accompanied by her daughter but patient is in the clinic alone today. States that she is no longer going to be seeing her psychiatrist, but then states she will just see this person as needed. She states that she is not taking the Seroquel. She is requesting refills for sleep medication trazodone. Reviewed available records from Dr. Jessy Oto, last visit there was 02/22/2017. Looks like at that visit alprazolam and hydroxyzine were prescribed. Looks like Seroquel was in fact discontinued reason being noncompliance. Not able to view a full medication list  She is very impatient today. She is wandering in and out of the room as I am attempting to reconcile medications with my computer. She has an appointment with sports medicine after her visit with me, she is very frustrated that Dr. Georgina Snell is keeping her eating though it is not yet her appointment time for him. Please see his note for full details.  She has been taking 2-3 pills per day for her BP because she has been sick,hre daughter has been sick w/ some sort of bacterial infection, she is protecting her daughter...       Past medical, surgical, social and family history reviewed:  Patient Active Problem List   Diagnosis Date Noted  . Bipolar 1 disorder, mixed, partial remission ([place]) 09/21/2016  . 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 History of hypertension 09/21/2016  . Tobacco abuse disorder 09/21/2016   . Chronic bronchitis ([place]) 09/21/2016  . Functional memory problem 09/21/2016  . Muscle spasm 09/21/2016  . Anxiety 09/21/2016  . Adjustment disorder with disturbance of emotion 08/29/2016    Past Surgical History:  Procedure Laterality Date  . ABDOMINAL HYSTERECTOMY    . ANEURYSM COILING    . BACK SURGERY    . POLYPECTOMY     VOCAL CORD    Social History   Tobacco Use  . Smoking status: Current Every Day Smoker    Types: Cigarettes  . Smokeless tobacco: Never Used  Substance Use Topics  . Alcohol use: No    Frequency: Never    Family History  Problem Relation Age of Onset  . Hyperlipidemia Mother   . Hypertension Mother   . Diabetes Mother   . Hyperlipidemia Father   . Hypertension Father   . Cancer Sister      Current medication list and allergy/intolerance information reviewed:    Current Outpatient Medications:  .  albuterol (PROVENTIL HFA;VENTOLIN HFA) 108 (90 Base) MCG/ACT inhaler, Inhale 1-2 puffs into the lungs every 4 (four) hours as needed for wheezing or shortness of breath., Disp: 2 Inhaler, Rfl: 11 .  benzonatate (TESSALON) 200 MG capsule, Take 1 capsule (200 mg total) by mouth 3 (three) times daily as needed for cough., Disp: 30 capsule, Rfl: 6 .  cyclobenzaprine (FLEXERIL) 10 MG tablet, Take 1 tablet (10 mg total) by mouth 3 (three) times daily as needed for muscle spasms., Disp: 30 tablet, Rfl: 3 .  desonide (DESOWEN) 0.05 %  cream, Apply topically 2 (two) times daily. As needed for psoriasis, Disp: 30 g, Rfl: 3 .  Fluticasone-Umeclidin-Vilant (TRELEGY ELLIPTA) 100-62.5-25 MCG/INH AEPB, Inhale 1 Inhaler into the lungs daily. For chronic bronchitis / COPD, Disp: 55 each, Rfl: 11 .  folic acid (FOLVITE) 1 MG tablet, Take 1 tablet (1 mg total) by mouth daily. Type of vitamin, Disp: 90 tablet, Rfl: 3 .  hydrOXYzine (ATARAX/VISTARIL) 25 MG tablet, Take 25 mg by mouth 2 (two) times daily as needed for anxiety., Disp: , Rfl:  .  levothyroxine (SYNTHROID,  LEVOTHROID) 50 MCG tablet, Take 1 tablet (50 mcg total) by mouth daily before breakfast. For low functioning thyroid, Disp: 90 tablet, Rfl: 1 .  naproxen (NAPROSYN) 500 MG tablet, Take 1 tablet (500 mg total) by mouth 2 (two) times daily with a meal. For pain/arthritis, Disp: 180 tablet, Rfl: 3 .  ondansetron (ZOFRAN-ODT) 4 MG disintegrating tablet, Take 1 tablet (4 mg total) by mouth every 8 (eight) hours as needed for nausea or vomiting., Disp: 20 tablet, Rfl: 6 .  pantoprazole (PROTONIX) 40 MG tablet, Take 1 tablet (40 mg total) by mouth daily. For acid reflux, Disp: 90 tablet, Rfl: 3 .  potassium chloride SA (K-DUR,KLOR-CON) 20 MEQ tablet, Take 1 tablet (20 mEq total) by mouth 2 (two) times daily. For low potassium on your high blood pressure medicine, Disp: 180 tablet, Rfl: 3 .  traZODone (DESYREL) 100 MG tablet, TAKE 1 TABLET(100 MG) BY MOUTH AT BEDTIME, Disp: 90 tablet, Rfl: 1 .  triamterene-hydrochlorothiazide (DYAZIDE) 37.5-25 MG capsule, Take 1 each (1 capsule total) by mouth 2 (two) times daily. For high blood pressure, Disp: 180 capsule, Rfl: 1 .  aspirin EC 81 MG tablet, Take 1 tablet (81 mg total) by mouth daily. For prevention of heart attack/stroke, Disp: 90 tablet, Rfl: 3 .  donepezil (ARICEPT) 10 MG tablet, Take 1 tablet (10 mg total) by mouth at bedtime. For memory/dementia, Disp: 90 tablet, Rfl: 3   Allergies  Allergen Reactions  . Erythromycin Anaphylaxis    Throat closes up, ling in mouth peals  . Penicillins Anaphylaxis  . Lamotrigine Rash  . Macrobid [Nitrofurantoin Macrocrystal]   . Welchol [Colesevelam Hcl]       Review of Systems:  Constitutional:  No  fever, no chills  HEENT: No  headache, no vision change  Cardiac: No  chest pain  Respiratory:  No  shortness of breath.  Gastrointestinal: No  abdominal pain  Musculoskeletal: No new myalgia/arthralgia  Neurologic: No  weakness, No  dizziness  Psychiatric: +concerns with depression, +concerns with  anxiety, +sleep problems, +mood problems  Exam:  BP (!) 129/55   Pulse (!) 101   Temp 98 F (36.7 C) (Oral)   Ht 5' 2.5" (1.588 m)   Wt 138 lb 0.6 oz (62.6 kg)   BMI 24.85 kg/m   Constitutional: VS see above. General Appearance: alert, well-developed, well-nourished, NAD  Eyes: Normal lids and conjunctive, non-icteric sclera  Ears, Nose, Mouth, Throat: MMM, Normal external inspection ears/nares/mouth/lips/gums.  Neck: No masses, trachea midline. No thyroid enlargement. No tenderness/mass appreciated. No lymphadenopathy  Respiratory: Normal respiratory effort. no wheeze, no rhonchi, no rales  Cardiovascular: S1/S2 normal, no murmur, no rub/gallop auscultated. RRR.   Musculoskeletal: Gait normal.   Neurological: Normal balance/coordination. No tremor.  Skin: warm, dry, intact.  Psychiatric: Poor judgment/insight. Manic mood and affect. Oriented x3. No suicidal ideation or homicidal ideation, asked directly about the pH Q9 questionnaire results.     ASSESSMENT/PLAN: The primary encounter  diagnosis was Bipolar 1 disorder, mixed, partial remission ([place]). Diagnoses of Chronic obstructive pulmonary disease, unspecified COPD type ([place]), History of hypertension, Functional memory problem, and Muscle spasm were also pertinent to this visit.  Ms. Pau is obviously exhibiting some manic behaviors today. She left prior to her appointment with Dr. Georgina Snell but then later came back, he was kind enough to see her anyway. See Dr. Clovis Riley note for further details. For instance, going downstairs to x-ray and then leaving the building to go to both ankles and to the cell phone store prior to coming back to complete her visit with him. We'll attempt to reach out to the patient's daughter and will forward this information to her psychiatrist as well. Not sure if she was missed from this practice and not why she states she is no longer going back there. She is not taking her mood stabilizer medication,  I instructed her that this was certainly in her best interest that she states that she doesn't need it.  Sensitive medication reconciliation was performed based on how bottles, confirms some from her medication list as well that she didn't have the bottles 4. See below for list of everything that was refilled for her today. I also updated the prescription and instructions with the reasons why she is taking all of these medications.  Patient Instructions  I have refilled all of the medications for you with the exception of the hydroxyzine, trazodone. Your psychiatrist will be taking care of these prescriptions to help treat anxiety and sleep.  Please see the attached medication list, I have written down the reasons that you're taking all the medications.  I know you don't want to come to the doctor very often, but any patient of mine who is being treated for blood pressure and thyroid issues needs to check up at least twice per year. I will not continue to write prescriptions for you if you do not keep follow-up appointments.  Making a few changes to the medications:  You should not be taking aspirin 325 mg along with the naproxen, this can predispose you to serious deadly stomach bleed. We are changing aspirin to enteric-coated 81 mg.  I'm refilling everything for a year with the exception of your thyroid medicine and your blood pressure medicines and she will need to follow up in 6 months to get blood pressure rechecked and to get thyroid labs looked at.    Meds ordered this encounter  Medications  . albuterol (PROVENTIL HFA;VENTOLIN HFA) 108 (90 Base) MCG/ACT inhaler    Sig: Inhale 1-2 puffs into the lungs every 4 (four) hours as needed for wheezing or shortness of breath.    Dispense:  2 Inhaler    Refill:  11  . aspirin EC 81 MG tablet    Sig: Take 1 tablet (81 mg total) by mouth daily. For prevention of heart attack/stroke    Dispense:  90 tablet    Refill:  3  . benzonatate  (TESSALON) 200 MG capsule    Sig: Take 1 capsule (200 mg total) by mouth 3 (three) times daily as needed for cough.    Dispense:  30 capsule    Refill:  6  . desonide (DESOWEN) 0.05 % cream    Sig: Apply topically 2 (two) times daily. As needed for psoriasis    Dispense:  30 g    Refill:  3  . donepezil (ARICEPT) 10 MG tablet    Sig: Take 1 tablet (10 mg total) by  mouth at bedtime. For memory/dementia    Dispense:  90 tablet    Refill:  3  . Fluticasone-Umeclidin-Vilant (TRELEGY ELLIPTA) 100-62.5-25 MCG/INH AEPB    Sig: Inhale 1 Inhaler into the lungs daily. For chronic bronchitis / COPD    Dispense:  60 each    Refill:  11  . folic acid (FOLVITE) 1 MG tablet    Sig: Take 1 tablet (1 mg total) by mouth daily. Type of vitamin    Dispense:  90 tablet    Refill:  3  . levothyroxine (SYNTHROID, LEVOTHROID) 50 MCG tablet    Sig: Take 1 tablet (50 mcg total) by mouth daily before breakfast. For low functioning thyroid    Dispense:  90 tablet    Refill:  1  . naproxen (NAPROSYN) 500 MG tablet    Sig: Take 1 tablet (500 mg total) by mouth 2 (two) times daily with a meal. For pain/arthritis    Dispense:  180 tablet    Refill:  3  . ondansetron (ZOFRAN-ODT) 4 MG disintegrating tablet    Sig: Take 1 tablet (4 mg total) by mouth every 8 (eight) hours as needed for nausea or vomiting.    Dispense:  20 tablet    Refill:  6  . pantoprazole (PROTONIX) 40 MG tablet    Sig: Take 1 tablet (40 mg total) by mouth daily. For acid reflux    Dispense:  90 tablet    Refill:  3  . potassium chloride SA (K-DUR,KLOR-CON) 20 MEQ tablet    Sig: Take 1 tablet (20 mEq total) by mouth 2 (two) times daily. For low potassium on your high blood pressure medicine    Dispense:  180 tablet    Refill:  3  . triamterene-hydrochlorothiazide (DYAZIDE) 37.5-25 MG capsule    Sig: Take 1 each (1 capsule total) by mouth 2 (two) times daily. For high blood pressure    Dispense:  180 capsule    Refill:  1  .  cyclobenzaprine (FLEXERIL) 10 MG tablet    Sig: Take 1 tablet (10 mg total) by mouth 3 (three) times daily as needed for muscle spasms.    Dispense:  30 tablet    Refill:  3      Visit summary with medication list and pertinent instructions was printed for patient to review. All questions at time of visit were answered - patient instructed to contact office with any additional concerns. ER/RTC precautions were reviewed with the patient.   Follow-up plan: Return in about 6 months (around 09/07/2017) for Routine follow-up/annual physical exam and labs. Will certainly need to be seen sooner by her psychiatrist, looks like she has an appointment with them 04/12/17 - other chronic medical conditions are fairly stable at this time.  Note: Total time spent 40 minutes, greater than 50% of the visit was spent face-to-face counseling and coordinating care for the following: The primary encounter diagnosis was Bipolar 1 disorder, mixed, partial remission ([place]). Diagnoses of Chronic obstructive pulmonary disease, unspecified COPD type ([place]), History of hypertension, Functional memory problem, and Muscle spasm were also pertinent to this visit.Marland Kitchen  Please note: voice recognition software was used to produce this document, and typos may escape review. Please contact Dr. Sheppard Coil for any needed clarifications.

## 2017-03-07 NOTE — Patient Instructions (Signed)
Thank you for coming in today. You will hear from a vein doctor soon about the small spider veins . Call or go to the ER if you develop a large red swollen joint with extreme pain or oozing puss.  For your shoulder recheck in 2-4 weeks.  Return sooner if needed.   I will send you the xray results when available likely tomorrow.   Restart your home exercises.  If not better we can arrange for PT.

## 2017-03-08 ENCOUNTER — Telehealth: Payer: Self-pay | Admitting: Family Medicine

## 2017-03-08 DIAGNOSIS — I700 Atherosclerosis of aorta: Secondary | ICD-10-CM | POA: Insufficient documentation

## 2017-03-08 NOTE — Telephone Encounter (Signed)
Noted  

## 2017-03-08 NOTE — Telephone Encounter (Signed)
Called patient gave her results as noted below. Anita Krause,CMA

## 2017-03-08 NOTE — Telephone Encounter (Signed)
Lumbar x-ray showed calcifications of the aorta that looked like a possible aneurysm.  We should look into this with an abdominal aortic aneurysm ultrasound in [place].

## 2017-03-09 ENCOUNTER — Ambulatory Visit: Payer: Self-pay | Admitting: Osteopathic Medicine

## 2017-03-09 ENCOUNTER — Encounter: Payer: Self-pay | Admitting: Family Medicine

## 2017-03-09 ENCOUNTER — Encounter: Payer: Self-pay | Admitting: Osteopathic Medicine

## 2017-03-14 ENCOUNTER — Ambulatory Visit: Payer: Self-pay

## 2017-03-14 ENCOUNTER — Ambulatory Visit (INDEPENDENT_AMBULATORY_CARE_PROVIDER_SITE_OTHER): Payer: Medicare Other | Admitting: Family Medicine

## 2017-03-14 ENCOUNTER — Encounter: Payer: Self-pay | Admitting: Family Medicine

## 2017-03-14 VITALS — BP 139/79 | HR 86 | Wt 142.0 lb

## 2017-03-14 DIAGNOSIS — M545 Low back pain, unspecified: Secondary | ICD-10-CM

## 2017-03-14 DIAGNOSIS — G8929 Other chronic pain: Secondary | ICD-10-CM | POA: Diagnosis not present

## 2017-03-14 DIAGNOSIS — M4696 Unspecified inflammatory spondylopathy, lumbar region: Secondary | ICD-10-CM

## 2017-03-14 DIAGNOSIS — M25511 Pain in right shoulder: Secondary | ICD-10-CM | POA: Diagnosis not present

## 2017-03-14 DIAGNOSIS — M549 Dorsalgia, unspecified: Secondary | ICD-10-CM

## 2017-03-14 DIAGNOSIS — I700 Atherosclerosis of aorta: Secondary | ICD-10-CM

## 2017-03-14 DIAGNOSIS — F17200 Nicotine dependence, unspecified, uncomplicated: Secondary | ICD-10-CM | POA: Diagnosis not present

## 2017-03-14 DIAGNOSIS — J449 Chronic obstructive pulmonary disease, unspecified: Secondary | ICD-10-CM | POA: Diagnosis not present

## 2017-03-14 DIAGNOSIS — M47816 Spondylosis without myelopathy or radiculopathy, lumbar region: Secondary | ICD-10-CM

## 2017-03-14 HISTORY — DX: Dorsalgia, unspecified: M54.9

## 2017-03-14 HISTORY — DX: Other chronic pain: G89.29

## 2017-03-14 MED ORDER — PREDNISONE 5 MG (21) PO TBPK: ORAL_TABLET | ORAL | 0 refills | Status: DC

## 2017-03-14 NOTE — Progress Notes (Signed)
Anita Krause is a 77 y.o. female who presents to [facility] today for shoulder and back pain.   Anita Krause was seen last week for a plethora of MSK complaints. She had a right thumb IP joint injection which is feeling well today.   She notes that her right shoulder continues to bother her. She notes pain with overhead motion and reaching back. She notes some weakness with motion as well. She denies any significant radiating pain down the right arm as well. She had an xray last week showing no significant right shoulder GH DJD.   She also notes severe back pain. She points to the right lower back near the SI joint as her maximal area of pain. She denies any radiating pain or numbness. She had a Lspine xray last week showing severe DDD and Facet DJD. Anita Krause notes a history of good response to RF ablation in the past and a MRI within the last year in [place].   The Lspine Xray also showed aortic calcification and probable AAA. It measured on xray <4cm.   Anita Krause denies any recent trauma. She notes that she would like to try a short steroid pack oral prior to any further injections if able.    Past Medical History:  Diagnosis Date  . Bipolar 1 disorder [facility])    Past Surgical History:  Procedure Laterality Date  . ABDOMINAL HYSTERECTOMY    . ANEURYSM COILING    . BACK SURGERY    . POLYPECTOMY     VOCAL CORD   Social History   Tobacco Use  . Smoking status: Former Smoker    Types: Cigarettes  . Smokeless tobacco: Never Used  . Tobacco comment: 03/07/17 - as per patient stopped months ago  Substance Use Topics  . Alcohol use: No    Frequency: Never     ROS:  As above   Medications: Current Outpatient Medications  Medication Sig Dispense Refill  . albuterol (PROVENTIL HFA;VENTOLIN HFA) 108 (90 Base) MCG/ACT inhaler Inhale 1-2 puffs into the lungs every 4 (four) hours as needed for wheezing or shortness of breath. 2 Inhaler 11  . aspirin  EC 81 MG tablet Take 1 tablet (81 mg total) by mouth daily. For prevention of heart attack/stroke 90 tablet 3  . benzonatate (TESSALON) 200 MG capsule Take 1 capsule (200 mg total) by mouth 3 (three) times daily as needed for cough. 30 capsule 6  . cyclobenzaprine (FLEXERIL) 10 MG tablet Take 1 tablet (10 mg total) by mouth 3 (three) times daily as needed for muscle spasms. 30 tablet 3  . desonide (DESOWEN) 0.05 % cream Apply topically 2 (two) times daily. As needed for psoriasis 30 g 3  . donepezil (ARICEPT) 10 MG tablet Take 1 tablet (10 mg total) by mouth at bedtime. For memory/dementia 90 tablet 3  . Fluticasone-Umeclidin-Vilant (TRELEGY ELLIPTA) 100-62.5-25 MCG/INH AEPB Inhale 1 Inhaler into the lungs daily. For chronic bronchitis / COPD 60 each 11  . folic acid (FOLVITE) 1 MG tablet Take 1 tablet (1 mg total) by mouth daily. Type of vitamin 90 tablet 3  . hydrOXYzine (ATARAX/VISTARIL) 25 MG tablet Take 25 mg by mouth 2 (two) times daily as needed for anxiety.    Marland Kitchen levothyroxine (SYNTHROID, LEVOTHROID) 50 MCG tablet Take 1 tablet (50 mcg total) by mouth daily before breakfast. For low functioning thyroid 90 tablet 1  . naproxen (NAPROSYN) 500 MG tablet Take 1 tablet (500 mg total) by mouth 2 (two)  times daily with a meal. For pain/arthritis 180 tablet 3  . ondansetron (ZOFRAN-ODT) 4 MG disintegrating tablet Take 1 tablet (4 mg total) by mouth every 8 (eight) hours as needed for nausea or vomiting. 20 tablet 6  . pantoprazole (PROTONIX) 40 MG tablet Take 1 tablet (40 mg total) by mouth daily. For acid reflux 90 tablet 3  . potassium chloride SA (K-DUR,KLOR-CON) 20 MEQ tablet Take 1 tablet (20 mEq total) by mouth 2 (two) times daily. For low potassium on your high blood pressure medicine 180 tablet 3  . traZODone (DESYREL) 100 MG tablet TAKE 1 TABLET(100 MG) BY MOUTH AT BEDTIME 90 tablet 1  . triamterene-hydrochlorothiazide (DYAZIDE) 37.5-25 MG capsule Take 1 each (1 capsule total) by mouth 2 (two)  times daily. For high blood pressure 180 capsule 1  . predniSONE (STERAPRED UNI-PAK 21 TAB) 5 MG (21) TBPK tablet 6 day prednisone dosepack po. 21 tablet 0   No current facility-administered medications for this visit.    Allergies  Allergen Reactions  . Erythromycin Anaphylaxis    Throat closes up, ling in mouth peals  . Penicillins Anaphylaxis  . Lamotrigine Rash  . Macrobid [Nitrofurantoin Macrocrystal]   . Welchol [Colesevelam Hcl]      Exam:  BP 139/79   Pulse 86   Wt 142 lb (64.4 kg)   BMI 25.56 kg/m  General: Well Developed, well nourished, and in no acute distress.  Neuro/Psych: Alert and oriented x3, extra-ocular muscles intact, able to move all 4 extremities, sensation grossly intact. Skin: Warm and dry, no rashes noted.  Respiratory: Not using accessory muscles, speaking in full sentences, trachea midline.  Cardiovascular: Pulses palpable, no extremity edema. Abdomen: Does not appear distended. MSK:   Right shoulder: Normal-appearing with no erythema or swelling Range of motion full but painful with abduction beyond 120 degrees and internal rotation past the level of the lumbar spine. Mildly tender to palpation overlying the acromioclavicular joint and trapezius.   L-spine: Well-appearing midline scar at the lumbar spine Nontender to spinal midline. Mildly tender to palpation right SI joint.   Tender palpation into the right buttocks Lower extremity strength is intact. Normal gait.  known injury.  EXAM: LUMBAR SPINE - COMPLETE 4+ VIEW  COMPARISON:  None.  FINDINGS: Mild leftward scoliosis. Diffuse advanced degenerative disc and facet disease throughout the lumbar spine. No fracture or malalignment. SI joints are symmetric and unremarkable. Aortoiliac atherosclerosis. There may be mild aneurysmal dilatation of the abdominal aorta which measures 3.8 cm on plain films. There may be magnification issues on this study. This could be further  evaluated with abdominal CT or ultrasound.  IMPRESSION: Diffuse degenerative disc and facet disease. No acute bony abnormality.  Aortoiliac atherosclerosis. There may be magnification issues by plain films.) This this could be further evaluated with abdominal ultrasound or CT.   Electronically Signed   By: Rolm Baptise M.D.   On: 03/08/2017 08:34  EXAM: RIGHT SHOULDER - 2+ VIEW  COMPARISON:  None.  FINDINGS: Degenerative changes in the [facility] joint with joint space narrowing and spurring. Glenohumeral joint is maintained. No acute bony abnormality. Specifically, no fracture, subluxation, or dislocation. Soft tissues are intact.  IMPRESSION: Degenerative changes in the right AC joint. No acute bony abnormality.   Electronically Signed   By: Rolm Baptise M.D.   On: 03/08/2017 08:32 I personally (independently) visualized and performed the interpretation of the images attached in this note.     Assessment and Plan: 77 y.o. female with  Right  shoulder pain: Likely RTC tendonitis vs bursitis.  Plan home exercise plan and trial of 6 day dosepack. Recheck in 1 week.   Lspine: DDD and facet DJD. Trial steroid burst. Will obtain MRI report and likely proceed with RF ablation.   Probable AAA: I called and arranged a vascular ultrasound of the abdominal aorta tomorrow. Will follow up results with Anita Krause in clinic next week.    No orders of the defined types were placed in this encounter.  Meds ordered this encounter  Medications  . predniSONE (STERAPRED UNI-PAK 21 TAB) 5 MG (21) TBPK tablet    Sig: 6 day prednisone dosepack po.    Dispense:  21 tablet    Refill:  0    Discussed warning signs or symptoms. Please see discharge instructions. Patient expresses understanding.

## 2017-03-14 NOTE — Patient Instructions (Addendum)
Thank you for coming in today. Try the prednisone pack for 6 days.  Be sure to get the ultrasound of your belly for aneurysm.  [address] [place], [place] [address]  [phone] Recheck in 1 month or so.   Please have your doctor send records.  Recheck in 1 week.

## 2017-03-15 ENCOUNTER — Inpatient Hospital Stay (HOSPITAL_COMMUNITY)
Admission: RE | Admit: 2017-03-15 | Discharge: 2017-03-15 | Disposition: A | Discharge status: Home / Self Care | Payer: Self-pay | Source: Ambulatory Visit | Attending: Family Medicine | Admitting: Family Medicine

## 2017-03-15 DIAGNOSIS — I700 Atherosclerosis of aorta: Secondary | ICD-10-CM

## 2017-04-06 ENCOUNTER — Emergency Department (INDEPENDENT_AMBULATORY_CARE_PROVIDER_SITE_OTHER): Payer: Medicare Other

## 2017-04-06 ENCOUNTER — Other Ambulatory Visit: Payer: Self-pay

## 2017-04-06 ENCOUNTER — Emergency Department (INDEPENDENT_AMBULATORY_CARE_PROVIDER_SITE_OTHER)
Admission: EM | Admit: 2017-04-06 | Discharge: 2017-04-06 | Disposition: A | Discharge status: Home / Self Care | Payer: Medicare Other | Source: Home / Self Care | Attending: Family Medicine | Admitting: Family Medicine

## 2017-04-06 ENCOUNTER — Encounter: Payer: Self-pay | Admitting: Emergency Medicine

## 2017-04-06 ENCOUNTER — Emergency Department
Admission: EM | Admit: 2017-04-06 | Discharge: 2017-04-06 | Discharge status: Absent without leave | Payer: Medicare Other | Source: Home / Self Care

## 2017-04-06 DIAGNOSIS — M25551 Pain in right hip: Secondary | ICD-10-CM

## 2017-04-06 DIAGNOSIS — M1611 Unilateral primary osteoarthritis, right hip: Secondary | ICD-10-CM | POA: Diagnosis not present

## 2017-04-06 DIAGNOSIS — M5137 Other intervertebral disc degeneration, lumbosacral region: Secondary | ICD-10-CM

## 2017-04-06 MED ORDER — METHYLPREDNISOLONE ACETATE 80 MG/ML IJ SUSP: 80.0000 mg | Freq: Once | INTRAMUSCULAR | Status: DC

## 2017-04-06 NOTE — ED Triage Notes (Signed)
Patient reports pain all along the right side of her body; especially noticeable at night when trying to sleep/get comfortable;; this has been going on for about 3 weeks.

## 2017-04-06 NOTE — Discharge Instructions (Addendum)
Apply ice pack for 20 to 30 minutes, 3 to 4 times daily  Continue until pain decreases.

## 2017-04-06 NOTE — ED Provider Notes (Signed)
[name]    CSN: [id number] Arrival date & time: 04/06/17  1546     History   Chief Complaint Chief Complaint  Patient presents with  . Flank Pain  . Hip Pain  . Shoulder Pain    HPI Anita Krause is a 77 y.o. female.   Patient complains of 3 to 4 week history of pain in her right side, worse at night.  She points to her right lower back, right hip, and right lateral thigh.  She recalls no injury.   She denies bowel or bladder dysfunction, and no saddle numbness.   The history is provided by the patient.  Hip Pain  This is a chronic problem. Episode onset: 3 to 4 weeks. The problem occurs constantly. The problem has not changed since onset.Pertinent negatives include no chest pain and no abdominal pain. Exacerbated by: supine at night on right side. Nothing relieves the symptoms. She has tried nothing for the symptoms.    Past Medical History:  Diagnosis Date  . Bipolar 1 disorder [facility])     Patient Active Problem List   Diagnosis Date Noted  . Right shoulder pain 03/14/2017  . Chronic back pain 03/14/2017  . 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 Abdominal aortic atherosclerosis ([place]) 03/08/2017  . Bipolar 1 disorder, mixed, partial remission ([place]) 09/21/2016  . 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 History of hypertension 09/21/2016  . Tobacco abuse disorder 09/21/2016  . Chronic bronchitis ([place]) 09/21/2016  . Functional memory problem 09/21/2016  . Muscle spasm 09/21/2016  . Anxiety 09/21/2016  . Adjustment disorder with disturbance of emotion 08/29/2016    Past Surgical History:  Procedure Laterality Date  . ABDOMINAL HYSTERECTOMY    . ANEURYSM COILING    . BACK SURGERY    . POLYPECTOMY     VOCAL CORD    OB History   None      Home Medications    Prior to Admission medications   Medication Sig Start Date End Date Taking? Authorizing Provider  albuterol (PROVENTIL HFA;VENTOLIN HFA) 108 (90 Base) MCG/ACT  inhaler Inhale 1-2 puffs into the lungs every 4 (four) hours as needed for wheezing or shortness of breath. 03/07/17   Emeterio Reeve, DO  aspirin EC 81 MG tablet Take 1 tablet (81 mg total) by mouth daily. For prevention of heart attack/stroke 03/07/17   Emeterio Reeve, DO  benzonatate (TESSALON) 200 MG capsule Take 1 capsule (200 mg total) by mouth 3 (three) times daily as needed for cough. 03/07/17   Emeterio Reeve, DO  cyclobenzaprine (FLEXERIL) 10 MG tablet Take 1 tablet (10 mg total) by mouth 3 (three) times daily as needed for muscle spasms. 03/07/17   Emeterio Reeve, DO  desonide (DESOWEN) 0.05 % cream Apply topically 2 (two) times daily. As needed for psoriasis 03/07/17   Emeterio Reeve, DO  donepezil (ARICEPT) 10 MG tablet Take 1 tablet (10 mg total) by mouth at bedtime. For memory/dementia 03/07/17   Emeterio Reeve, DO  Fluticasone-Umeclidin-Vilant (TRELEGY ELLIPTA) 100-62.5-25 MCG/INH AEPB Inhale 1 Inhaler into the lungs daily. For chronic bronchitis / COPD 03/07/17   Emeterio Reeve, DO  folic acid (FOLVITE) 1 MG tablet Take 1 tablet (1 mg total) by mouth daily. Type of vitamin 03/07/17   Emeterio Reeve, DO  hydrOXYzine (ATARAX/VISTARIL) 25 MG tablet Take 25 mg by mouth 2 (two) times daily as needed for anxiety.    [provider]  levothyroxine (SYNTHROID, LEVOTHROID) 50 MCG tablet Take  1 tablet (50 mcg total) by mouth daily before breakfast. For low functioning thyroid 03/07/17   Emeterio Reeve, DO  naproxen (NAPROSYN) 500 MG tablet Take 1 tablet (500 mg total) by mouth 2 (two) times daily with a meal. For pain/arthritis 03/07/17   Emeterio Reeve, DO  ondansetron (ZOFRAN-ODT) 4 MG disintegrating tablet Take 1 tablet (4 mg total) by mouth every 8 (eight) hours as needed for nausea or vomiting. 03/07/17   Emeterio Reeve, DO  pantoprazole (PROTONIX) 40 MG tablet Take 1 tablet (40 mg total) by mouth daily. For acid reflux 03/07/17   Emeterio Reeve, DO    potassium chloride SA (K-DUR,KLOR-CON) 20 MEQ tablet Take 1 tablet (20 mEq total) by mouth 2 (two) times daily. For low potassium on your high blood pressure medicine 03/07/17   Emeterio Reeve, DO  predniSONE (STERAPRED UNI-PAK 21 TAB) 5 MG (21) TBPK tablet 6 day prednisone dosepack po. 03/14/17   Gregor Hams, MD  traZODone (DESYREL) 100 MG tablet TAKE 1 TABLET(100 MG) BY MOUTH AT BEDTIME 10/17/16   Emeterio Reeve, DO  triamterene-hydrochlorothiazide (DYAZIDE) 37.5-25 MG capsule Take 1 each (1 capsule total) by mouth 2 (two) times daily. For high blood pressure 03/07/17   Emeterio Reeve, DO    Family History Family History  Problem Relation Age of Onset  . Hyperlipidemia Mother   . Hypertension Mother   . Diabetes Mother   . Hyperlipidemia Father   . Hypertension Father   . Cancer Sister     Social History Social History   Tobacco Use  . Smoking status: Former Smoker    Types: Cigarettes  . Smokeless tobacco: Never Used  . Tobacco comment: 03/07/17 - as per patient stopped months ago  Substance Use Topics  . Alcohol use: No    Frequency: Never  . Drug use: No     Allergies   Erythromycin; Penicillins; Lamotrigine; Macrobid [nitrofurantoin macrocrystal]; and Welchol [colesevelam hcl]   Review of Systems Review of Systems  Cardiovascular: Negative for chest pain.  Gastrointestinal: Negative for abdominal pain.  All other systems reviewed and are negative.    Physical Exam Triage Vital Signs ED Triage Vitals  Enc Vitals Group     BP 04/06/17 1659 (!) 183/74     Pulse Rate 04/06/17 1659 (!) 110     Resp --      Temp 04/06/17 1659 98.9 F (37.2 C)     Temp Source 04/06/17 1659 Oral     SpO2 04/06/17 1659 96 %     Weight --      Height --      Head Circumference --      Peak Flow --      Pain Score 04/06/17 1700 2     Pain Loc --      Pain Edu? --      Excl. in [place]? --    No data found.  Updated Vital Signs BP (!) 183/74 (BP Location: Left Arm)  Comment: talking  Pulse (!) 110 Comment: talking  Temp 98.9 F (37.2 C) (Oral)   SpO2 96%   Visual Acuity Right Eye Distance:   Left Eye Distance:   Bilateral Distance:    Right Eye Near:   Left Eye Near:    Bilateral Near:     Physical Exam  Constitutional: She appears well-developed and well-nourished. No distress.  HENT:  Head: Normocephalic.  Right Ear: External ear normal.  Left Ear: External ear normal.  Nose: Nose normal.  Mouth/Throat: Oropharynx  is clear and moist.  Eyes: Pupils are equal, round, and reactive to light. Conjunctivae are normal.  Cardiovascular: Normal heart sounds.  Note heart rate 110  Pulmonary/Chest: Breath sounds normal.  Musculoskeletal: She exhibits no edema.  Neurological: She is alert.  Skin: Skin is warm and dry.     Patient reports pain in right hip, right lower back, and right lateral thigh as noted on diagram.  However, there is no tenderness to palpation in these areas. Back:  Range of motion relatively well preserved.  Straight leg raising test is negative.  Sitting knee extension test is negative.  Strength and sensation in the lower extremities is normal.  Patellar and achilles reflexes are normal   Nursing note and vitals reviewed.    UC Treatments / Results  Labs (all labs ordered are listed, but only abnormal results are displayed) Labs Reviewed - No data to display  EKG None Radiology Dg Hip Unilat W Or Wo Pelvis 2-3 Views Right  Result Date: 04/06/2017 CLINICAL DATA:  Right lateral hip pain x4 weeks without known injury. EXAM: DG HIP (WITH OR WITHOUT PELVIS) 2-3V RIGHT COMPARISON:  None. FINDINGS: The included lumbar spine demonstrates degenerative disc disease with disc space flattening, vacuum disc phenomenon and endplate sclerosis at Q9-4 and L5-S1. Facet arthropathy is noted at the lumbosacral junction. The arcuate lines the sacrum appear intact. Mild degenerative spurring about the SI joints bilaterally. The bony pelvis  appears intact. There is sclerosis about the pubic symphysis with joint space narrowing and degenerative subcortical cystic change of the left parasymphysis. The femoral heads are maintained within both hip joints with right slightly greater than left joint space narrowing of the right hip relative to left. The pubic rami appear intact. The proximal femora appear intact. Acetabular components are not shallow nor is there overcoverage of the femoral heads. No suspicious osseous lesions. No suspicious soft tissue abnormality. IMPRESSION: 1. Lower lumbar degenerative disc disease and facet arthropathy from L4 through S1. 2. 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 3. Mild degenerative joint space narrowing of the right hip. No acute osseous abnormality. Electronically Signed   By: Ashley Royalty M.D.   On: 04/06/2017 18:04    Procedures Procedures (including critical care time)  Medications Ordered in UC Medications  methylPREDNISolone acetate (DEPO-MEDROL) injection 80 mg (has no administration in time range)     Initial Impression / Assessment and Plan / UC Course  I have reviewed the triage vital signs and the nursing notes.  Pertinent labs & imaging results that were available during my care of the patient were reviewed by me and considered in my medical decision making (see chart for details).    Suspect mult-factorial etiology:  Likely combination of radiculopathy, DJD, SI joint inflammation. Administered Depo Medrol 80mg  IM  Apply ice pack for 20 to 30 minutes, 3 to 4 times daily  Continue until pain decreases.  Followup with Dr. Lynne Leader ([facility]).     Final Clinical Impressions(s) / UC Diagnoses   Final diagnoses:  Right hip pain    ED Discharge Orders    None          Kandra Nicolas, MD 04/12/17 1048

## 2017-04-08 DIAGNOSIS — R4182 Altered mental status, unspecified: Secondary | ICD-10-CM | POA: Diagnosis not present

## 2017-04-08 DIAGNOSIS — I10 Essential (primary) hypertension: Secondary | ICD-10-CM | POA: Diagnosis not present

## 2017-04-08 DIAGNOSIS — Z79899 Other long term (current) drug therapy: Secondary | ICD-10-CM | POA: Diagnosis not present

## 2017-04-08 DIAGNOSIS — F312 Bipolar disorder, current episode manic severe with psychotic features: Secondary | ICD-10-CM | POA: Diagnosis not present

## 2017-04-08 DIAGNOSIS — F17200 Nicotine dependence, unspecified, uncomplicated: Secondary | ICD-10-CM | POA: Diagnosis not present

## 2017-04-08 DIAGNOSIS — R9431 Abnormal electrocardiogram [ECG] [EKG]: Secondary | ICD-10-CM | POA: Diagnosis not present

## 2017-04-08 DIAGNOSIS — R000 Tachycardia, unspecified: Secondary | ICD-10-CM | POA: Diagnosis not present

## 2017-04-08 DIAGNOSIS — Z880 Allergy status to penicillin: Secondary | ICD-10-CM | POA: Diagnosis not present

## 2017-04-08 DIAGNOSIS — Z7982 Long term (current) use of aspirin: Secondary | ICD-10-CM | POA: Diagnosis not present

## 2017-04-08 DIAGNOSIS — R030 Elevated blood-pressure reading, without diagnosis of hypertension: Secondary | ICD-10-CM | POA: Diagnosis not present

## 2017-04-08 DIAGNOSIS — F319 Bipolar disorder, unspecified: Secondary | ICD-10-CM | POA: Diagnosis not present

## 2017-04-08 DIAGNOSIS — F23 Brief psychotic disorder: Secondary | ICD-10-CM | POA: Diagnosis not present

## 2017-04-08 DIAGNOSIS — Z883 Allergy status to other anti-infective agents status: Secondary | ICD-10-CM | POA: Diagnosis not present

## 2017-04-08 DIAGNOSIS — R930 Abnormal findings on diagnostic imaging of skull and head, not elsewhere classified: Secondary | ICD-10-CM | POA: Diagnosis not present

## 2017-04-09 DIAGNOSIS — F312 Bipolar disorder, current episode manic severe with psychotic features: Secondary | ICD-10-CM | POA: Diagnosis not present

## 2017-04-10 DIAGNOSIS — F5101 Primary insomnia: Secondary | ICD-10-CM | POA: Diagnosis present

## 2017-04-10 DIAGNOSIS — Z7982 Long term (current) use of aspirin: Secondary | ICD-10-CM | POA: Diagnosis not present

## 2017-04-10 DIAGNOSIS — F23 Brief psychotic disorder: Secondary | ICD-10-CM | POA: Diagnosis not present

## 2017-04-10 DIAGNOSIS — R9431 Abnormal electrocardiogram [ECG] [EKG]: Secondary | ICD-10-CM | POA: Diagnosis not present

## 2017-04-10 DIAGNOSIS — F3112 Bipolar disorder, current episode manic without psychotic features, moderate: Secondary | ICD-10-CM | POA: Diagnosis not present

## 2017-04-10 DIAGNOSIS — I10 Essential (primary) hypertension: Secondary | ICD-10-CM | POA: Diagnosis present

## 2017-04-10 DIAGNOSIS — F3163 Bipolar disorder, current episode mixed, severe, without psychotic features: Secondary | ICD-10-CM | POA: Diagnosis present

## 2017-04-10 DIAGNOSIS — F319 Bipolar disorder, unspecified: Secondary | ICD-10-CM | POA: Diagnosis not present

## 2017-04-10 DIAGNOSIS — F312 Bipolar disorder, current episode manic severe with psychotic features: Secondary | ICD-10-CM | POA: Diagnosis not present

## 2017-04-10 DIAGNOSIS — F17200 Nicotine dependence, unspecified, uncomplicated: Secondary | ICD-10-CM | POA: Diagnosis present

## 2017-04-10 MED ORDER — GENERIC EXTERNAL MEDICATION
Status: DC
Start: ? — End: 2017-04-10

## 2017-04-10 MED ORDER — ALBUTEROL SULFATE HFA 108 (90 BASE) MCG/ACT IN AERS
INHALATION_SPRAY | RESPIRATORY_TRACT | Status: DC
Start: ? — End: 2017-04-10

## 2017-04-10 MED ORDER — QUETIAPINE FUMARATE 25 MG PO TABS
50.00 | ORAL_TABLET | ORAL | Status: DC
Start: 2017-04-10 — End: 2017-04-10

## 2017-04-10 MED ORDER — RISPERIDONE 1 MG PO TBDP
1.00 | ORAL_TABLET | ORAL | Status: DC
Start: ? — End: 2017-04-10

## 2017-04-10 MED ORDER — POLYETHYLENE GLYCOL 3350 17 G PO PACK
17.00 | PACK | ORAL | Status: DC
Start: ? — End: 2017-04-10

## 2017-04-10 MED ORDER — POTASSIUM CHLORIDE ER 10 MEQ PO TBCR
EXTENDED_RELEASE_TABLET | ORAL | Status: DC
Start: 2017-04-11 — End: 2017-04-10

## 2017-04-10 MED ORDER — INDACATEROL-GLYCOPYRROLATE 27.5-15.6 MCG IN CAPS
ORAL_CAPSULE | RESPIRATORY_TRACT | Status: DC
Start: 2017-04-10 — End: 2017-04-10

## 2017-04-10 MED ORDER — TRAZODONE HCL 50 MG PO TABS
50.00 | ORAL_TABLET | ORAL | Status: DC
Start: ? — End: 2017-04-10

## 2017-04-10 MED ORDER — ACETAMINOPHEN 325 MG PO TABS
650.00 | ORAL_TABLET | ORAL | Status: DC
Start: ? — End: 2017-04-10

## 2017-04-10 MED ORDER — TRIAMTERENE-HCTZ 37.5-25 MG PO TABS
ORAL_TABLET | ORAL | Status: DC
Start: 2017-04-11 — End: 2017-04-10

## 2017-04-10 MED ORDER — ANTACID & ANTIGAS 200-200-20 MG/5ML PO SUSP
30.00 | ORAL | Status: DC
Start: ? — End: 2017-04-10

## 2017-04-10 MED ORDER — ASPIRIN 325 MG PO TABS
325.00 | ORAL_TABLET | ORAL | Status: DC
Start: 2017-04-11 — End: 2017-04-10

## 2017-04-10 MED ORDER — DIVALPROEX SODIUM 500 MG PO DR TAB
500.00 | DELAYED_RELEASE_TABLET | ORAL | Status: DC
Start: 2017-04-10 — End: 2017-04-10

## 2017-04-10 MED ORDER — LORAZEPAM 0.5 MG PO TABS
0.50 | ORAL_TABLET | ORAL | Status: DC
Start: ? — End: 2017-04-10

## 2017-04-26 ENCOUNTER — Other Ambulatory Visit: Payer: Self-pay

## 2017-04-26 DIAGNOSIS — I700 Atherosclerosis of aorta: Secondary | ICD-10-CM

## 2017-04-26 NOTE — Progress Notes (Signed)
Patient came by wanting to have the ultrasound scheduled.   [facility] [place],  [place]  [address]  Main: [phone]   Appointment on May 1st Wednesday at 11:45 am  Patient advised - Do not eat, drink, smoke or eat candy the day of the ultrasound. They will have to reschedule the appointment if you drink, eat or smoke before the ultrasound.

## 2017-05-02 ENCOUNTER — Encounter: Payer: Self-pay | Admitting: Family Medicine

## 2017-05-02 ENCOUNTER — Ambulatory Visit (INDEPENDENT_AMBULATORY_CARE_PROVIDER_SITE_OTHER): Payer: Medicare Other | Admitting: Family Medicine

## 2017-05-02 ENCOUNTER — Ambulatory Visit (HOSPITAL_COMMUNITY)
Admission: RE | Admit: 2017-05-02 | Discharge: 2017-05-02 | Disposition: A | Discharge status: Home / Self Care | Payer: Medicare Other | Source: Ambulatory Visit | Attending: Vascular Surgery | Admitting: Vascular Surgery

## 2017-05-02 ENCOUNTER — Ambulatory Visit (INDEPENDENT_AMBULATORY_CARE_PROVIDER_SITE_OTHER): Payer: Medicare Other

## 2017-05-02 VITALS — BP 138/63 | HR 90 | Wt 137.0 lb

## 2017-05-02 DIAGNOSIS — M7061 Trochanteric bursitis, right hip: Secondary | ICD-10-CM

## 2017-05-02 DIAGNOSIS — M542 Cervicalgia: Secondary | ICD-10-CM | POA: Diagnosis not present

## 2017-05-02 DIAGNOSIS — M62838 Other muscle spasm: Secondary | ICD-10-CM | POA: Diagnosis not present

## 2017-05-02 DIAGNOSIS — M5031 Other cervical disc degeneration,  high cervical region: Secondary | ICD-10-CM

## 2017-05-02 DIAGNOSIS — I700 Atherosclerosis of aorta: Secondary | ICD-10-CM | POA: Diagnosis not present

## 2017-05-02 DIAGNOSIS — F3177 Bipolar disorder, in partial remission, most recent episode mixed: Secondary | ICD-10-CM | POA: Diagnosis not present

## 2017-05-02 DIAGNOSIS — M7071 Other bursitis of hip, right hip: Secondary | ICD-10-CM | POA: Insufficient documentation

## 2017-05-02 NOTE — Patient Instructions (Signed)
Thank you for coming in today. Get xray now.  Attend PT.  Recheck with me in 2 weeks.  Follow up with Dr Sheppard Coil ASAP.  That should be a 40 min visit.    Hip Bursitis Hip bursitis is inflammation of a fluid-filled sac (bursa) in the hip joint. The bursa protects the bones in the hip joint from rubbing against each other. Hip bursitis can cause mild to moderate pain, and symptoms often come and go over time. What are the causes? This condition may be caused by:  Injury to the hip.  Overuse of the muscles that surround the hip joint.  Arthritis or gout.  Diabetes.  Thyroid disease.  Cold weather.  Infection.  In some cases, the cause may not be known. What are the signs or symptoms? Symptoms of this condition may include:  Mild or moderate pain in the hip area. Pain may get worse with movement.  Tenderness and swelling of the hip, especially on the outer side of the hip.  Symptoms may come and go. If the bursa becomes infected, you may have the following symptoms:  Fever.  Red skin and a feeling of warmth in the hip area.  How is this diagnosed? This condition may be diagnosed based on:  A physical exam.  Your medical history.  X-rays.  Removal of fluid from your inflamed bursa for testing (biopsy).  You may be sent to a health care provider who specializes in bone diseases (orthopedist) or a provider who specializes in joint inflammation (rheumatologist). How is this treated? This condition is treated by resting, raising (elevating), and applying pressure(compression) to the injured area. In some cases, this may be enough to make your symptoms go away. Treatment may also include:  Crutches.  Antibiotic medicine.  Draining fluid out of the bursa to help relieve swelling.  Injecting medicine that helps to reduce inflammation (cortisone).  Follow these instructions at home: Medicines  Take over-the-counter and prescription medicines only as told by  your health care provider.  Do not drive or operate heavy machinery while taking prescription pain medicine, or as told by your health care provider.  If you were prescribed an antibiotic, take it as told by your health care provider. Do not stop taking the antibiotic even if you start to feel better. Activity  Return to your normal activities as told by your health care provider. Ask your health care provider what activities are safe for you.  Rest and protect your hip as much as possible until your pain and swelling get better. General instructions  Wear compression wraps only as told by your health care provider.  Elevate your hip above the level of your heart as much as you can without pain. To do this, try putting a pillow under your hips while you lie down.  Do not use your hip to support your body weight until your health care provider says that you can. Use crutches as told by your health care provider.  Gently massage and stretch your injured area as often as is comfortable.  Keep all follow-up visits as told by your health care provider. This is important. How is this prevented?  Exercise regularly, as told by your health care provider.  Warm up and stretch before being active.  Cool down and stretch after being active.  If an activity irritates your hip or causes pain, avoid the activity as much as possible.  Avoid sitting down for long periods at a time. Contact a health care  provider if:  You have a fever.  You develop new symptoms.  You have difficulty walking or doing everyday activities.  You have pain that gets worse or does not get better with medicine.  You develop red skin or a feeling of warmth in your hip area. Get help right away if:  You cannot move your hip.  You have severe pain. This information is not intended to replace advice given to you by your health care provider. Make sure you discuss any questions you have with your health care  provider. Document Released: 06/10/2001 Document Revised: 05/27/2015 Document Reviewed: 07/21/2014 Elsevier Interactive Patient Education  [employer].

## 2017-05-02 NOTE — Progress Notes (Signed)
Anita Krause is a 77 y.o. female who presents to [facility] today for right hip pain and left neck pain. Anita Krause was recently discharged after involuntary admission for a manic episode.  She was discharged on April 19 after a 10-day stay.  She was discharged on Depakote and Risperdal.  Patient is not sure about her medications today.  Regardless of her above recent medical history she is primarily presented to clinic today to address her pain complaints.  She has not yet scheduled follow-up appointment with her primary care provider.  Right hip pain: Ongoing worsening recently.  Patient notes pain in the right lateral hip radiating down to the lateral thigh.  She denies any injury or fall.  She notes pain is worse when she is lays on her right side.  She has tried some over-the-counter medications for pain which have helped a bit.  She denies any trials of physical therapy for this particular problem yet.  Left neck pain: Anita Krause notes pain in her left lateral neck.  Pain is worse with neck motion.  She denies any significant or new pain radiating down either arm or new weakness or numbness.  She notes a pertinent history for significant degenerative disc disease in her lumbar and cervical spine.  She again has not had much treatment for this aside from some over-the-counter medications recently.   Past Medical History:  Diagnosis Date  . Bipolar 1 disorder [facility])    Past Surgical History:  Procedure Laterality Date  . ABDOMINAL HYSTERECTOMY    . ANEURYSM COILING    . BACK SURGERY    . POLYPECTOMY     VOCAL CORD   Social History   Tobacco Use  . Smoking status: Former Smoker    Types: Cigarettes  . Smokeless tobacco: Never Used  . Tobacco comment: 03/07/17 - as per patient stopped months ago  Substance Use Topics  . Alcohol use: No    Frequency: Never     ROS:  As above   Medications: Current Outpatient Medications  Medication Sig  Dispense Refill  . albuterol (PROVENTIL HFA;VENTOLIN HFA) 108 (90 Base) MCG/ACT inhaler Inhale 1-2 puffs into the lungs every 4 (four) hours as needed for wheezing or shortness of breath. 2 Inhaler 11  . aspirin EC 81 MG tablet Take 1 tablet (81 mg total) by mouth daily. For prevention of heart attack/stroke 90 tablet 3  . benzonatate (TESSALON) 200 MG capsule Take 1 capsule (200 mg total) by mouth 3 (three) times daily as needed for cough. 30 capsule 6  . cyclobenzaprine (FLEXERIL) 10 MG tablet Take 1 tablet (10 mg total) by mouth 3 (three) times daily as needed for muscle spasms. 30 tablet 3  . desonide (DESOWEN) 0.05 % cream Apply topically 2 (two) times daily. As needed for psoriasis 30 g 3  . donepezil (ARICEPT) 10 MG tablet Take 1 tablet (10 mg total) by mouth at bedtime. For memory/dementia 90 tablet 3  . Fluticasone-Umeclidin-Vilant (TRELEGY ELLIPTA) 100-62.5-25 MCG/INH AEPB Inhale 1 Inhaler into the lungs daily. For chronic bronchitis / COPD 60 each 11  . folic acid (FOLVITE) 1 MG tablet Take 1 tablet (1 mg total) by mouth daily. Type of vitamin 90 tablet 3  . hydrOXYzine (ATARAX/VISTARIL) 25 MG tablet Take 25 mg by mouth 2 (two) times daily as needed for anxiety.    Marland Kitchen levothyroxine (SYNTHROID, LEVOTHROID) 50 MCG tablet Take 1 tablet (50 mcg total) by mouth daily before breakfast. For low  functioning thyroid 90 tablet 1  . naproxen (NAPROSYN) 500 MG tablet Take 1 tablet (500 mg total) by mouth 2 (two) times daily with a meal. For pain/arthritis 180 tablet 3  . ondansetron (ZOFRAN-ODT) 4 MG disintegrating tablet Take 1 tablet (4 mg total) by mouth every 8 (eight) hours as needed for nausea or vomiting. 20 tablet 6  . pantoprazole (PROTONIX) 40 MG tablet Take 1 tablet (40 mg total) by mouth daily. For acid reflux 90 tablet 3  . potassium chloride SA (K-DUR,KLOR-CON) 20 MEQ tablet Take 1 tablet (20 mEq total) by mouth 2 (two) times daily. For low potassium on your high blood pressure medicine 180  tablet 3  . predniSONE (STERAPRED UNI-PAK 21 TAB) 5 MG (21) TBPK tablet 6 day prednisone dosepack po. 21 tablet 0  . risperiDONE (RISPERDAL) 1 MG tablet Take by mouth.    . traZODone (DESYREL) 100 MG tablet TAKE 1 TABLET(100 MG) BY MOUTH AT BEDTIME 90 tablet 1  . triamterene-hydrochlorothiazide (DYAZIDE) 37.5-25 MG capsule Take 1 each (1 capsule total) by mouth 2 (two) times daily. For high blood pressure 180 capsule 1  . divalproex (DEPAKOTE) 250 MG DR tablet TK 3 TS PO HS  0  . hydrOXYzine (VISTARIL) 25 MG capsule   0   No current facility-administered medications for this visit.    Allergies  Allergen Reactions  . Erythromycin Anaphylaxis    Throat closes up, ling in mouth peals  . Penicillins Anaphylaxis  . Lamotrigine Rash  . Macrobid [Nitrofurantoin Macrocrystal]   . Welchol [Colesevelam Hcl]      Exam:  BP 138/63   Pulse 90   Wt 137 lb (62.1 kg)   BMI 24.66 kg/m  General: Well Developed, well nourished, and in no acute distress.  Neuro/Psych: Alert and oriented x3, extra-ocular muscles intact, able to move all 4 extremities, sensation grossly intact.  Patient very tangential on exam.  She is well-groomed appearing and is able to answer questions.  She does not appear to be responding to any hallucinations and does not express any delusions today. Skin: Warm and dry, no rashes noted.  Respiratory: Not using accessory muscles, speaking in full sentences, trachea midline.  Cardiovascular: Pulses palpable, no extremity edema. Abdomen: Does not appear distended. MSK:  C-spine: Nontender to spinal midline.  Tender to palpation left cervical paraspinal muscle group. Cervical motion is limited. Upper extremity strength is intact.  Right hip: Normal-appearing Normal hip motion. Tender to palpation greater trochanter and gluteus medius tendon insertion Hip abduction strength is limited 3+/5. Mild antalgic gait present.   X-ray cervical spine my personal independent  interpretation of the images Significant degenerative changes at C5-6 and C6-7 present.  Alignment is preserved.  No acute fractures present. Awaiting formal radiology review  EXAM: DG HIP (WITH OR WITHOUT PELVIS) 2-3V RIGHT  COMPARISON:  None.  FINDINGS: The included lumbar spine demonstrates degenerative disc disease with disc space flattening, vacuum disc phenomenon and endplate sclerosis at W0-9 and L5-S1. Facet arthropathy is noted at the lumbosacral junction. The arcuate lines the sacrum appear intact. Mild degenerative spurring about the SI joints bilaterally. The bony pelvis appears intact. There is sclerosis about the pubic symphysis with joint space narrowing and degenerative subcortical cystic change of the left parasymphysis. The femoral heads are maintained within both hip joints with right slightly greater than left joint space narrowing of the right hip relative to left. The pubic rami appear intact. The proximal femora appear intact. Acetabular components are not shallow nor  is there overcoverage of the femoral heads. No suspicious osseous lesions. No suspicious soft tissue abnormality.  IMPRESSION: 1. Lower lumbar degenerative disc disease and facet arthropathy from L4 through S1. 2. 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 3. Mild degenerative joint space narrowing of the right hip. No acute osseous abnormality.   Electronically Signed   By: Ashley Royalty M.D.   On: 04/06/2017 18:04 I personally (independently) visualized and performed the interpretation of the images attached in this note.     Assessment and Plan: 77 y.o. female with  Right lateral hip pain is trochanteric bursitis versus hip abductor tendinopathy.  Plan for physical therapy trial.  Would like to avoid steroids if possible as this may exacerbate mania.  Check in 2 weeks.  Cervical spine pain: Pain is located at the paraspinal  muscles and very likely due to myofascial disruption dysfunction and spasm.  Patient also has significant degenerative changes noted.  Again plan for physical therapy trial and recheck in 2 weeks.  Psych: Patient recently discharged after a 10-day voluntary commitment stay in psychiatric hospital for mania.  I attempted a medication reconciliation today however patient did not bring her medications with her and is not sure what she is actually taking.  She certainly has many more medical and psychiatric problems to address today than her neck and hip pain.  I recommended strongly following up with primary care provider in the near future.    Orders Placed This Encounter  Procedures  . DG Cervical Spine Complete    Standing Status:   Future    Number of Occurrences:   1    Standing Expiration Date:   07/03/2018    Order Specific Question:   Reason for Exam (SYMPTOM  OR DIAGNOSIS REQUIRED)    Answer:   eval ddd    Order Specific Question:   Preferred imaging location?    Answer:   [facility]    Order Specific Question:   Radiology Contrast Protocol - do NOT remove file path    Answer:   [email or web address]  . Ambulatory referral to Physical Therapy    Referral Priority:   Routine    Referral Type:   Physical Medicine    Referral Reason:   Specialty Services Required    Requested Specialty:   Physical Therapy   No orders of the defined types were placed in this encounter.   Discussed warning signs or symptoms. Please see discharge instructions. Patient expresses understanding.

## 2017-05-03 ENCOUNTER — Encounter: Payer: Self-pay | Admitting: Family Medicine

## 2017-05-03 DIAGNOSIS — I714 Abdominal aortic aneurysm, without rupture, unspecified: Secondary | ICD-10-CM | POA: Insufficient documentation

## 2017-05-03 HISTORY — DX: Abdominal aortic aneurysm, without rupture, unspecified: I71.40

## 2017-05-03 HISTORY — DX: Abdominal aortic aneurysm, without rupture: I71.4

## 2017-05-10 ENCOUNTER — Ambulatory Visit (INDEPENDENT_AMBULATORY_CARE_PROVIDER_SITE_OTHER): Payer: Medicare Other | Admitting: Physical Therapy

## 2017-05-10 DIAGNOSIS — M62838 Other muscle spasm: Secondary | ICD-10-CM

## 2017-05-10 DIAGNOSIS — M25551 Pain in right hip: Secondary | ICD-10-CM

## 2017-05-10 DIAGNOSIS — M6281 Muscle weakness (generalized): Secondary | ICD-10-CM | POA: Diagnosis not present

## 2017-05-10 DIAGNOSIS — R293 Abnormal posture: Secondary | ICD-10-CM

## 2017-05-10 DIAGNOSIS — M542 Cervicalgia: Secondary | ICD-10-CM

## 2017-05-10 NOTE — Patient Instructions (Signed)
Shoulder Press   Press both shoulders down. Hold _3__ seconds. Repeat _10__ times. Press one shoulder down. Hold _3__ seconds Repeat _10__ times. Do other shoulder. If unable to press one or both shoulders, lie in position a few sessions until you can. Surface: on bed with small pillow under head , once a day.   Head Press With [place] chin SLIGHTLY toward chest, keep mouth closed. Feel weight on back of head. Increase weight by pressing head down. Hold _3__ seconds. Relax. Repeat __10_ times. Surface: on bed with small pillow under head.  Once  A day  Leg Lengthener: Full   Straighten one leg. Pull toes AND forefoot toward knee, extend heel. Lengthen leg by pulling pelvis away from ribs. Hold _3__ seconds. Relax. Repeat 10 time. Re-bend knee. Do other leg. Each leg _10__ times. Surface: on bed with small pillow under head. Once a day.    Leg Press: Single   Straighten one leg down to floor. Bring toes AND forefoot toward knee, extend heel. Press leg down. DO NOT BEND KNEE. Hold _3__ seconds. Relax leg. Repeat exercise 10 time. Relax leg. Re-bend knee. Repeat with other leg. Each leg _10__ times. Surface on bed, small pillow under head, once a day.  Jule Ser [facility] [phone]

## 2017-05-10 NOTE — Therapy (Signed)
[facility] [address] [place], [place], [address] Phone: [phone]   Fax:  [phone]  Physical Therapy Evaluation  Patient Details  Name: Anita Krause MRN: [id number] Date of Birth: 10-31-1940 Referring Provider: Dr Lynne Leader   Encounter Date: 05/10/2017  PT End of Session - 05/10/17 1104    Visit Number  1    Number of Visits  12    Date for PT Re-Evaluation  06/21/17    PT Start Time  1104    PT Stop Time  1202    PT Time Calculation (min)  58 min       Past Medical History:  Diagnosis Date  . Bipolar 1 disorder [facility])     Past Surgical History:  Procedure Laterality Date  . ABDOMINAL HYSTERECTOMY    . ANEURYSM COILING    . BACK SURGERY    . POLYPECTOMY     VOCAL CORD    There were no vitals filed for this visit.   Subjective Assessment - 05/10/17 1104    Subjective  Pt reports she developed Rt hip and neck pain this past winter.  She had an injection in her hip and this helped some however her pain has continued and now getting worse.  Her neck pain has "worked in with the other" and moves around the shoulder, currenlty more Rt now however it was left before.     Currently in Pain?  Yes    Pain Score  8     Pain Location  Hip    Pain Orientation  Right    Pain Descriptors / Indicators  -- just hurts    Pain Type  Chronic pain    Pain Onset  More than a month ago    Pain Frequency  Constant    Aggravating Factors   walking, transfering sit to stand    Pain Relieving Factors  medication,     Multiple Pain Sites  Yes    Pain Score  8    Pain Location  Neck    Pain Orientation  Right    Pain Descriptors / Indicators  Sharp    Pain Type  Chronic pain    Pain Onset  More than a month ago    Pain Frequency  Constant    Aggravating Factors   leaning forward    Pain Relieving Factors  heat and medication         [facility] PT Assessment - 05/10/17 0001      Assessment   Medical Diagnosis   cervical pain and Rt hip bursisits    Referring Provider  Dr Lynne Leader    Onset Date/Surgical Date  05/10/16    Hand Dominance  Right    Next MD Visit  scheduling after PT    Prior Therapy  for low back      Precautions   Precautions  None      Balance Screen   Has the patient fallen in the past 6 months  No    Has the patient had a decrease in activity level because of a fear of falling?   No    Is the patient reluctant to leave their home because of a fear of falling?   No      Home Environment   Living Environment  Private residence    Living Arrangements  Alone    Additional Comments  sister is in town visiting for anout a month  Prior Function   Level of Independence  Independent    Vocation  Retired    Production designer, theatre/television/film   Overall Cognitive Status  History of cognitive impairments - at baseline    Attention  Divided    Divided Attention  Impaired    Behaviors  Restless      Observation/Other Assessments   Focus on Therapeutic Outcomes (FOTO)   60% limited       Functional Tests   Functional tests  --      Posture/Postural Control   Posture/Postural Control  Postural limitations    Postural Limitations  Flexed trunk;Rounded Shoulders;Forward head;Decreased lumbar lordosis upper body shift to Rt       ROM / Strength   AROM / PROM / Strength  AROM;Strength      AROM   AROM Assessment Site  Shoulder;Hip;Cervical;Lumbar    Right/Left Shoulder  -- WNL    Right/Left Hip  Left;Right    Right Hip Extension  1    Right Hip External Rotation   40    Right Hip Internal Rotation   12    Left Hip Extension  -8    Left Hip External Rotation   45    Left Hip Internal Rotation   25    Cervical Flexion  WNl with pulling    Cervical Extension  15 with tightness    Cervical - Right Side Bend  30    Cervical - Left Side Bend  37    Cervical - Right Rotation  65    Cervical - Left Rotation  65    Lumbar Flexion  75 % present     Lumbar Extension   10% present    Lumbar - Right Rotation  25% present    Lumbar - Left Rotation  75% present      Strength   Strength Assessment Site  Shoulder;Elbow;Hip;Knee;Ankle    Right/Left Shoulder  -- WNL except Rt ER 4/5    Right/Left Elbow  -- WNL    Right/Left Hip  Left;Right    Right Hip Extension  4-/5    Right Hip ABduction  4+/5    Left Hip Extension  4+/5    Left Hip ABduction  4/5    Right/Left Knee  -- WNL    Right/Left Ankle  -- WNL      Flexibility   Soft Tissue Assessment /Muscle Length  yes    Hamstrings  -- WNL    Quadriceps  prone knee flex Rt ~ 110 degrees, Lt 125 degrees    Piriformis  tight bilat      Palpation   Spinal mobility  hypomobile through out.     Palpation comment  very tight in bilat cervical paraspinals, trigger point in Rt gluts, tightness in lumbar paraspinals                 Objective measurements completed on examination: See above findings.      [place] Adult PT Treatment/Exercise - 05/10/17 0001      Exercises   Exercises  Other Exercises    Other Exercises   instructed in decompression  routine, requied a lot of VC to stay on task and for form.       Modalities   Modalities  Moist Heat she does not like e-stim      Moist Heat Therapy   Number Minutes Moist Heat  15 Minutes    Moist  Heat Location  Cervical;Lumbar Spine;Knee thoracic, Rt knee             PT Education - 05/10/17 1139    Education provided  Yes    Education Details  HEP    Person(s) Educated  Patient    Methods  Explanation;Demonstration;Handout    Comprehension  Verbal cues required;Returned demonstration;Verbalized understanding;Need further instruction          PT Long Term Goals - 05/10/17 1246      PT LONG TERM GOAL #1   Title  I with HEP for neck, posture, hip ( 06/21/17)    Time  6    Period  Weeks    Status  New      PT LONG TERM GOAL #2   Title  demo cervical ROM with minimal to no pain ( 06/21/17)     Time  6    Period  Weeks    Status   New      PT LONG TERM GOAL #3   Title  improve lumbar ROM to [facility] with minimal to no pain ( 06/21/17)     Time  6    Period  Weeks    Status  New      PT LONG TERM GOAL #4   Title  improve bilat hip rotation to WNL ( 06/21/17)     Time  6    Period  Weeks    Status  New      PT LONG TERM GOAL #5   Title  improve bilat hip strength =/> 5-/5 ( 06/21/17)     Time  6    Period  Weeks    Status  New      PT LONG TERM GOAL #6   Title  improve FOTO =/< 53% limited  ( 06/21/17)     Time  6    Period  Weeks    Status  New             Plan - 05/10/17 1414    Clinical Impression Statement  77 yo female presents with reports of low back pain, Rt hip and lateral leg pain, neck, bilat shoulder and Rt arm pain.  She has some limitations in ROM, generalized weakness, hypomobility in her spine with very tight muscles around the occiput and in bilat paraspinals.  There are also some trigger points in her gluts/piriformis. She also has postural changes placing stress on her muscles/spine.   Pt had difficulty staying on task and required frequent redirection during assessment and instruction in HEP.  Compliance and understanding of her HEP and deficits are questionable.  She would benifit from PT to decrease muscle tightness, spinal hypomobility and improve strength and motion.     History and Personal Factors relevant to plan of care:  bipolar, h/o lumbar surgery, anxiety, abdominal anyerism.     Clinical Presentation  Evolving    Clinical Decision Making  Moderate    PT Frequency  2x / week    PT Duration  12 weeks    PT Treatment/Interventions  Dry needling;Iontophoresis 4mg /ml Dexamethasone;Moist Heat;Manual techniques;Taping;Patient/family education;Therapeutic activities;Ultrasound;Cryotherapy;Electrical Stimulation;Therapeutic exercise;Vasopneumatic Device    PT Next Visit Plan  manual work to Rt gluts/piriformis , pericervical and occipital muscles.  Progress core /cervical stability     Consulted and Agree with Plan of Care  Patient       Patient will benefit from skilled therapeutic intervention in order to improve the following deficits and impairments:  Pain,  Postural dysfunction, Increased muscle spasms, Decreased range of motion, Decreased strength, Hypomobility  Visit Diagnosis: Cervicalgia - Plan: PT plan of care cert/re-cert  Pain in right hip - Plan: PT plan of care cert/re-cert  Muscle weakness (generalized) - Plan: PT plan of care cert/re-cert  Abnormal posture - Plan: PT plan of care cert/re-cert  Other muscle spasm - Plan: PT plan of care cert/re-cert     Problem List Patient Active Problem List   Diagnosis Date Noted  . AAA (abdominal aortic aneurysm) without rupture ([place]) 05/03/2017  . Bursitis of hip, right 05/02/2017  . Right shoulder pain 03/14/2017  . Chronic back pain 03/14/2017  . 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 Abdominal aortic atherosclerosis ([place]) 03/08/2017  . Bipolar 1 disorder, mixed, partial remission ([place]) 09/21/2016  . 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 History of hypertension 09/21/2016  . Tobacco abuse disorder 09/21/2016  . Chronic bronchitis ([place]) 09/21/2016  . Functional memory problem 09/21/2016  . Muscle spasm 09/21/2016  . Anxiety 09/21/2016  . Adjustment disorder with disturbance of emotion 08/29/2016    Jeral Pinch PT  05/10/2017, 2:28 PM  [facility] [place], [place], [address] Phone: [phone]   Fax:  [phone]  Name: Anita Krause MRN: [id number] Date of Birth: [birth year]

## 2017-05-14 ENCOUNTER — Encounter: Payer: Self-pay | Admitting: Physical Therapy

## 2017-05-16 ENCOUNTER — Ambulatory Visit (INDEPENDENT_AMBULATORY_CARE_PROVIDER_SITE_OTHER): Payer: Medicare Other | Admitting: Family Medicine

## 2017-05-16 ENCOUNTER — Ambulatory Visit (INDEPENDENT_AMBULATORY_CARE_PROVIDER_SITE_OTHER): Payer: Medicare Other | Admitting: Osteopathic Medicine

## 2017-05-16 ENCOUNTER — Encounter: Payer: Self-pay | Admitting: Family Medicine

## 2017-05-16 ENCOUNTER — Encounter: Payer: Self-pay | Admitting: Osteopathic Medicine

## 2017-05-16 VITALS — BP 116/60 | HR 74 | Temp 97.5°F | Wt 135.5 lb

## 2017-05-16 VITALS — BP 116/60 | HR 74 | Wt 135.0 lb

## 2017-05-16 DIAGNOSIS — Z23 Encounter for immunization: Secondary | ICD-10-CM

## 2017-05-16 DIAGNOSIS — H6123 Impacted cerumen, bilateral: Secondary | ICD-10-CM | POA: Diagnosis not present

## 2017-05-16 DIAGNOSIS — F3177 Bipolar disorder, in partial remission, most recent episode mixed: Secondary | ICD-10-CM | POA: Diagnosis not present

## 2017-05-16 DIAGNOSIS — M62838 Other muscle spasm: Secondary | ICD-10-CM

## 2017-05-16 DIAGNOSIS — F99 Mental disorder, not otherwise specified: Secondary | ICD-10-CM | POA: Diagnosis not present

## 2017-05-16 DIAGNOSIS — F5105 Insomnia due to other mental disorder: Secondary | ICD-10-CM | POA: Diagnosis not present

## 2017-05-16 DIAGNOSIS — J449 Chronic obstructive pulmonary disease, unspecified: Secondary | ICD-10-CM

## 2017-05-16 DIAGNOSIS — S0083XA Contusion of other part of head, initial encounter: Secondary | ICD-10-CM

## 2017-05-16 DIAGNOSIS — M25511 Pain in right shoulder: Secondary | ICD-10-CM | POA: Diagnosis not present

## 2017-05-16 DIAGNOSIS — M7061 Trochanteric bursitis, right hip: Secondary | ICD-10-CM | POA: Diagnosis not present

## 2017-05-16 DIAGNOSIS — G8929 Other chronic pain: Secondary | ICD-10-CM | POA: Diagnosis not present

## 2017-05-16 MED ORDER — CYCLOBENZAPRINE HCL 5 MG PO TABS: 5.0000 mg | ORAL_TABLET | Freq: Three times a day (TID) | ORAL | 2 refills | Status: DC | PRN

## 2017-05-16 NOTE — Patient Instructions (Signed)
Thank you for coming in today.  Call or go to the ER if you develop a large red swollen joint with extreme pain or oozing puss.  Continue PT.   Recheck with me in 2 weeks.  Return sooner if needed.   Trochanteric Bursitis Trochanteric bursitis is a condition that causes hip pain. Trochanteric bursitis happens when fluid-filled sacs (bursae) in the hip get irritated. Normally these sacs absorb shock and help strong bands of tissue (tendons) in your hip glide smoothly over each other and over your hip bones. What are the causes? This condition results from increased friction between the hip bones and the tendons that go over them. This condition can happen if you:  Have weak hips.  Use your hip muscles too much (overuse).  Get hit in the hip.  What increases the risk? This condition is more likely to develop in:  Women.  Adults who are middle-aged or older.  People with arthritis or a spinal condition.  People with weak buttocks muscles (gluteal muscles).  People who have one leg that is shorter than the other.  People who participate in certain kinds of athletic activities, such as: ? Running sports, especially long-distance running. ? Contact sports, like football or martial arts. ? Sports in which falls may occur, like skiing.  What are the signs or symptoms? The main symptom of this condition is pain and tenderness over the point of your hip. The pain may be:  Sharp and intense.  Dull and achy.  Felt on the outside of your thigh.  It may increase when you:  Lie on your side.  Walk or run.  Go up on stairs.  Sit.  Stand up after sitting.  Stand for long periods of time.  How is this diagnosed? This condition may be diagnosed based on:  Your symptoms.  Your medical history.  A physical exam.  Imaging tests, such as: ? X-rays to check your bones. ? An MRI or ultrasound to check your tendons and muscles.  During your physical exam, your health  care provider will check the movement and strength of your hip. He or she may press on the point of your hip to check for pain. How is this treated? This condition may be treated by:  Resting.  Reducing your activity.  Avoiding activities that cause pain.  Using crutches, a cane, or a walker to decrease the strain on your hip.  Taking medicine to help with swelling.  Having medicine injected into the bursae to help with swelling.  Using ice, heat, and massage therapy for pain relief.  Physical therapy exercises for strength and flexibility.  Surgery (rare).  Follow these instructions at home: Activity  Rest.  Avoid activities that cause pain.  Return to your normal activities as told by your health care provider. Ask your health care provider what activities are safe for you. Managing pain, stiffness, and swelling  Take over-the-counter and prescription medicines only as told by your health care provider.  If directed, apply heat to the injured area as told by your health care provider. ? Place a towel between your skin and the heat source. ? Leave the heat on for 20-30 minutes. ? Remove the heat if your skin turns bright red. This is especially important if you are unable to feel pain, heat, or cold. You may have a greater risk of getting burned.  If directed, apply ice to the injured area: ? Put ice in a plastic bag. ? Place a  towel between your skin and the bag. ? Leave the ice on for 20 minutes, 2-3 times a day. General instructions  If the affected leg is one that you use for driving, ask your health care provider when it is safe to drive.  Use crutches, a cane, or a walker as told by your health care provider.  If one of your legs is shorter than the other, get fitted for a shoe insert.  Lose weight if you are overweight. How is this prevented?  Wear supportive footwear that is appropriate for your sport.  If you have hip pain, start any new exercise or  sport slowly.  Maintain physical fitness, including: ? Strength. ? Flexibility. Contact a health care provider if:  Your pain does not improve with 2-4 weeks. Get help right away if:  You develop severe pain.  You have a fever.  You develop increased redness over your hip.  You have a change in your bowel function or bladder function.  You cannot control the muscles in your feet. This information is not intended to replace advice given to you by your health care provider. Make sure you discuss any questions you have with your health care provider. Document Released: 01/27/2004 Document Revised: 08/25/2015 Document Reviewed: 12/04/2014 Elsevier Interactive Patient Education  [employer].

## 2017-05-16 NOTE — Patient Instructions (Addendum)
Plan:  Continue taking your medications as you are taking them, especially the ones highlighted on your list.   Please call your pharmacy or Dr Corky Sox office today to obtain refills and make sure you have a follow-up appointment with Dr Jessy Oto.   I can't see some of the images or reports on the discs, we will request records from [place] to come see me as needed. Let's check how you're doing in another month or so, or sooner if something comes up requiring immediate attention.

## 2017-05-16 NOTE — Progress Notes (Signed)
Anita Krause is a 77 y.o. female who presents to [facility] today for fall, right shoulder pain, right lateral hip pain, and head contusion.  Anita Krause is scheduled today for follow-up of her right lateral hip pain thought to be trochanteric bursitis.  She has had her first episode of physical therapy and notes improving symptoms.  She notes also her neck pain addressed last visit is a lot better now.  She does note continued lateral hip pain interfering with her quality of life especially bothersome when she stands up from a seated position and lays on her right side.  She is interested in a steroid injection if possible.  Additionally she notes that she tripped and fell this morning.  She managed to catch herself with her outstretched right hand.  She denies any wrist or elbow pain but notes a little mild shoulder pain.  She is had ongoing shoulder pain in the past as well.  She thinks the pain is about the same.  She notes pain is located in the lateral upper arm and is worse with arm motion.  She denies any weakness or radiating pain.  Additionally she struck the forehead on the ground and notes a small "goose egg" under the skin. She denies any dizziness loss of function lightheadedness or headache.  She feels pretty well and would like to avoid CT scan if possible.  Past Medical History:  Diagnosis Date  . Bipolar 1 disorder [facility])    Past Surgical History:  Procedure Laterality Date  . ABDOMINAL HYSTERECTOMY    . ANEURYSM COILING    . BACK SURGERY    . POLYPECTOMY     VOCAL CORD   Social History   Tobacco Use  . Smoking status: Current Some Day Smoker    Types: Cigarettes  . Smokeless tobacco: Never Used  . Tobacco comment: 05/16/17 As per pt, smokes one cig occassionally  Substance Use Topics  . Alcohol use: No    Frequency: Never     ROS:  As above   Medications: Current Outpatient Medications  Medication Sig Dispense  Refill  . albuterol (PROVENTIL HFA;VENTOLIN HFA) 108 (90 Base) MCG/ACT inhaler Inhale 1-2 puffs into the lungs every 4 (four) hours as needed for wheezing or shortness of breath. 2 Inhaler 11  . aspirin EC 81 MG tablet Take 1 tablet (81 mg total) by mouth daily. For prevention of heart attack/stroke 90 tablet 3  . cyclobenzaprine (FLEXERIL) 5 MG tablet Take 1 tablet (5 mg total) by mouth 3 (three) times daily as needed for muscle spasms. 30 tablet 2  . divalproex (DEPAKOTE) 250 MG DR tablet TK 3 TS PO HS  0  . Fluticasone-Umeclidin-Vilant (TRELEGY ELLIPTA) 100-62.5-25 MCG/INH AEPB Inhale 1 Inhaler into the lungs daily. For chronic bronchitis / COPD 60 each 11  . hydrOXYzine (ATARAX/VISTARIL) 25 MG tablet Take 25 mg by mouth 2 (two) times daily as needed for anxiety.    Marland Kitchen levothyroxine (SYNTHROID, LEVOTHROID) 50 MCG tablet Take 1 tablet (50 mcg total) by mouth daily before breakfast. For low functioning thyroid 90 tablet 1  . naproxen (NAPROSYN) 500 MG tablet Take 1 tablet (500 mg total) by mouth 2 (two) times daily with a meal. For pain/arthritis 180 tablet 3  . potassium chloride SA (K-DUR,KLOR-CON) 20 MEQ tablet Take 1 tablet (20 mEq total) by mouth 2 (two) times daily. For low potassium on your high blood pressure medicine 180 tablet 3  . risperiDONE (RISPERDAL) 1  MG tablet Take by mouth.    . traZODone (DESYREL) 100 MG tablet TAKE 1 TABLET(100 MG) BY MOUTH AT BEDTIME 90 tablet 1  . triamterene-hydrochlorothiazide (DYAZIDE) 37.5-25 MG capsule Take 1 each (1 capsule total) by mouth 2 (two) times daily. For high blood pressure 180 capsule 1   No current facility-administered medications for this visit.    Allergies  Allergen Reactions  . Erythromycin Anaphylaxis    Throat closes up, ling in mouth peals  . Nitrofurantoin Macrocrystal Anaphylaxis    Pt states her throat closes up  . Penicillins Anaphylaxis  . Colesevelam Hcl Other (See Comments)  . Lamotrigine Rash     Exam:  BP 116/60    Pulse 74   Wt 135 lb (61.2 kg)   BMI 24.30 kg/m  General: Well Developed, well nourished, and in no acute distress.   Skin: Warm and dry, no rashes noted. Small hematoma and abrasion right forehead.  Respiratory: Not using accessory muscles, speaking in full sentences, trachea midline.  Cardiovascular: Pulses palpable, no extremity edema. Abdomen: Does not appear distended. MSK:  C-spine nontender to midline.  Decreased range of motion of the cervical spine Right shoulder: Normal-appearing not particularly tender. Normal abduction and internal rotation to the lumbar spine.  Normal external rotation. Strength intact 5/5 to abduction external/internal rotation. Positive Hawkins and Neer's test. Positive empty can test. Pulses capillary fill and sensation intact distally. Right elbow and wrist are nontender with normal motion.  Right lateral hip: Normal-appearing.  Tender palpation greater trochanter. Normal hip motion.  Neuro/Psych: Alert and oriented x3, extra-ocular muscles intact, able to move all 4 extremities, sensation grossly intact. Normal coordination, balance.     Hip greater trochanteric injection: Right Consent obtained and timeout performed. Area of maximum tenderness palpated and identified. Skin cleaned with alcohol, cold spray applied. A 25 gauge needle was used to access the greater trochanteric bursa. 40 mg of Depo-Medrol, and 4 mL of Marcaine were used to inject the trochanteric bursa. Patient tolerated the procedure well.     Assessment and Plan: 77 y.o. female with  Right lateral hip pain: Trochanteric bursitis.  Continue physical therapy.  Injection as above today.  Recheck in 2 weeks.  Shoulder pain: RTC tendonitis or impingement. Doubtful for tear as strength is intact. Continue PT. Recheck in 2 weeks. Consider injection as needed at that time.   Head contusion: Small. Clinically doing well. [place] declined CT scan head. Plan for watchful waiting.    PNA 23 given prior to discharge.   Orders Placed This Encounter  Procedures  . Pneumococcal polysaccharide vaccine 23-valent greater than or equal to 2yo subcutaneous/IM   No orders of the defined types were placed in this encounter.   Discussed warning signs or symptoms. Please see discharge instructions. Patient expresses understanding.

## 2017-05-16 NOTE — Progress Notes (Signed)
HPI: Anita Krause is a 77 y.o. female who  has a past medical history of Bipolar 1 disorder ([place]).  she presents to [facility] today, 05/16/17,  for chief complaint of:  Hospital follow-up psychiatric   . Hx poorly controlled Bipolar disorder, recent hospitalization. Since then, she has moved out of her condo, she states her daughter kept calling the law on her, so her daughter could stay in the condo by herself and drive her mom's car. Patient sees no problem with her mental health.  . At this point, she is taking medicines. I went over everything and it lines up with hospital discharge for the most part. Importantly, she is taking the Depakote 750 mg daily, Risperdal 1 mg daily. She is not taking the Trazodone, she states it keeps her awake. She is taking her Dyazide. She has pill bottles for potassium supplements, Naproxen, Hydroxyzine. She states they hydroxyzine is more helpful for sleep so she's been using this instead of Trazodone.  . She states she saw Dr Jessy Oto recently since she has been out of the hospital but I don't see any documentation of an office visit in [place].  . Otherwise, complains of persistent cough and ear wax discomfort. She is seeing sports med for hip pain issues.    Past medical history, surgical history, and family history reviewed.  Current medication list and allergy/intolerance information reviewed.   (See remainder of HPI, ROS, Phys Exam below)    ASSESSMENT/PLAN: The primary encounter diagnosis was Bipolar 1 disorder, mixed, partial remission ([place]). Diagnoses of Excessive cerumen in both ear canals, Chronic obstructive pulmonary disease, unspecified COPD type ([place]), and Insomnia due to other mental disorder were also pertinent to this visit.   Meds reconciled to the best of my ability. I think she is taking her most important psychiatric medications and BP meds.   Advised quit smoking.   Will call Dr Corky Sox  office to see if she was there as she states she was, or if something in computer isn't letting me see her psych notes from another healthcare system.     Patient Instructions  Plan:  Continue taking your medications as you are taking them, especially the ones highlighted on your list.   Please call your pharmacy or Dr Corky Sox office today to obtain refills and make sure you have a follow-up appointment with Dr Jessy Oto.   I can't see some of the images or reports on the discs, we will request records from [place] to come see me as needed. Let's check how you're doing in another month or so, or sooner if something comes up requiring immediate attention.    Follow-up plan: Return in about 1 month (around 06/13/2017) for recheck on medications, recheck ears/cough if persistent problems. See me sooner if needed .     ############################################ ############################################ ############################################ ############################################    Outpatient Encounter Medications as of 05/16/2017  Medication Sig  . albuterol (PROVENTIL HFA;VENTOLIN HFA) 108 (90 Base) MCG/ACT inhaler Inhale 1-2 puffs into the lungs every 4 (four) hours as needed for wheezing or shortness of breath.  Marland Kitchen aspirin EC 81 MG tablet Take 1 tablet (81 mg total) by mouth daily. For prevention of heart attack/stroke  . divalproex (DEPAKOTE) 250 MG DR tablet TK 3 TS PO HS  . Fluticasone-Umeclidin-Vilant (TRELEGY ELLIPTA) 100-62.5-25 MCG/INH AEPB Inhale 1 Inhaler into the lungs daily. For chronic bronchitis / COPD  . hydrOXYzine (ATARAX/VISTARIL) 25 MG tablet Take 25 mg  by mouth 2 (two) times daily as needed for anxiety.  Marland Kitchen levothyroxine (SYNTHROID, LEVOTHROID) 50 MCG tablet Take 1 tablet (50 mcg total) by mouth daily before breakfast. For low functioning thyroid  . naproxen (NAPROSYN) 500 MG tablet Take 1 tablet (500 mg total) by mouth 2 (two) times daily  with a meal. For pain/arthritis  . potassium chloride SA (K-DUR,KLOR-CON) 20 MEQ tablet Take 1 tablet (20 mEq total) by mouth 2 (two) times daily. For low potassium on your high blood pressure medicine  . risperiDONE (RISPERDAL) 1 MG tablet Take by mouth.  . triamterene-hydrochlorothiazide (DYAZIDE) 37.5-25 MG capsule Take 1 each (1 capsule total) by mouth 2 (two) times daily. For high blood pressure  . [DISCONTINUED] hydrOXYzine (VISTARIL) 25 MG capsule   . benzonatate (TESSALON) 200 MG capsule Take 1 capsule (200 mg total) by mouth 3 (three) times daily as needed for cough. (Patient not taking: Reported on 05/16/2017)  . cyclobenzaprine (FLEXERIL) 10 MG tablet Take 1 tablet (10 mg total) by mouth 3 (three) times daily as needed for muscle spasms. (Patient not taking: Reported on 05/16/2017)  . desonide (DESOWEN) 0.05 % cream Apply topically 2 (two) times daily. As needed for psoriasis (Patient not taking: Reported on 05/16/2017)  . donepezil (ARICEPT) 10 MG tablet Take 1 tablet (10 mg total) by mouth at bedtime. For memory/dementia (Patient not taking: Reported on 05/16/2017)  . folic acid (FOLVITE) 1 MG tablet Take 1 tablet (1 mg total) by mouth daily. Type of vitamin (Patient not taking: Reported on 05/16/2017)  . ondansetron (ZOFRAN-ODT) 4 MG disintegrating tablet Take 1 tablet (4 mg total) by mouth every 8 (eight) hours as needed for nausea or vomiting. (Patient not taking: Reported on 05/16/2017)  . pantoprazole (PROTONIX) 40 MG tablet Take 1 tablet (40 mg total) by mouth daily. For acid reflux (Patient not taking: Reported on 05/16/2017)  . predniSONE (STERAPRED UNI-PAK 21 TAB) 5 MG (21) TBPK tablet 6 day prednisone dosepack po. (Patient not taking: Reported on 05/16/2017)  . traZODone (DESYREL) 100 MG tablet TAKE 1 TABLET(100 MG) BY MOUTH AT BEDTIME (Patient not taking: Reported on 05/16/2017)   No facility-administered encounter medications on file as of 05/16/2017.    Allergies  Allergen Reactions   . Erythromycin Anaphylaxis    Throat closes up, ling in mouth peals  . Nitrofurantoin Macrocrystal Anaphylaxis    Pt states her throat closes up  . Penicillins Anaphylaxis  . Colesevelam Hcl Other (See Comments)  . Lamotrigine Rash      Review of Systems:  Constitutional: No recent illness  HEENT: No  headache, no vision change  Cardiac: No  chest pain, No  pressure, No palpitations  Respiratory:  No  shortness of breath. +Cough  Gastrointestinal: No  abdominal pain, no change on bowel habits  Musculoskeletal: No new myalgia/arthralgia  Skin: No  Rash  Hem/Onc: No  easy bruising/bleeding, No  abnormal lumps/bumps  Neurologic: No  weakness, No  Dizziness  Psychiatric: No  concerns with depression, No  concerns with anxiety  Exam:  BP 116/60 (BP Location: Left Arm, Patient Position: Sitting, Cuff Size: Normal)   Pulse 74   Temp (!) 97.5 F (36.4 C) (Oral)   Wt 135 lb 8 oz (61.5 kg)   BMI 24.39 kg/m   Constitutional: VS see above. General Appearance: alert, well-developed, well-nourished, NAD  Eyes: Normal lids and conjunctive, non-icteric sclera  Ears, Nose, Mouth, Throat: MMM, Normal external inspection ears/nares/mouth/lips/gums.  Neck: No masses, trachea midline.   Respiratory: Normal  respiratory effort. no wheeze, no rhonchi, no rales. Faint breath sounds all fields.   Cardiovascular: S1/S2 normal, +murmur, no rub/gallop auscultated. RRR.   Musculoskeletal: Gait normal. Symmetric and independent movement of all extremities  Neurological: Normal balance/coordination. No tremor.  Skin: warm, dry, intact.   Psychiatric: Normal judgment/insight. Normal mood and affect. Oriented x3.   Visit summary with medication list and pertinent instructions was printed for patient to review, advised to alert [place] if any changes needed. All questions at time of visit were answered - patient instructed to contact office with any additional concerns. ER/RTC precautions were  reviewed with the patient and understanding verbalized.   Follow-up plan: Return in about 1 month (around 06/13/2017) for recheck on medications, recheck ears/cough if persistent problems. See me sooner if needed .  Note: Total time spent 40 minutes, greater than 50% of the visit was spent face-to-face counseling and coordinating care for the following: The primary encounter diagnosis was Bipolar 1 disorder, mixed, partial remission ([place]). Diagnoses of Excessive cerumen in both ear canals, Chronic obstructive pulmonary disease, unspecified COPD type ([place]), and Insomnia due to other mental disorder were also pertinent to this visit.Marland Kitchen  Please note: voice recognition software was used to produce this document, and typos may escape review. Please contact Dr. Sheppard Coil for any needed clarifications.

## 2017-05-17 ENCOUNTER — Encounter: Payer: Self-pay | Admitting: Osteopathic Medicine

## 2017-05-17 ENCOUNTER — Ambulatory Visit (INDEPENDENT_AMBULATORY_CARE_PROVIDER_SITE_OTHER): Payer: Medicare Other | Admitting: Physical Therapy

## 2017-05-17 DIAGNOSIS — M542 Cervicalgia: Secondary | ICD-10-CM

## 2017-05-17 DIAGNOSIS — M62838 Other muscle spasm: Secondary | ICD-10-CM

## 2017-05-17 DIAGNOSIS — R293 Abnormal posture: Secondary | ICD-10-CM

## 2017-05-17 DIAGNOSIS — M25551 Pain in right hip: Secondary | ICD-10-CM

## 2017-05-17 DIAGNOSIS — M6281 Muscle weakness (generalized): Secondary | ICD-10-CM

## 2017-05-17 NOTE — Therapy (Signed)
[place], [place], [address] Phone: [phone]   Fax:  [phone]  Physical Therapy Treatment  Patient Details  Name: Anita Krause MRN: [id number] Date of Birth: [birth year] Referring Provider: Dr Lynne Leader   Encounter Date: 05/17/2017    Past Medical History:  Diagnosis Date  . Bipolar 1 disorder [facility])     Past Surgical History:  Procedure Laterality Date  . ABDOMINAL HYSTERECTOMY    . ANEURYSM COILING    . BACK SURGERY    . POLYPECTOMY     VOCAL CORD    There were no vitals filed for this visit.  Subjective Assessment - 05/17/17 1403    Subjective  Pt reports she is very sore from her fall, she is bruised on her head and the Rt UE is swollen . Reports she doesn't feel like she can do any exercise.  She does want heat if we can possibly do that.     Currently in Pain?  Yes all over pain today                       [facility] Adult PT Treatment/Exercise - 05/17/17 0001      Modalities   Modalities  Moist Heat      Moist Heat Therapy   Number Minutes Moist Heat  20 Minutes    Moist Heat Location  Cervical;Lumbar Spine thoracic and Rt UE per patient request                  PT Long Term Goals - 05/10/17 1246      PT LONG TERM GOAL #1   Title  I with HEP for neck, posture, hip ( 06/21/17)    Time  6    Period  Weeks    Status  New      PT LONG TERM GOAL #2   Title  demo cervical ROM with minimal to no pain ( 06/21/17)     Time  6    Period  Weeks    Status  New      PT LONG TERM GOAL #3   Title  improve lumbar ROM to [facility] with minimal to no pain ( 06/21/17)     Time  6    Period  Weeks    Status  New      PT LONG TERM GOAL #4   Title  improve bilat hip rotation to WNL ( 06/21/17)     Time  6    Period  Weeks    Status  New      PT LONG TERM GOAL #5   Title  improve bilat hip strength =/> 5-/5 ( 06/21/17)     Time  6    Period  Weeks    Status   New      PT LONG TERM GOAL #6   Title  improve FOTO =/< 53% limited  ( 06/21/17)     Time  6    Period  Weeks    Status  New            Plan - 05/17/17 1410    Clinical Impression Statement  Treatment was held today as she fell yesterday and has pain all over.  Large hematoma on her head along with discoloration in her face.  Rt UE is swollen as well.  Patient was encouraged to return to MD for assessment to make sure nothing serious  was going on.     PT Frequency  2x / week    PT Duration  12 weeks    PT Treatment/Interventions  Dry needling;Iontophoresis 4mg /ml Dexamethasone;Moist Heat;Manual techniques;Taping;Patient/family education;Therapeutic activities;Ultrasound;Cryotherapy;Electrical Stimulation;Therapeutic exercise;Vasopneumatic Device    PT Next Visit Plan  manual work to Rt gluts/piriformis , pericervical and occipital muscles.  Progress core /cervical stability    Consulted and Agree with Plan of Care  Patient       Patient will benefit from skilled therapeutic intervention in order to improve the following deficits and impairments:  Pain, Postural dysfunction, Increased muscle spasms, Decreased range of motion, Decreased strength, Hypomobility  Visit Diagnosis: Cervicalgia  Pain in right hip  Muscle weakness (generalized)  Abnormal posture  Other muscle spasm     Problem List Patient Active Problem List   Diagnosis Date Noted  . AAA (abdominal aortic aneurysm) without rupture ([place]) 05/03/2017  . Bursitis of hip, right 05/02/2017  . Right shoulder pain 03/14/2017  . Chronic back pain 03/14/2017  . 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 Abdominal aortic atherosclerosis ([place]) 03/08/2017  . Bipolar 1 disorder, mixed, partial remission ([place]) 09/21/2016  . 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 History of hypertension 09/21/2016  . Tobacco abuse disorder 09/21/2016  . Chronic bronchitis ([place]) 09/21/2016  . Functional memory problem  09/21/2016  . Muscle spasm 09/21/2016  . Anxiety 09/21/2016  . Adjustment disorder with disturbance of emotion 08/29/2016    Jeral Pinch PT  05/17/2017, 2:13 PM  [facility] [place], [place], [address] Phone: [phone]   Fax:  [phone]  Name: Anita Krause MRN: [id number] Date of Birth: [birth year]

## 2017-05-21 ENCOUNTER — Ambulatory Visit (INDEPENDENT_AMBULATORY_CARE_PROVIDER_SITE_OTHER): Payer: Medicare Other | Admitting: Physical Therapy

## 2017-05-21 DIAGNOSIS — M6281 Muscle weakness (generalized): Secondary | ICD-10-CM

## 2017-05-21 DIAGNOSIS — M25551 Pain in right hip: Secondary | ICD-10-CM | POA: Diagnosis not present

## 2017-05-21 DIAGNOSIS — M542 Cervicalgia: Secondary | ICD-10-CM | POA: Diagnosis present

## 2017-05-21 DIAGNOSIS — R293 Abnormal posture: Secondary | ICD-10-CM

## 2017-05-21 DIAGNOSIS — M62838 Other muscle spasm: Secondary | ICD-10-CM

## 2017-05-21 NOTE — Therapy (Signed)
[place], [place], [address] Phone: [phone]   Fax:  [phone]  Physical Therapy Treatment  Patient Details  Name: Anita Krause MRN: [id number] Date of Birth: August 02, 1940 Referring Provider: Dr. Lynne Leader    Encounter Date: 05/21/2017  PT End of Session - 05/21/17 1435    Visit Number  2    Number of Visits  12    Date for PT Re-Evaluation  06/21/17    PT Start Time  5638    PT Stop Time  1521 MHP last 15 min     PT Time Calculation (min)  49 min       Past Medical History:  Diagnosis Date  . Bipolar 1 disorder [facility])     Past Surgical History:  Procedure Laterality Date  . ABDOMINAL HYSTERECTOMY    . ANEURYSM COILING    . BACK SURGERY    . POLYPECTOMY     VOCAL CORD    There were no vitals filed for this visit.  Subjective Assessment - 05/21/17 1437    Subjective  Pt reports she has done a little exercises every day since eval.  She is bruised on her forehead into face (under eyes), and on her Rt knee, from fall 7 days ago.      Currently in Pain?  Yes    Pain Score  5     Pain Location  Generalized neck, hip, knee, ankle (Rt)    Pain Descriptors / Indicators  Sore    Aggravating Factors   standing up, walking    Pain Relieving Factors  medication, heat         [facility] PT Assessment - 05/21/17 0001      Assessment   Medical Diagnosis  cervical pain and Rt hip bursisits    Referring Provider  Dr. Lynne Leader     Onset Date/Surgical Date  05/10/16    Hand Dominance  Right    Next MD Visit  scheduling after PT        [facility] Adult PT Treatment/Exercise - 05/21/17 0001      Neck Exercises: Seated   Other Seated Exercise  W's with 5 sec hold x 10 reps, demo and VC for posture    Other Seated Exercise  scap squeeze x 5 sec x 5 reps      Neck Exercises: Supine   Neck Retraction  10 reps;3 secs    Other Supine Exercise  head press x 5 sec x 5 reps;  shoulder press x 5 sec  x 10 reps      Knee/Hip Exercises: Stretches   Passive Hamstring Stretch  Right;Left;2 reps;30 seconds    Piriformis Stretch  Right;Left;2 reps;30 seconds      Knee/Hip Exercises: Supine   Bridges  Strengthening;Both;1 set;10 reps    Other Supine Knee/Hip Exercises  leg lengthener x 5 sec x 10 reps each leg.       Modalities   Modalities  Moist Heat      Moist Heat Therapy   Number Minutes Moist Heat  15 Minutes    Moist Heat Location  Cervical;Lumbar Spine thoracic1      Manual Therapy   Manual Therapy  Soft tissue mobilization    Soft tissue mobilization  STM to Rt hip (pt in Lt sidelying)  to decrease pain and fascial restrctions.                   PT  Long Term Goals - 05/21/17 1507      PT LONG TERM GOAL #1   Title  I with HEP for neck, posture, hip ( 06/21/17)    Time  6    Period  Weeks    Status  On-going      PT LONG TERM GOAL #2   Title  demo cervical ROM with minimal to no pain ( 06/21/17)     Period  Weeks    Status  On-going      PT LONG TERM GOAL #3   Title  improve lumbar ROM to [facility] with minimal to no pain ( 06/21/17)     Time  6    Period  Weeks    Status  On-going      PT LONG TERM GOAL #4   Title  improve bilat hip rotation to WNL ( 06/21/17)     Time  6    Period  Weeks    Status  On-going      PT LONG TERM GOAL #5   Title  improve bilat hip strength =/> 5-/5 ( 06/21/17)     Time  6    Period  Weeks    Status  On-going      PT LONG TERM GOAL #6   Title  improve FOTO =/< 53% limited  ( 06/21/17)     Time  6    Period  Weeks    Status  On-going            Plan - 05/21/17 1441    Clinical Impression Statement  Pt continues to require multiple cues throughout session for pt to stay on task and for counting.  Pt reported decrased pain level during supine exercise and after MHP.  Progressing towards goals.     PT Frequency  2x / week    PT Duration  12 weeks    PT Next Visit Plan  manual work to Rt gluts/piriformis , pericervical  and occipital muscles.  Progress core /cervical stability    Consulted and Agree with Plan of Care  Patient       Patient will benefit from skilled therapeutic intervention in order to improve the following deficits and impairments:  Pain, Postural dysfunction, Increased muscle spasms, Decreased range of motion, Decreased strength, Hypomobility  Visit Diagnosis: Cervicalgia  Pain in right hip  Muscle weakness (generalized)  Abnormal posture  Other muscle spasm     Problem List Patient Active Problem List   Diagnosis Date Noted  . AAA (abdominal aortic aneurysm) without rupture ([place]) 05/03/2017  . Bursitis of hip, right 05/02/2017  . Right shoulder pain 03/14/2017  . Chronic back pain 03/14/2017  . 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 Abdominal aortic atherosclerosis ([place]) 03/08/2017  . Bipolar 1 disorder, mixed, partial remission ([place]) 09/21/2016  . 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 History of hypertension 09/21/2016  . Tobacco abuse disorder 09/21/2016  . Chronic bronchitis ([place]) 09/21/2016  . Functional memory problem 09/21/2016  . Muscle spasm 09/21/2016  . Anxiety 09/21/2016  . Adjustment disorder with disturbance of emotion 08/29/2016   Kerin Perna, PTA 05/21/17 5:17 PM  [place], [place], [address] Phone: [phone]   Fax:  [phone]  Name: LAILAH MARCELLI MRN: [id number] Date of Birth: 1940/07/10

## 2017-05-21 NOTE — Patient Instructions (Signed)
Therapeutic - Bridging    Lift buttocks, keeping back straight and arms on floor.  Repeat __5-10__ times.  HIP: Hamstrings - Supine  Place strap around foot. Raise leg up, keeping knee straight.  Bend opposite knee to protect back if indicated. Hold 30 seconds. 3 reps per set, 2-3 sets per day  Piriformis Stretch   Lying on back, keep both knees bent.  pull knee toward opposite shoulder. Hold 30 seconds. Repeat 3 times. Do 2- sessions per day.   Shoulder Blade Squeeze    Rotate shoulders back, then squeeze shoulder blades together.  Hold 5 seconds.  Repeat __5__ times. Do _3___ sessions per day.  Upper Back Strength: Lower Trapezius / Rotator Cuff " L's "     Arms in waitress pose, palms up. Press hands back and slide shoulder blades down. Hold for __5__ seconds. Repeat _10___ times. 1-2 times per day.    [facility] at [facility] [place] [address] [place], [place] [address]  [phone] (office) [phone] (fax)

## 2017-05-24 ENCOUNTER — Ambulatory Visit (INDEPENDENT_AMBULATORY_CARE_PROVIDER_SITE_OTHER): Payer: Medicare Other | Admitting: Physical Therapy

## 2017-05-24 DIAGNOSIS — M6281 Muscle weakness (generalized): Secondary | ICD-10-CM

## 2017-05-24 DIAGNOSIS — R293 Abnormal posture: Secondary | ICD-10-CM

## 2017-05-24 DIAGNOSIS — M25551 Pain in right hip: Secondary | ICD-10-CM | POA: Diagnosis present

## 2017-05-24 DIAGNOSIS — M542 Cervicalgia: Secondary | ICD-10-CM | POA: Diagnosis not present

## 2017-05-24 NOTE — Therapy (Signed)
[place], [place], [address] Phone: [phone]   Fax:  [phone]  Physical Therapy Treatment  Patient Details  Name: Anita Krause MRN: [id number] Date of Birth: [birth year] Referring Provider: Dr. Lynne Leader   Encounter Date: 05/24/2017  PT End of Session - 05/24/17 1451    Visit Number  3    Number of Visits  12    Date for PT Re-Evaluation  06/21/17    PT Start Time  6629    PT Stop Time  4765 MHP last 15 min     PT Time Calculation (min)  56 min       Past Medical History:  Diagnosis Date  . Bipolar 1 disorder [facility])     Past Surgical History:  Procedure Laterality Date  . ABDOMINAL HYSTERECTOMY    . ANEURYSM COILING    . BACK SURGERY    . POLYPECTOMY     VOCAL CORD    There were no vitals filed for this visit.  Subjective Assessment - 05/24/17 1434    Subjective  Pt reports she hasn't had much time to exercise because she has been busy unpacking.  Her sister (visiting from [facility]) massaged her hip and this helped. She believes maybe she is starting to have bulging disc in her low back again.     Currently in Pain?  Yes    Pain Score  7     Pain Location  Hip    Pain Orientation  Right    Pain Descriptors / Indicators  Aching    Aggravating Factors   standing up, walking    Pain Relieving Factors  heat, medication         [facility] PT Assessment - 05/24/17 0001      Assessment   Medical Diagnosis  cervical pain and Rt hip bursisits    Referring Provider  Dr. Lynne Leader    Onset Date/Surgical Date  05/10/16    Hand Dominance  Right    Next MD Visit  scheduling after PT      Strength   Right Hip Extension  4/5    Right Hip ABduction  -- 5-/5    Left Hip Extension  4+/5    Left Hip ABduction  -- not tested, pt unable to lay on Rt hip        [facility] Adult PT Treatment/Exercise - 05/24/17 0001      Neck Exercises: Seated   Other Seated Exercise  W's with 5 sec hold x 10  reps, demo and VC for posture    Other Seated Exercise  row's with yellow band x 5 reps x 2 sets  freq cues for posture and head position      Neck Exercises: Supine   Other Supine Exercise  prolonged horiz abdct with bilat arms for stretch across chest x 30 sec x 2 reps;  overhead stretch with bilat shoulder (full flexion) x 10 sec x 3 reps       Lumbar Exercises: Stretches   Standing Extension  4 reps 3 sec each; pt reported cramping in Lt LB, stopped    Passive Hamstring Stretch  Right;Left;2 reps;30 seconds tactile cues for form.     ITB Stretch  Right;1 rep;20 seconds trial, unable to tolerate (supine)    Piriformis Stretch  Right;Left;2 reps;30 seconds      Knee/Hip Exercises: Aerobic   Nustep  L3: 3 min (legs only)  Knee/Hip Exercises: Supine   Bridges  Strengthening;Both;1 set;10 reps    Other Supine Knee/Hip Exercises  leg lengthener x 5 sec x 10 reps each leg.       Moist Heat Therapy   Number Minutes Moist Heat  15 Minutes    Moist Heat Location  Cervical;Lumbar Spine;Hip Rt hip      Manual Therapy   Soft tissue mobilization  STM to Rt hip (pt in Lt sidelying)  to decrease pain and fascial restrctions.                   PT Long Term Goals - 05/21/17 1507      PT LONG TERM GOAL #1   Title  I with HEP for neck, posture, hip ( 06/21/17)    Time  6    Period  Weeks    Status  On-going      PT LONG TERM GOAL #2   Title  demo cervical ROM with minimal to no pain ( 06/21/17)     Period  Weeks    Status  On-going      PT LONG TERM GOAL #3   Title  improve lumbar ROM to [facility] with minimal to no pain ( 06/21/17)     Time  6    Period  Weeks    Status  On-going      PT LONG TERM GOAL #4   Title  improve bilat hip rotation to WNL ( 06/21/17)     Time  6    Period  Weeks    Status  On-going      PT LONG TERM GOAL #5   Title  improve bilat hip strength =/> 5-/5 ( 06/21/17)     Time  6    Period  Weeks    Status  On-going      PT LONG TERM GOAL #6    Title  improve FOTO =/< 53% limited  ( 06/21/17)     Time  6    Period  Weeks    Status  On-going         Plan - 05/24/17 1418    Clinical Impression Statement  Pt requires continual cues for counting and to stay on task.  Pt initially not interested in completing exercises during session, but became agreeable with encouragement and education.  Pt's Rt hip strength has improved since eval.  She tolerated all exercises well, except supine ITB stretch.  Progressing towards goals.    PT Frequency  2x / week    PT Duration  12 weeks    PT Treatment/Interventions  Dry needling;Iontophoresis 4mg /ml Dexamethasone;Moist Heat;Manual techniques;Taping;Patient/family education;Therapeutic activities;Ultrasound;Cryotherapy;Electrical Stimulation;Therapeutic exercise;Vasopneumatic Device    PT Next Visit Plan  gradually progress hip/ postural strengthening. STM as indicated.  Measure hip rotation and cervical ROM.    Consulted and Agree with Plan of Care  Patient       Patient will benefit from skilled therapeutic intervention in order to improve the following deficits and impairments:  Pain, Postural dysfunction, Increased muscle spasms, Decreased range of motion, Decreased strength, Hypomobility  Visit Diagnosis: Pain in right hip  Muscle weakness (generalized)  Abnormal posture  Cervicalgia     Problem List Patient Active Problem List   Diagnosis Date Noted  . AAA (abdominal aortic aneurysm) without rupture ([place]) 05/03/2017  . Bursitis of hip, right 05/02/2017  . Right shoulder pain 03/14/2017  . Chronic back pain 03/14/2017  . 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  Abdominal aortic atherosclerosis ([place]) 03/08/2017  . Bipolar 1 disorder, mixed, partial remission ([place]) 09/21/2016  . 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 History of hypertension 09/21/2016  . Tobacco abuse disorder 09/21/2016  . Chronic bronchitis ([place]) 09/21/2016  . Functional memory problem  09/21/2016  . Muscle spasm 09/21/2016  . Anxiety 09/21/2016  . Adjustment disorder with disturbance of emotion 08/29/2016   Kerin Perna, PTA 05/24/17 3:06 PM  [place], [place], [address] Phone: [phone]   Fax:  [phone]  Name: Anita Krause MRN: [id number] Date of Birth: 01/28/1940

## 2017-05-29 ENCOUNTER — Ambulatory Visit (INDEPENDENT_AMBULATORY_CARE_PROVIDER_SITE_OTHER): Payer: Medicare Other | Admitting: Physical Therapy

## 2017-05-29 DIAGNOSIS — M542 Cervicalgia: Secondary | ICD-10-CM

## 2017-05-29 DIAGNOSIS — M25551 Pain in right hip: Secondary | ICD-10-CM | POA: Diagnosis not present

## 2017-05-29 DIAGNOSIS — M6281 Muscle weakness (generalized): Secondary | ICD-10-CM

## 2017-05-29 DIAGNOSIS — R293 Abnormal posture: Secondary | ICD-10-CM

## 2017-05-29 NOTE — Therapy (Signed)
[place], [place], [address] Phone: [phone]   Fax:  [phone]  Physical Therapy Treatment  Patient Details  Name: Anita Krause MRN: [id number] Date of Birth: [birth year] Referring Provider: Dr. Lynne Leader   Encounter Date: 05/29/2017  PT End of Session - 05/29/17 1232    Visit Number  4    Number of Visits  12    Date for PT Re-Evaluation  06/21/17    PT Start Time  1147    PT Stop Time  1244 MHP last 15 min    PT Time Calculation (min)  57 min       Past Medical History:  Diagnosis Date  . Bipolar 1 disorder [facility])     Past Surgical History:  Procedure Laterality Date  . ABDOMINAL HYSTERECTOMY    . ANEURYSM COILING    . BACK SURGERY    . POLYPECTOMY     VOCAL CORD    There were no vitals filed for this visit.  Subjective Assessment - 05/29/17 1233    Subjective  Pt reports she has been more stiff over last 3 days.  Having a hard time standing up straight.  She's trying to remember to do the exercises when she can.     Currently in Pain?  Yes    Pain Score  5     Pain Location  Thoracic    Pain Orientation  Right    Pain Descriptors / Indicators  Aching;Sore    Aggravating Factors   transitioning to standing up.      Pain Relieving Factors  heat, massage.          [facility] PT Assessment - 05/29/17 0001      Assessment   Medical Diagnosis  cervical pain and Rt hip bursisits    Referring Provider  Dr. Lynne Leader    Onset Date/Surgical Date  05/10/16    Hand Dominance  Right    Next MD Visit  scheduling after PT      AROM   Cervical Flexion  55    Cervical Extension  32    Cervical - Right Side Bend  38    Cervical - Left Side Bend  42    Cervical - Right Rotation  62    Cervical - Left Rotation  70       [facility] Adult PT Treatment/Exercise - 05/29/17 0001      Self-Care   Self-Care  Other Self-Care Comments    Other Self-Care Comments   Pt instructed in self  massage to thoracic/lumbar and hip musculature.  Pt verbalized understanding and returned demo with verbal / tactile cues.       Neck Exercises: Standing   Other Standing Exercises  scap retraction with back against pool noodle x 5 sec x 10 reps;  W's with back against noodle x 3 sec hold, heavy cues for form and counting.     Other Standing Exercises  Row with yellow band x 10 reps.       Neck Exercises: Supine   Other Supine Exercise  overhead pull x 10 with yellow band;  sash with yellow band x 5 reps       Knee/Hip Exercises: Stretches   Passive Hamstring Stretch  Right;Left;2 reps;30 seconds tactile cues for form.     Piriformis Stretch  Right;Left;2 reps;30 seconds      Knee/Hip Exercises: Aerobic   Nustep  L4: 5 min (legs only)  Knee/Hip Exercises: Supine   Bridges  Strengthening;Both;2 sets;5 reps      Moist Heat Therapy   Number Minutes Moist Heat  15 Minutes    Moist Heat Location  Cervical;Lumbar Spine thoracic      Manual Therapy   Soft tissue mobilization  STM to Rt thoracic paraspinals, lat and Rt glute/hip rotators.          PT Long Term Goals - 05/29/17 1157      PT LONG TERM GOAL #1   Title  I with HEP for neck, posture, hip ( 06/21/17)    Time  6    Period  Weeks    Status  On-going      PT LONG TERM GOAL #2   Title  demo cervical ROM with minimal to no pain ( 06/21/17)     Time  6    Period  Weeks    Status  On-going      PT LONG TERM GOAL #3   Title  improve lumbar ROM to [facility] with minimal to no pain ( 06/21/17)     Time  6    Period  Weeks    Status  On-going      PT LONG TERM GOAL #4   Title  improve bilat hip rotation to WNL ( 06/21/17)     Time  6    Period  Weeks    Status  On-going      PT LONG TERM GOAL #5   Title  improve bilat hip strength =/> 5-/5 ( 06/21/17)     Time  6    Period  Weeks    Status  On-going            Plan - 05/29/17 1238    Clinical Impression Statement  Pt tolerated exercises well with all exercises,  requiring less cues to complete full set of reps.  Pt's cervical ROM has improved.  Pt progressing gradually towards goals.     PT Frequency  2x / week    PT Duration  12 weeks    PT Treatment/Interventions  Dry needling;Iontophoresis 4mg /ml Dexamethasone;Moist Heat;Manual techniques;Taping;Patient/family education;Therapeutic activities;Ultrasound;Cryotherapy;Electrical Stimulation;Therapeutic exercise;Vasopneumatic Device    PT Next Visit Plan  gradually progress hip/ postural strengthening. STM as indicated. MMT and ROM for hips.     Consulted and Agree with Plan of Care  Patient       Patient will benefit from skilled therapeutic intervention in order to improve the following deficits and impairments:  Pain, Postural dysfunction, Increased muscle spasms, Decreased range of motion, Decreased strength, Hypomobility  Visit Diagnosis: Cervicalgia  Abnormal posture  Muscle weakness (generalized)  Pain in right hip     Problem List Patient Active Problem List   Diagnosis Date Noted  . AAA (abdominal aortic aneurysm) without rupture ([place]) 05/03/2017  . Bursitis of hip, right 05/02/2017  . Right shoulder pain 03/14/2017  . Chronic back pain 03/14/2017  . 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 Abdominal aortic atherosclerosis ([place]) 03/08/2017  . Bipolar 1 disorder, mixed, partial remission ([place]) 09/21/2016  . 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 History of hypertension 09/21/2016  . Tobacco abuse disorder 09/21/2016  . Chronic bronchitis ([place]) 09/21/2016  . Functional memory problem 09/21/2016  . Muscle spasm 09/21/2016  . Anxiety 09/21/2016  . Adjustment disorder with disturbance of emotion 08/29/2016   Kerin Perna, PTA 05/29/17 12:45 PM  [place],  [place], [address] Phone: [phone]   Fax:  [phone]  Name: Anita Krause MRN: [id number] Date of  Birth: 05/19/1940

## 2017-05-30 ENCOUNTER — Ambulatory Visit (INDEPENDENT_AMBULATORY_CARE_PROVIDER_SITE_OTHER): Payer: Medicare Other

## 2017-05-30 ENCOUNTER — Ambulatory Visit (INDEPENDENT_AMBULATORY_CARE_PROVIDER_SITE_OTHER): Payer: Medicare Other | Admitting: Family Medicine

## 2017-05-30 ENCOUNTER — Encounter: Payer: Self-pay | Admitting: Family Medicine

## 2017-05-30 VITALS — BP 136/58 | HR 76 | Wt 134.0 lb

## 2017-05-30 DIAGNOSIS — G44319 Acute post-traumatic headache, not intractable: Secondary | ICD-10-CM

## 2017-05-30 DIAGNOSIS — I10 Essential (primary) hypertension: Secondary | ICD-10-CM | POA: Diagnosis not present

## 2017-05-30 DIAGNOSIS — G44309 Post-traumatic headache, unspecified, not intractable: Secondary | ICD-10-CM | POA: Diagnosis not present

## 2017-05-30 DIAGNOSIS — F319 Bipolar disorder, unspecified: Secondary | ICD-10-CM | POA: Diagnosis not present

## 2017-05-30 DIAGNOSIS — F17200 Nicotine dependence, unspecified, uncomplicated: Secondary | ICD-10-CM | POA: Diagnosis not present

## 2017-05-30 DIAGNOSIS — M25531 Pain in right wrist: Secondary | ICD-10-CM

## 2017-05-30 DIAGNOSIS — F5101 Primary insomnia: Secondary | ICD-10-CM | POA: Diagnosis not present

## 2017-05-30 NOTE — Progress Notes (Signed)
Anita Krause is a 77 y.o. female who presents to [facility] today for right wrist pain and head contusion.  Anita Krause is been seen several times for multiple different orthopedic complaints.  She was last seen on May 15.  At that point she had suffered a fall previously.  She did have a head contusion but at that point was asymptomatic.  She notes that the contusion is improving but she is developed a mild headache.  She has a pertinent history for cerebral aneurysms.  She denies any history of bleeds or skull fracture.  She denies any loss of function weakness or numbness.  Additionally she notes right wrist pain.  This is a new issue since the fall.  She notes that she did have some mild wrist pain after the fall but now is been a little bit worse.  She rates the pain is moderate.  She points to the ulnar aspect of the wrist and notes it is still painful with wrist motion.  She denies any radiating pain weakness or numbness.  She continues to experience right lateral hip pain and right shoulder pain.  She received physical therapy for these issues and notes mild improvement.    ROS:  As above  Exam:  BP (!) 136/58   Pulse 76   Wt 134 lb (60.8 kg)   BMI 24.12 kg/m  General: Well Developed, well nourished, and in no acute distress.  Neuro/Psych: Alert and oriented x3, extra-ocular muscles intact, able to move all 4 extremities, sensation grossly intact.  Normal coordination gait and balance. Skin: Warm and dry, no rashes noted.  Respiratory: Not using accessory muscles, speaking in full sentences, trachea midline.  Cardiovascular: Pulses palpable, no extremity edema. Abdomen: Does not appear distended. MSK:  Right wrist: Ecchymosis at the ulnar aspect of the wrist.  Mildly tender to palpation along the ulnar styloid and shaft.  Wrist motion is intact but pain present with extension and supination.    Lab and Radiology Results X-ray   right wrist images personally independently reviewed: reveals a oblique line through the distal ulnar shaft.  The oblique line is present in multiple views but does not obviously transverse the cortex.  Is concerning for fracture as this is the area of pain on exam.  Otherwise wrist x-ray is remarkable only for DJD. Awaiting formal radiology review  CT scan head without contrast images reviewed No obvious skull fracture or hematoma or hemorrhage present. Coil present on CT scan Awaiting formal radiology review   Assessment and Plan: 77 y.o. female with  Right wrist pain: Linear hypodensity of the ulna shaft seen on x-ray is not obviously a fracture as it does not clearly cross the cortex but is somewhat concerning as this is the area of maximal pain and is present on multiple different images.  This patient is still symptomatic and there is some concern for fracture I fitted her with a short arm EXOS cast which she notes improved her pain.  Plan to recheck in 2 weeks.   Headache: CT scan obtained today as patient had a history of trauma.  No obvious hematoma seen on CT scan.  Awaiting formal radiology review.  Hip and shoulder pain: Deferred today.  Continue physical therapy recheck 2 weeks.    Orders Placed This Encounter  Procedures  . CT Head Wo Contrast    Standing Status:   Future    Number of Occurrences:   1  Standing Expiration Date:   08/31/2018    Order Specific Question:   Preferred imaging location?    Answer:   [facility]    Order Specific Question:   Radiology Contrast Protocol - do NOT remove file path    Answer:   \\[email or web address].pdf  . DG Wrist Complete Right    Standing Status:   Future    Number of Occurrences:   1    Standing Expiration Date:   07/31/2018    Order Specific Question:   Reason for Exam (SYMPTOM  OR DIAGNOSIS REQUIRED)    Answer:   right ulnar wrist and forarm pain after fall    Order Specific Question:    Preferred imaging location?    Answer:   [facility]    Order Specific Question:   Radiology Contrast Protocol - do NOT remove file path    Answer:   \\[email or web address]s.pdf   No orders of the defined types were placed in this encounter.   Historical information moved to improve visibility of documentation.  Past Medical History:  Diagnosis Date  . Bipolar 1 disorder [facility])    Past Surgical History:  Procedure Laterality Date  . ABDOMINAL HYSTERECTOMY    . ANEURYSM COILING    . BACK SURGERY    . POLYPECTOMY     VOCAL CORD   Social History   Tobacco Use  . Smoking status: Current Some Day Smoker    Types: Cigarettes  . Smokeless tobacco: Never Used  . Tobacco comment: 05/16/17 As per pt, smokes one cig occassionally  Substance Use Topics  . Alcohol use: No    Frequency: Never   family history includes Cancer in her sister; Diabetes in her mother; Hyperlipidemia in her father and mother; Hypertension in her father and mother.  Medications: Current Outpatient Medications  Medication Sig Dispense Refill  . albuterol (PROVENTIL HFA;VENTOLIN HFA) 108 (90 Base) MCG/ACT inhaler Inhale 1-2 puffs into the lungs every 4 (four) hours as needed for wheezing or shortness of breath. 2 Inhaler 11  . aspirin EC 81 MG tablet Take 1 tablet (81 mg total) by mouth daily. For prevention of heart attack/stroke 90 tablet 3  . cyclobenzaprine (FLEXERIL) 5 MG tablet Take 1 tablet (5 mg total) by mouth 3 (three) times daily as needed for muscle spasms. 30 tablet 2  . divalproex (DEPAKOTE) 250 MG DR tablet TK 3 TS PO HS  0  . Fluticasone-Umeclidin-Vilant (TRELEGY ELLIPTA) 100-62.5-25 MCG/INH AEPB Inhale 1 Inhaler into the lungs daily. For chronic bronchitis / COPD 60 each 11  . hydrOXYzine (ATARAX/VISTARIL) 25 MG tablet Take 25 mg by mouth 2 (two) times daily as needed for anxiety.    [place] Kitchen levothyroxine (SYNTHROID, LEVOTHROID) 50 MCG tablet Take 1 tablet (50  mcg total) by mouth daily before breakfast. For low functioning thyroid 90 tablet 1  . naproxen (NAPROSYN) 500 MG tablet Take 1 tablet (500 mg total) by mouth 2 (two) times daily with a meal. For pain/arthritis 180 tablet 3  . potassium chloride SA (K-DUR,KLOR-CON) 20 MEQ tablet Take 1 tablet (20 mEq total) by mouth 2 (two) times daily. For low potassium on your high blood pressure medicine 180 tablet 3  . traZODone (DESYREL) 100 MG tablet TAKE 1 TABLET(100 MG) BY MOUTH AT BEDTIME 90 tablet 1  . triamterene-hydrochlorothiazide (DYAZIDE) 37.5-25 MG capsule Take 1 each (1 capsule total) by mouth 2 (two) times daily. For high blood pressure 180 capsule 1   No current facility-administered medications for this visit.  Allergies  Allergen Reactions  . Erythromycin Anaphylaxis    Throat closes up, ling in mouth peals  . Nitrofurantoin Macrocrystal Anaphylaxis    Pt states her throat closes up  . Penicillins Anaphylaxis  . Colesevelam Hcl Other (See Comments)  . Lamotrigine Rash      Discussed warning signs or symptoms. Please see discharge instructions. Patient expresses understanding.

## 2017-05-30 NOTE — Patient Instructions (Signed)
Thank you for coming in today. I will get CT and xray results to you soon.  Recheck in 2 weeks.  Use the cast/splint.  Ok to remove for bathing unless we tell you otherwise.  Continue PT

## 2017-06-01 ENCOUNTER — Encounter: Payer: Self-pay | Admitting: Physical Therapy

## 2017-06-04 ENCOUNTER — Ambulatory Visit (INDEPENDENT_AMBULATORY_CARE_PROVIDER_SITE_OTHER): Payer: Medicare Other | Admitting: Physical Therapy

## 2017-06-04 DIAGNOSIS — M6281 Muscle weakness (generalized): Secondary | ICD-10-CM

## 2017-06-04 DIAGNOSIS — M542 Cervicalgia: Secondary | ICD-10-CM | POA: Diagnosis present

## 2017-06-04 DIAGNOSIS — M25551 Pain in right hip: Secondary | ICD-10-CM | POA: Diagnosis not present

## 2017-06-04 DIAGNOSIS — R293 Abnormal posture: Secondary | ICD-10-CM | POA: Diagnosis not present

## 2017-06-04 NOTE — Therapy (Addendum)
[place], [place], [address] Phone: [phone]   Fax:  [phone]  Physical Therapy Treatment  Patient Details  Name: Anita Krause MRN: [id number] Date of Birth: 03-18-1940 Referring Provider: Dr. Lynne Leader    Encounter Date: 06/04/2017  PT End of Session - 06/04/17 1410    Visit Number  5    Number of Visits  12    Date for PT Re-Evaluation  06/21/17    PT Start Time  3009    PT Stop Time  1432    PT Time Calculation (min)  29 min    Activity Tolerance  Patient tolerated treatment well;No increased pain    Behavior During Therapy  WFL for tasks assessed/performed       Past Medical History:  Diagnosis Date  . Bipolar 1 disorder [facility])     Past Surgical History:  Procedure Laterality Date  . ABDOMINAL HYSTERECTOMY    . ANEURYSM COILING    . BACK SURGERY    . POLYPECTOMY     VOCAL CORD    There were no vitals filed for this visit.  Subjective Assessment - 06/04/17 1410    Subjective  Pt reports she is feeling much better.  She has been doing chin tucks and a couple leg exercises here and there. She's excited that her bruises from fall are almost gone.  she reports 50% improvement in symptoms since initiating therapy.     Currently in Pain?  No/denies    Pain Score  0-No pain         [facility] PT Assessment - 06/04/17 0001      Assessment   Medical Diagnosis  cervical pain and Rt hip bursisits    Referring Provider  Dr. Lynne Leader     Onset Date/Surgical Date  05/10/16    Hand Dominance  Right    Next MD Visit  scheduling after PT      AROM   Lumbar Flexion  95%     Lumbar Extension  40%    Lumbar - Right Rotation  25%    Lumbar - Left Rotation  10%        [facility] Adult PT Treatment/Exercise - 06/04/17 0001      Neck Exercises: Standing   Other Standing Exercises  scap retraction and  W's with back against noodle x 5 sec hold, minor cues for form and counting.       Neck Exercises: Supine   Other Supine Exercise  overhead pull x 10 with yellow band; bilat horiz abdct with red band x 15 reps (cues on speed and form);  sash with yellow band x 12 reps each arm       Knee/Hip Exercises: Aerobic   Nustep  L4: 5 min (arms/legs)      Knee/Hip Exercises: Supine   Bridges  Strengthening;Both;1 set;10 reps      Knee/Hip Exercises: Sidelying   Clams  Rt/Lt x 10; RLE reverse clams x 10.       Modalities   Modalities  -- pt declined             PT Education - 06/04/17 1426    Education provided  Yes    Education Details  HEP, yellow band issued.     Person(s) Educated  Patient    Methods  Explanation;Handout;Verbal cues;Tactile cues;Demonstration    Comprehension  Verbalized understanding;Returned demonstration          PT Long  Term Goals - 06/04/17 1412      PT LONG TERM GOAL #1   Title  I with HEP for neck, posture, hip ( 06/21/17)    Time  6    Period  Weeks    Status  On-going      PT LONG TERM GOAL #2   Title  demo cervical ROM with minimal to no pain ( 06/21/17)     Time  6    Period  Weeks    Status  On-going      PT LONG TERM GOAL #3   Title  improve lumbar ROM to [facility] with minimal to no pain ( 06/21/17)     Time  6    Period  Weeks    Status  On-going      PT LONG TERM GOAL #4   Title  improve bilat hip rotation to WNL ( 06/21/17)     Time  6    Period  Weeks    Status  On-going      PT LONG TERM GOAL #5   Title  improve bilat hip strength =/> 5-/5 ( 06/21/17)     Time  6    Period  Weeks    Status  On-going      PT LONG TERM GOAL #6   Title  improve FOTO =/< 53% limited  ( 06/21/17)     Time  6    Period  Weeks    Status  On-going            Plan - 06/04/17 1429    Clinical Impression Statement  50% improvement in symptoms reported by pt.  Pt tolerated all exercises well, without increase in pain.  Pt declined modalities; will use heating pad at home. Progressing well towards goals.     PT Frequency  2x /  week    PT Duration  12 weeks    PT Treatment/Interventions  Dry needling;Iontophoresis 69m/ml Dexamethasone;Moist Heat;Manual techniques;Taping;Patient/family education;Therapeutic activities;Ultrasound;Cryotherapy;Electrical Stimulation;Therapeutic exercise;Vasopneumatic Device    PT Next Visit Plan  gradually progress hip/ postural strengthening.  MMT and ROM for hips.     Consulted and Agree with Plan of Care  Patient       Patient will benefit from skilled therapeutic intervention in order to improve the following deficits and impairments:  Pain, Postural dysfunction, Increased muscle spasms, Decreased range of motion, Decreased strength, Hypomobility  Visit Diagnosis: Cervicalgia  Abnormal posture  Muscle weakness (generalized)  Pain in right hip     Problem List Patient Active Problem List   Diagnosis Date Noted  . AAA [place]abdominal aortic aneurysm) [place] rupture ([place] 05/03/2017  . Bursitis of hip, right 05/02/2017  . Right shoulder pain 03/14/2017  . Chronic back pain 03/14/2017  . 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 Abdominal aortic atherosclerosis ([place] 03/08/2017  . Bipolar 1 disorder, mixed, partial remission ([place] 09/21/2016  . 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 History of hypertension 09/21/2016  . Tobacco abuse disorder 09/21/2016  . Chronic bronchitis ([place] 09/21/2016  . Functional memory problem 09/21/2016  . Muscle spasm 09/21/2016  . Anxiety 09/21/2016  . Adjustment disorder with disturbance of emotion 08/29/2016   JKerin Perna PTA 06/04/17 4:04 PM  [facility] [address] [address]Phone: [phone]  Fax:  [phone] Name: Anita BURESMRN: [id number] of Birth: [id number]  PHYSICAL THERAPY DISCHARGE SUMMARY  Visits from  Start of Care: 5  Current functional level related to goals / functional outcomes: Unknown,  patient has not shown for her last two scheduled appointments and not returned call about follow up   Remaining deficits: unknown   Education / Equipment: HEP Plan:                                                    Patient goals were not met. Patient is being discharged due to not returning since the last visit.  ?????    Jeral Pinch, PT 06/21/17 11:10 AM

## 2017-06-04 NOTE — Patient Instructions (Signed)
Clam    Lie on side, legs bent 90. Open top knee to ceiling, rotating leg outward. Touch toes to ankle of bottom leg. Close knees, rotating leg inward. Maintain hip position. Repeat _10___ times. Repeat on other side. Do __1__ sessions per day.  Piriformis Stretch    Lying on back, pull right knee toward opposite shoulder. Hold __30__ seconds. Repeat __2__ times. Do __2__ sessions per day.  Over Head Pull: Narrow Grip        On back, knees bent, feet flat, band across thighs, elbows straight but relaxed. Pull hands apart (start). Keeping elbows straight, bring arms up and over head, hands toward floor. Keep pull steady on band. Hold momentarily. Return slowly, keeping pull steady, back to start. Repeat _10__ times. Band color _yellow _____   Side Pull: Double Arm   On back, knees bent, feet flat. Arms perpendicular to body, shoulder level, elbows straight but relaxed. Pull arms out to sides, elbows straight. Resistance band comes across collarbones, hands toward floor. Hold momentarily. Slowly return to starting position. Repeat _10__ times. Band color __yellow___   Sash   On back, knees bent, feet flat, left hand on left hip, right hand above left. Pull right arm DIAGONALLY (hip to shoulder) across chest. Bring right arm along head toward floor. Hold momentarily. Slowly return to starting position. Repeat _10__ times. Do with left arm. Band color __yellow ____

## 2017-06-07 ENCOUNTER — Encounter: Payer: Self-pay | Admitting: Physical Therapy

## 2017-06-07 ENCOUNTER — Telehealth: Payer: Self-pay | Admitting: Physical Therapy

## 2017-06-07 NOTE — Telephone Encounter (Signed)
Patient missed physical therapy appt.  Call was placed and there was no answer.  Left message on voice mail for her to return phone call.   Kerin Perna, PTA 06/07/17 3:00 PM

## 2017-06-12 ENCOUNTER — Encounter: Payer: Self-pay | Admitting: Physical Therapy

## 2017-06-14 ENCOUNTER — Encounter: Payer: Self-pay | Admitting: Physical Therapy

## 2017-06-18 ENCOUNTER — Ambulatory Visit: Payer: Self-pay | Admitting: Family Medicine

## 2017-06-19 ENCOUNTER — Encounter: Payer: Self-pay | Admitting: Physical Therapy

## 2017-06-21 ENCOUNTER — Encounter: Payer: Self-pay | Admitting: Physical Therapy

## 2017-07-02 ENCOUNTER — Encounter: Payer: Self-pay | Admitting: Family Medicine

## 2017-07-02 ENCOUNTER — Ambulatory Visit (INDEPENDENT_AMBULATORY_CARE_PROVIDER_SITE_OTHER): Payer: Medicare Other | Admitting: Family Medicine

## 2017-07-02 VITALS — BP 137/69 | HR 84 | Wt 134.0 lb

## 2017-07-02 DIAGNOSIS — I700 Atherosclerosis of aorta: Secondary | ICD-10-CM

## 2017-07-02 DIAGNOSIS — I671 Cerebral aneurysm, nonruptured: Secondary | ICD-10-CM

## 2017-07-02 DIAGNOSIS — I714 Abdominal aortic aneurysm, without rupture, unspecified: Secondary | ICD-10-CM

## 2017-07-02 DIAGNOSIS — R300 Dysuria: Secondary | ICD-10-CM | POA: Diagnosis not present

## 2017-07-02 HISTORY — DX: Cerebral aneurysm, nonruptured: I67.1

## 2017-07-02 NOTE — Patient Instructions (Addendum)
Thank you for coming in today.  You should hear from the vascular surgeons soon.  Let me know if you do not hear anything.  Follow up with vascular surgery soon.   I will see what urine culture shows.  We can pick an antibiotic.   Recheck with Dr Sheppard Coil as needed.

## 2017-07-02 NOTE — Progress Notes (Signed)
Anita Krause is a 77 y.o. female who presents to [facility] today for follow-up wrist and trochanteric bursa pain as well as follow-up cerebral and aortic aneurysm and discuss dysuria.  Been seeing [name] several times now for various orthopedic issues including most recently wrist and trochanteric pain.  She is feeling a lot better now notes that her pain is gone was completely resolved.  Additionally as part of her initial work-up for back pain she had what appeared to be an abdominal aortic aneurysm seen on a lumbar spine x-ray.  As part of the follow-up for this we obtained an abdominal aortic vascular study showing a less than 4 cm abdominal aortic aneurysm.  Additionally she has a history of intracerebral aneurysm status post coiling.  She denies severe leg pain or abdominal pain.  Lastly she notes a several day history of dysuria.  She has urinary frequency urgency and dysuria.  Symptoms are consistent with previous urinary tract infection.  She has been using AZO which does help.  Last took AZO just prior to arrival and notes that her urine is discolored orange/red.  She would like to avoid antibiotics if she does not fact have a urinary tract infection.   ROS as above:  Exam:  BP 137/69   Pulse 84   Wt 134 lb (60.8 kg)   BMI 24.12 kg/m  Gen: Well NAD HEENT: EOMI,  MMM Lungs: Normal work of breathing. CTABL Heart: RRR no MRG Abd: NABS, Soft. Nondistended, Nontender no CVA angle tenderness to percussion Exts: Brisk capillary refill, warm and well perfused.  Normal gait   Lab and Radiology Results AAA duplex study: Final Interpretation: IVC/Iliac: The distal aortic aneurysmal dilatation measures 3.4 x 3.4 cms.  Electronically signed by Deitra Mayo on 05/02/2017 at 1:03:11 PM.  EXAM: CT HEAD WITHOUT CONTRAST  TECHNIQUE: Contiguous axial  images were obtained from the base of the skull through the vertex without intravenous contrast.  COMPARISON:  CT head 08/28/2016  FINDINGS: Brain: Ventricle size normal. Negative for acute infarct. Negative for acute hemorrhage or mass. 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   Electronically Signed   By: Franchot Gallo M.D.   On: 05/30/2017 16:56 I personally (independently) visualized and performed the interpretation of the images attached in this note.    Assessment and Plan: 77 y.o. female with  Orthopedic pain: Improved.  Watchful waiting.  Abdominal aortic aneurysm less than 4 cm.  Blood pressure reasonably well controlled.  Defer further management to PCP however we will go ahead and refer to vascular surgery today as I think good follow-up for this issue is reasonable and Callen has lots of questions about her potential treatment options.  She would very much like to avoid a significant open abdominal surgery.  Dysuria: Concerning for UTI.  Patient has multiple antibiotic drug allergies.  Plan for culture and if UTI present we will treat based on sensitivity and intolerances.  Follow-up with PCP.  I am happy to continue to see [name] as needed as well as for orthopedic or sports medicine related issues.   Orders Placed This Encounter  Procedures  . Urine Culture  . Ambulatory referral to Vascular Surgery  Referral Priority:   Routine    Referral Type:   Surgical    Referral Reason:   Specialty Services Required    Requested Specialty:   Vascular Surgery    Number of Visits Requested:   1   No orders of the defined types were placed in this encounter.    Historical information  moved to improve visibility of documentation.  Past Medical History:  Diagnosis Date  . Bipolar 1 disorder [facility])    Past Surgical History:  Procedure Laterality Date  . ABDOMINAL HYSTERECTOMY    . ANEURYSM COILING    . BACK SURGERY    . POLYPECTOMY     VOCAL CORD   Social History   Tobacco Use  . Smoking status: Current Some Day Smoker    Types: Cigarettes  . Smokeless tobacco: Never Used  . Tobacco comment: 05/16/17 As per pt, smokes one cig occassionally  Substance Use Topics  . Alcohol use: No    Frequency: Never   family history includes Cancer in her sister; Diabetes in her mother; Hyperlipidemia in her father and mother; Hypertension in her father and mother.  Medications: Current Outpatient Medications  Medication Sig Dispense Refill  . albuterol (PROVENTIL HFA;VENTOLIN HFA) 108 (90 Base) MCG/ACT inhaler Inhale 1-2 puffs into the lungs every 4 (four) hours as needed for wheezing or shortness of breath. 2 Inhaler 11  . aspirin EC 81 MG tablet Take 1 tablet (81 mg total) by mouth daily. For prevention of heart attack/stroke 90 tablet 3  . cyclobenzaprine (FLEXERIL) 5 MG tablet Take 1 tablet (5 mg total) by mouth 3 (three) times daily as needed for muscle spasms. 30 tablet 2  . divalproex (DEPAKOTE) 250 MG DR tablet TK 3 TS PO HS  0  . Fluticasone-Umeclidin-Vilant (TRELEGY ELLIPTA) 100-62.5-25 MCG/INH AEPB Inhale 1 Inhaler into the lungs daily. For chronic bronchitis / COPD 60 each 11  . hydrOXYzine (ATARAX/VISTARIL) 25 MG tablet Take 25 mg by mouth 2 (two) times daily as needed for anxiety.    Marland Kitchen levothyroxine (SYNTHROID, LEVOTHROID) 50 MCG tablet Take 1 tablet (50 mcg total) by mouth daily before breakfast. For low functioning thyroid 90 tablet 1  . naproxen (NAPROSYN) 500 MG tablet Take 1 tablet (500 mg total) by mouth 2 (two) times daily with a meal. For pain/arthritis 180 tablet 3  . potassium chloride SA (K-DUR,KLOR-CON) 20 MEQ tablet Take 1 tablet (20 mEq total) by mouth  2 (two) times daily. For low potassium on your high blood pressure medicine 180 tablet 3  . traZODone (DESYREL) 100 MG tablet TAKE 1 TABLET(100 MG) BY MOUTH AT BEDTIME 90 tablet 1  . triamterene-hydrochlorothiazide (DYAZIDE) 37.5-25 MG capsule Take 1 each (1 capsule total) by mouth 2 (two) times daily. For high blood pressure 180 capsule 1   No current facility-administered medications for this visit.    Allergies  Allergen Reactions  . Erythromycin Anaphylaxis    Throat closes up, ling in mouth peals  . Nitrofurantoin Macrocrystal Anaphylaxis    Pt states her throat closes up  . Penicillins Anaphylaxis  . Colesevelam Hcl Other (See Comments)  . Lamotrigine Rash     Discussed warning signs or symptoms. Please see discharge instructions. Patient expresses understanding.

## 2017-07-04 LAB — URINE CULTURE
MICRO NUMBER:: 90784357
SPECIMEN QUALITY: ADEQUATE

## 2017-07-05 MED ORDER — CIPROFLOXACIN HCL 500 MG PO TABS: 500.0000 mg | ORAL_TABLET | Freq: Two times a day (BID) | ORAL | 0 refills | Status: DC

## 2017-07-05 NOTE — Addendum Note (Signed)
Addended by: Gregor Hams on: 07/05/2017 05:40 AM   Modules accepted: Orders

## 2017-07-11 DIAGNOSIS — J449 Chronic obstructive pulmonary disease, unspecified: Secondary | ICD-10-CM | POA: Diagnosis not present

## 2017-07-11 DIAGNOSIS — I10 Essential (primary) hypertension: Secondary | ICD-10-CM | POA: Diagnosis not present

## 2017-07-11 DIAGNOSIS — F17200 Nicotine dependence, unspecified, uncomplicated: Secondary | ICD-10-CM | POA: Diagnosis not present

## 2017-07-11 DIAGNOSIS — F319 Bipolar disorder, unspecified: Secondary | ICD-10-CM | POA: Diagnosis not present

## 2017-07-13 ENCOUNTER — Emergency Department (INDEPENDENT_AMBULATORY_CARE_PROVIDER_SITE_OTHER)
Admission: EM | Admit: 2017-07-13 | Discharge: 2017-07-13 | Disposition: A | Discharge status: Home / Self Care | Payer: Medicare Other | Source: Home / Self Care | Attending: Family Medicine | Admitting: Family Medicine

## 2017-07-13 ENCOUNTER — Encounter: Payer: Self-pay | Admitting: Emergency Medicine

## 2017-07-13 ENCOUNTER — Other Ambulatory Visit: Payer: Self-pay

## 2017-07-13 DIAGNOSIS — R1013 Epigastric pain: Secondary | ICD-10-CM | POA: Diagnosis not present

## 2017-07-13 DIAGNOSIS — Z8679 Personal history of other diseases of the circulatory system: Secondary | ICD-10-CM

## 2017-07-13 NOTE — ED Provider Notes (Signed)
[name]    CSN: [id number] Arrival date & time: 07/13/17  1625     History   Chief Complaint Chief Complaint  Patient presents with  . Hypertension    HPI Anita Krause is a 77 y.o. female.   Patient complains of headache and increased BP for two days.  She admits that she does not take her Dyazide every day.  Last night she checked her BP and noted systolic pressure of 371.  She took 3 Dyazide tabs and aspirin.  She has had recurring pain under her breasts during the past two days, now resolved.  No chest pain with activity.  No shortness of breath.  No nausea/vomiting. She has a pertinent history of cerebral aneurysms. She has a past history of GERD.   The history is provided by the patient.    Past Medical History:  Diagnosis Date  . Bipolar 1 disorder [facility])     Patient Active Problem List   Diagnosis Date Noted  . 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  . AAA (abdominal aortic aneurysm) without rupture ([place]) 05/03/2017  . Bursitis of hip, right 05/02/2017  . Right shoulder pain 03/14/2017  . Chronic back pain 03/14/2017  . 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 Abdominal aortic atherosclerosis ([place]) 03/08/2017  . Bipolar 1 disorder, mixed, partial remission ([place]) 09/21/2016  . 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 History of hypertension 09/21/2016  . Tobacco abuse disorder 09/21/2016  . Chronic bronchitis ([place]) 09/21/2016  . Functional memory problem 09/21/2016  . Muscle spasm 09/21/2016  . Anxiety 09/21/2016  . Adjustment disorder with disturbance of emotion 08/29/2016    Past Surgical History:  Procedure Laterality Date  . ABDOMINAL HYSTERECTOMY    . ANEURYSM COILING    . BACK SURGERY    . POLYPECTOMY     VOCAL CORD    OB History   None      Home Medications    Prior to Admission medications   Medication Sig Start Date End Date Taking? Authorizing Provider  albuterol (PROVENTIL HFA;VENTOLIN HFA) 108 (90 Base)  MCG/ACT inhaler Inhale 1-2 puffs into the lungs every 4 (four) hours as needed for wheezing or shortness of breath. 03/07/17   Emeterio Reeve, DO  aspirin EC 81 MG tablet Take 1 tablet (81 mg total) by mouth daily. For prevention of heart attack/stroke 03/07/17   Emeterio Reeve, DO  ciprofloxacin (CIPRO) 500 MG tablet Take 1 tablet (500 mg total) by mouth 2 (two) times daily. 07/05/17   Gregor Hams, MD  cyclobenzaprine (FLEXERIL) 5 MG tablet Take 1 tablet (5 mg total) by mouth 3 (three) times daily as needed for muscle spasms. 05/16/17   Emeterio Reeve, DO  divalproex (DEPAKOTE) 250 MG DR tablet TK 3 TS PO HS 04/20/17   [provider]  Fluticasone-Umeclidin-Vilant (TRELEGY ELLIPTA) 100-62.5-25 MCG/INH AEPB Inhale 1 Inhaler into the lungs daily. For chronic bronchitis / COPD 03/07/17   Emeterio Reeve, DO  hydrOXYzine (ATARAX/VISTARIL) 25 MG tablet Take 25 mg by mouth 2 (two) times daily as needed for anxiety.    [provider]  levothyroxine (SYNTHROID, LEVOTHROID) 50 MCG tablet Take 1 tablet (50 mcg total) by mouth daily before breakfast. For low functioning thyroid 03/07/17   Emeterio Reeve, DO  naproxen (NAPROSYN) 500 MG tablet Take 1 tablet (500 mg total) by mouth 2 (two) times daily with a meal. For pain/arthritis 03/07/17   Emeterio Reeve, DO  potassium chloride SA (K-DUR,KLOR-CON) 20 MEQ tablet  Take 1 tablet (20 mEq total) by mouth 2 (two) times daily. For low potassium on your high blood pressure medicine 03/07/17   Emeterio Reeve, DO  traZODone (DESYREL) 100 MG tablet TAKE 1 TABLET(100 MG) BY MOUTH AT BEDTIME 10/17/16   Emeterio Reeve, DO  triamterene-hydrochlorothiazide (DYAZIDE) 37.5-25 MG capsule Take 1 each (1 capsule total) by mouth 2 (two) times daily. For high blood pressure 03/07/17   Emeterio Reeve, DO    Family History Family History  Problem Relation Age of Onset  . Hyperlipidemia Mother   . Hypertension Mother   . Diabetes Mother   .  Hyperlipidemia Father   . Hypertension Father   . Cancer Sister     Social History Social History   Tobacco Use  . Smoking status: Current Some Day Smoker    Types: Cigarettes  . Smokeless tobacco: Never Used  . Tobacco comment: 05/16/17 As per pt, smokes one cig occassionally  Substance Use Topics  . Alcohol use: No    Frequency: Never  . Drug use: No     Allergies   Erythromycin; Nitrofurantoin macrocrystal; Penicillins; Colesevelam hcl; and Lamotrigine   Review of Systems Review of Systems  Constitutional: Negative for appetite change, diaphoresis, fatigue and fever.  HENT: Negative.   Eyes: Negative.   Respiratory: Positive for chest tightness. Negative for cough, shortness of breath, wheezing and stridor.   Cardiovascular: Positive for chest pain. Negative for palpitations and leg swelling.  Gastrointestinal: Negative for abdominal pain, nausea and vomiting.  Genitourinary: Negative.   Musculoskeletal: Negative.   Skin: Negative.   Neurological: Positive for headaches. Negative for dizziness, tremors, seizures, syncope, facial asymmetry, speech difficulty, weakness, light-headedness and numbness.     Physical Exam Triage Vital Signs ED Triage Vitals  Enc Vitals Group     BP 07/13/17 1657 (!) 149/78     Pulse Rate 07/13/17 1657 88     Resp --      Temp 07/13/17 1657 98.4 F (36.9 C)     Temp Source 07/13/17 1657 Oral     SpO2 07/13/17 1657 97 %     Weight 07/13/17 1659 132 lb (59.9 kg)     Height 07/13/17 1659 5\' 4"  (1.626 m)     Head Circumference --      Peak Flow --      Pain Score 07/13/17 1658 2     Pain Loc --      Pain Edu? --      Excl. in Export? --    No data found.  Updated Vital Signs BP (!) 149/78 (BP Location: Right Arm)   Pulse 88   Temp 98.4 F (36.9 C) (Oral)   Ht 5\' 4"  (1.626 m)   Wt 132 lb (59.9 kg)   SpO2 97%   BMI 22.66 kg/m   Visual Acuity Right Eye Distance:   Left Eye Distance:   Bilateral Distance:    Right Eye  Near:   Left Eye Near:    Bilateral Near:     Physical Exam Nursing notes and Vital Signs reviewed. Appearance:  Patient appears stated age, and in no acute distress.  She is alert and oriented.  Eyes:  Pupils are equal, round, and reactive to light and accomodation.  Extraocular movement is intact.  Conjunctivae are not inflamed   Pharynx:  Normal; moist mucous membranes  Neck:  Supple.  No adenopathy Lungs:  Clear to auscultation.  Breath sounds are equal.  Moving air well. Heart:  Regular rate  and rhythm without murmurs, rubs, or gallops.  Abdomen:  Nontender without masses or hepatosplenomegaly.  Bowel sounds are present.  No CVA or flank tenderness.  Extremities:  No edema.  Skin:  Skin warm and dry.  No rash present.    Neurologic:  Cranial nerves 2 through 12 are normal.     UC Treatments / Results  Labs (all labs ordered are listed, but only abnormal results are displayed) Labs Reviewed - No data to display  EKG  Rate:  75 BPM PR:  146 msec QT:  362 msec QTcH:  404 msec QRSD:  86 msec QRS axis:  24 degrees Interpretation:  Normal sinus rhythm.  Borderline left atrial enlargement.  No acute changes.  No significant change from previous EKG done 28 August 2016  Radiology No results found.  Procedures Procedures (including critical care time)  Medications Ordered in UC Medications - No data to display  Initial Impression / Assessment and Plan / UC Course  I have reviewed the triage vital signs and the nursing notes.  Pertinent labs & imaging results that were available during my care of the patient were reviewed by me and considered in my medical decision making (see chart for details).    Unremarkable exam and EKG today. Recommend taking low dose Dyazide daily (encouraged to take a dose each day) ?GERD; add Prilosec OTC if abdominal pain recurs. Followup with Family Doctor in about one week.   Final Clinical Impressions(s) / UC Diagnoses   Final  diagnoses:  History of hypertension  Epigastric pain     Discharge Instructions     Begin taking Dyazide, one-half tab each morning for Blood Pressure.  Monitor blood pressure more frequently at different times of day and record on a calendar.  If abdominal pain recurs, begin taking Prilosec OTC.  If symptoms become significantly worse during the night or over the weekend, proceed to the local emergency room.     ED Prescriptions    None         Kandra Nicolas, MD 07/20/17 1017

## 2017-07-13 NOTE — Discharge Instructions (Signed)
Begin taking Dyazide, one-half tab each morning for Blood Pressure.  Monitor blood pressure more frequently at different times of day and record on a calendar.  If abdominal pain recurs, begin taking Prilosec OTC.  If symptoms become significantly worse during the night or over the weekend, proceed to the local emergency room.

## 2017-07-13 NOTE — ED Triage Notes (Signed)
Headache, elevated BP x 2 days, pain under breasts yesterday

## 2017-07-31 ENCOUNTER — Encounter: Payer: Medicare Other | Admitting: Vascular Surgery

## 2017-08-21 DIAGNOSIS — F319 Bipolar disorder, unspecified: Secondary | ICD-10-CM | POA: Diagnosis not present

## 2017-08-21 DIAGNOSIS — F411 Generalized anxiety disorder: Secondary | ICD-10-CM | POA: Diagnosis not present

## 2017-08-24 ENCOUNTER — Other Ambulatory Visit: Payer: Self-pay

## 2017-08-24 DIAGNOSIS — I714 Abdominal aortic aneurysm, without rupture, unspecified: Secondary | ICD-10-CM

## 2017-09-10 ENCOUNTER — Ambulatory Visit (INDEPENDENT_AMBULATORY_CARE_PROVIDER_SITE_OTHER): Payer: Medicare Other | Admitting: Osteopathic Medicine

## 2017-09-10 ENCOUNTER — Encounter: Payer: Self-pay | Admitting: Osteopathic Medicine

## 2017-09-10 VITALS — BP 136/60 | HR 89 | Temp 97.5°F | Ht 64.17 in | Wt 137.9 lb

## 2017-09-10 DIAGNOSIS — Z0000 Encounter for general adult medical examination without abnormal findings: Secondary | ICD-10-CM | POA: Diagnosis not present

## 2017-09-10 DIAGNOSIS — I714 Abdominal aortic aneurysm, without rupture, unspecified: Secondary | ICD-10-CM

## 2017-09-10 DIAGNOSIS — F3177 Bipolar disorder, in partial remission, most recent episode mixed: Secondary | ICD-10-CM

## 2017-09-10 DIAGNOSIS — Z1211 Encounter for screening for malignant neoplasm of colon: Secondary | ICD-10-CM

## 2017-09-10 NOTE — Progress Notes (Signed)
HPI: Anita Krause is a 77 y.o. female  who presents to [place] today, 09/10/17,  for Medicare Annual Wellness Exam  Patient presents for annual physical/Medicare wellness exam. no complaints today.   Past medical, surgical, social and family history reviewed:  Patient Active Problem List   Diagnosis Date Noted  . 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  . AAA (abdominal aortic aneurysm) without rupture ([place]) 05/03/2017  . Bursitis of hip, right 05/02/2017  . Right shoulder pain 03/14/2017  . Chronic back pain 03/14/2017  . 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 Abdominal aortic atherosclerosis ([place]) 03/08/2017  . Bipolar 1 disorder, mixed, partial remission ([place]) 09/21/2016  . 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 History of hypertension 09/21/2016  . Tobacco abuse disorder 09/21/2016  . Chronic bronchitis ([place]) 09/21/2016  . Functional memory problem 09/21/2016  . Muscle spasm 09/21/2016  . Anxiety 09/21/2016  . Adjustment disorder with disturbance of emotion 08/29/2016    Past Surgical History:  Procedure Laterality Date  . ABDOMINAL HYSTERECTOMY    . ANEURYSM COILING    . BACK SURGERY    . POLYPECTOMY     VOCAL CORD    Social History   Socioeconomic History  . Marital status: Divorced    Spouse name: Not on file  . Number of children: Not on file  . Years of education: Not on file  . Highest education level: Not on file  Occupational History  . Not on file  Social Needs  . Financial resource strain: Not on file  . Food insecurity:    Worry: Not on file    Inability: Not on file  . Transportation needs:    Medical: Not on file    Non-medical: Not on file  Tobacco Use  . Smoking status: Current Some Day Smoker    Types: Cigarettes  . Smokeless tobacco: Never Used  . Tobacco comment: 05/16/17 As per pt, smokes one cig occassionally  Substance and Sexual Activity  . Alcohol use: No    Frequency: Never   . Drug use: No  . Sexual activity: Not on file  Lifestyle  . Physical activity:    Days per week: Not on file    Minutes per session: Not on file  . Stress: Not on file  Relationships  . Social connections:    Talks on phone: Not on file    Gets together: Not on file    Attends religious service: Not on file    Active member of club or organization: Not on file    Attends meetings of clubs or organizations: Not on file    Relationship status: Not on file  . Intimate partner violence:    Fear of current or ex partner: Not on file    Emotionally abused: Not on file    Physically abused: Not on file    Forced sexual activity: Not on file  Other Topics Concern  . Not on file  Social History Narrative  . Not on file    Family History  Problem Relation Age of Onset  . Hyperlipidemia Mother   . Hypertension Mother   . Diabetes Mother   . Hyperlipidemia Father   . Hypertension Father   . Cancer Sister      Current medication list and allergy/intolerance information reviewed:    Outpatient Encounter Medications as of 09/10/2017  Medication Sig  . albuterol (PROVENTIL HFA;VENTOLIN HFA) 108 (90 Base) MCG/ACT inhaler Inhale 1-2 puffs  into the lungs every 4 (four) hours as needed for wheezing or shortness of breath.  Marland Kitchen aspirin EC 81 MG tablet Take 1 tablet (81 mg total) by mouth daily. For prevention of heart attack/stroke  . ciprofloxacin (CIPRO) 500 MG tablet Take 1 tablet (500 mg total) by mouth 2 (two) times daily.  . cyclobenzaprine (FLEXERIL) 5 MG tablet Take 1 tablet (5 mg total) by mouth 3 (three) times daily as needed for muscle spasms.  . divalproex (DEPAKOTE) 250 MG DR tablet TK 3 TS PO HS  . Fluticasone-Umeclidin-Vilant (TRELEGY ELLIPTA) 100-62.5-25 MCG/INH AEPB Inhale 1 Inhaler into the lungs daily. For chronic bronchitis / COPD  . hydrOXYzine (ATARAX/VISTARIL) 25 MG tablet Take 25 mg by mouth 2 (two) times daily as needed for anxiety.  Marland Kitchen levothyroxine (SYNTHROID,  LEVOTHROID) 50 MCG tablet Take 1 tablet (50 mcg total) by mouth daily before breakfast. For low functioning thyroid  . naproxen (NAPROSYN) 500 MG tablet Take 1 tablet (500 mg total) by mouth 2 (two) times daily with a meal. For pain/arthritis  . potassium chloride SA (K-DUR,KLOR-CON) 20 MEQ tablet Take 1 tablet (20 mEq total) by mouth 2 (two) times daily. For low potassium on your high blood pressure medicine  . traZODone (DESYREL) 100 MG tablet TAKE 1 TABLET(100 MG) BY MOUTH AT BEDTIME  . triamterene-hydrochlorothiazide (DYAZIDE) 37.5-25 MG capsule Take 1 each (1 capsule total) by mouth 2 (two) times daily. For high blood pressure   No facility-administered encounter medications on file as of 09/10/2017.     Allergies  Allergen Reactions  . Erythromycin Anaphylaxis    Throat closes up, ling in mouth peals  . Nitrofurantoin Macrocrystal Anaphylaxis    Pt states her throat closes up  . Penicillins Anaphylaxis  . Colesevelam Hcl Other (See Comments)  . Lamotrigine Rash       Review of Systems: Review of Systems - General ROS: negative   Medicare Wellness Questionnaire  Are there smokers in your home (other than you)? no  Depression Screen (Note: if answer to either of the following is "Yes", a more complete depression screening is indicated)   Q1: Over the past two weeks, have you felt down, depressed or hopeless? no  Q2: Over the past two weeks, have you felt little interest or pleasure in doing things? no  Have you lost interest or pleasure in daily life? no  Do you often feel hopeless? no  Do you cry easily over simple problems? no  Activities of Daily Living In your present state of health, do you have any difficulty performing the following activities?:  Driving? no Managing money?  no Feeding yourself? no Getting from bed to chair? no Climbing a flight of stairs? no Preparing food and eating?: no Bathing or showering? no Getting dressed: no Getting to the toilet?  no Using the toilet: no Moving around from place to place: no In the past year have you fallen or had a near fall?: yes  Hearing Difficulties:  Do you often ask people to speak up or repeat themselves? yes Do you experience ringing or noises in your ears? no  Do you have difficulty understanding soft or whispered voices? no  Memory Difficulties:  Do you feel that you have a problem with memory? no  Do you often misplace items? yes  Do you feel safe at home?  yes  Sexual Health:   Are you sexually active?  No  Do you have more than one partner?  No  Advanced Directives:  Advanced directives discussed: has NO advanced directive - not interested in additional information  Additional information provided: no  Risk Factors  Current exercise habits: none  Dietary issues discussed:no concerns  Cardiac risk factors: smoker, hypertension, hypercholesterolemia/hyperlipidemia   Exam:  BP 136/60 (BP Location: Left Arm, Patient Position: Sitting, Cuff Size: Normal)   Pulse 89   Temp (!) 97.5 F (36.4 C) (Oral)   Ht 5' 4.17" (1.63 m)   Wt 137 lb 14.4 oz (62.6 kg)   BMI 23.54 kg/m   Constitutional: VS see above. General Appearance: alert, well-developed, well-nourished, NAD  Ears, Nose, Mouth, Throat: MMM  Neck: No masses, trachea midline.   Respiratory: Normal respiratory effort. no wheeze, no rhonchi, no rales  Cardiovascular:No lower extremity edema.   Musculoskeletal: Gait normal. No clubbing/cyanosis of digits.   Neurological: Normal balance/coordination. No tremor. Recalls 3 objects and able to read face of watch with correct time.   Skin: warm, dry, intact. No rash/ulcer.   Psychiatric: Normal judgment/insight. Normal mood and affect. Oriented x3.     ASSESSMENT/PLAN:   Encounter for Medicare annual wellness exam  Medicare annual wellness visit, subsequent - Plan: CBC, COMPLETE METABOLIC PANEL WITH GFR, TSH, Lipid panel, Ambulatory referral to  Gastroenterology  AAA (abdominal aortic aneurysm) without rupture ([place]) - Plan: CBC, COMPLETE METABOLIC PANEL WITH GFR, TSH, Lipid panel  Bipolar 1 disorder, mixed, partial remission ([place]) - Plan: CBC, COMPLETE METABOLIC PANEL WITH GFR, TSH, Lipid panel  Colon cancer screening - Plan: Ambulatory referral to Gastroenterology  CANCER SCREENING  Lung - USPSTF: 55-80yo w/ 30 py hx unless quit w/in 90yr - does not need  Colon - need records  Breast - needs  Cervical - does not need OTHER DISEASE SCREENING  Lipid - needs  DM2 - needs  AAA - following with vascular surgery  Osteoporosis - needs DEXA INFECTIOUS DISEASE SCREENING  HIV - does not need  GC/CT - does not need  HepC -does not need  TB - does not need ADULT VACCINATION  Influenza - annual vaccine recommended - has declined  Td - booster every 10 years - needs, has declined    Zoster - option at 46, yes at 64+ - needs, has declined  PCV13 - does not need  PPSV23 - does not need Immunization History  Administered Date(s) Administered  . Pneumococcal Polysaccharide-23 05/16/2017   OTHER  Fall - exercise and Vit D age 49+ - needs  Advanced Directives -  Discussed as above   During the course of the visit the patient was educated and counseled about appropriate screening and preventive services as noted above.   Patient Instructions (the written plan) was given to the patient.  Medicare Attestation I have personally reviewed: The patient's medical and social history Their use of alcohol, tobacco or illicit drugs Their current medications and supplements The patient's functional ability including ADLs,fall risks, home safety risks, cognitive, and hearing and visual impairment Diet and physical activities Evidence for depression or mood disorders  The patient's weight, height, BMI, and visual acuity have been recorded in the chart.  I have made referrals, counseling, and provided education to the  patient based on review of the above and I have provided the patient with a written personalized care plan for preventive services.     Emeterio Reeve, DO   09/10/17   Visit summary with medication list and pertinent instructions was printed for patient to review. All questions at time of visit were answered - patient instructed to  contact office with any additional concerns. ER/RTC precautions were reviewed with the patient. Follow-up plan: Return in about 1 year (around 09/11/2018) for annual physical, sooner if needed .

## 2017-09-10 NOTE — Patient Instructions (Signed)
Preventive Care 77 Years and Older, Female Preventive care refers to lifestyle choices and visits with your health care provider that can promote health and wellness. What does preventive care include?  A yearly physical exam. This is also called an annual well check.  Dental exams once or twice a year.  Routine eye exams. Ask your health care provider how often you should have your eyes checked.  Personal lifestyle choices, including: ? Daily care of your teeth and gums. ? Regular physical activity. ? Eating a healthy diet. ? Avoiding tobacco and drug use. ? Limiting alcohol use. ? Practicing safe sex. ? Taking low-dose aspirin every day. ? Taking vitamin and mineral supplements as recommended by your health care provider. What happens during an annual well check? The services and screenings done by your health care provider during your annual well check will depend on your age, overall health, lifestyle risk factors, and family history of disease. Counseling Your health care provider may ask you questions about your:  Alcohol use.  Tobacco use.  Drug use.  Emotional well-being.  Home and relationship well-being.  Sexual activity.  Eating habits.  History of falls.  Memory and ability to understand (cognition).  Work and work environment.  Reproductive health.  Screening You may have the following tests or measurements:  Height, weight, and BMI.  Blood pressure.  Lipid and cholesterol levels. These may be checked every 5 years, or more frequently if you are over 50 years old.  Skin check.  Lung cancer screening. You may have this screening every year starting at age 55 if you have a 30-pack-year history of smoking and currently smoke or have quit within the past 15 years.  Fecal occult blood test (FOBT) of the stool. You may have this test every year starting at age 50.  Flexible sigmoidoscopy or colonoscopy. You may have a sigmoidoscopy every 5 years or  a colonoscopy every 10 years starting at age 50.  Hepatitis C blood test.  Hepatitis B blood test.  Sexually transmitted disease (STD) testing.  Diabetes screening. This is done by checking your blood sugar (glucose) after you have not eaten for a while (fasting). You may have this done every 1-3 years.  Bone density scan. This is done to screen for osteoporosis. You may have this done starting at age 65.  Mammogram. This may be done every 1-2 years. Talk to your health care provider about how often you should have regular mammograms.  Talk with your health care provider about your test results, treatment options, and if necessary, the need for more tests. Vaccines Your health care provider may recommend certain vaccines, such as:  Influenza vaccine. This is recommended every year.  Tetanus, diphtheria, and acellular pertussis (Tdap, Td) vaccine. You may need a Td booster every 10 years.  Varicella vaccine. You may need this if you have not been vaccinated.  Zoster vaccine. You may need this after age 60.  Measles, mumps, and rubella (MMR) vaccine. You may need at least one dose of MMR if you were born in 1957 or later. You may also need a second dose.  Pneumococcal 13-valent conjugate (PCV13) vaccine. One dose is recommended after age 65.  Pneumococcal polysaccharide (PPSV23) vaccine. One dose is recommended after age 65.  Meningococcal vaccine. You may need this if you have certain conditions.  Hepatitis A vaccine. You may need this if you have certain conditions or if you travel or work in places where you may be exposed to hepatitis   A.  Hepatitis B vaccine. You may need this if you have certain conditions or if you travel or work in places where you may be exposed to hepatitis B.  Haemophilus influenzae type b (Hib) vaccine. You may need this if you have certain conditions.  Talk to your health care provider about which screenings and vaccines you need and how often you  need them. This information is not intended to replace advice given to you by your health care provider. Make sure you discuss any questions you have with your health care provider. Document Released: 01/15/2015 Document Revised: 09/08/2015 Document Reviewed: 10/20/2014 Elsevier Interactive Patient Education  2018 Elsevier Inc.  

## 2017-09-11 ENCOUNTER — Encounter: Payer: Self-pay | Admitting: Osteopathic Medicine

## 2017-09-11 DIAGNOSIS — F3177 Bipolar disorder, in partial remission, most recent episode mixed: Secondary | ICD-10-CM | POA: Diagnosis not present

## 2017-09-11 DIAGNOSIS — Z0000 Encounter for general adult medical examination without abnormal findings: Secondary | ICD-10-CM | POA: Diagnosis not present

## 2017-09-11 DIAGNOSIS — I714 Abdominal aortic aneurysm, without rupture: Secondary | ICD-10-CM | POA: Diagnosis not present

## 2017-09-12 LAB — LIPID PANEL
CHOL/HDL RATIO: 5.4 (calc) — AB (ref <5.0)
Cholesterol: 237 mg/dL — ABNORMAL HIGH (ref <200)
HDL: 44 mg/dL — ABNORMAL LOW (ref >50)
LDL Cholesterol (Calc): 142 mg/dL (calc) — ABNORMAL HIGH
NON-HDL CHOLESTEROL (CALC): 193 mg/dL — AB (ref <130)
Triglycerides: 331 mg/dL — ABNORMAL HIGH (ref <150)

## 2017-09-12 LAB — COMPLETE METABOLIC PANEL WITH GFR
AG Ratio: 1.7 (calc) (ref 1.0–2.5)
ALBUMIN MSPROF: 4 g/dL (ref 3.6–5.1)
ALT: 6 U/L (ref 6–29)
AST: 11 U/L (ref 10–35)
Alkaline phosphatase (APISO): 49 U/L (ref 33–130)
BUN: 7 mg/dL (ref 7–25)
CALCIUM: 9.6 mg/dL (ref 8.6–10.4)
CO2: 30 mmol/L (ref 20–32)
CREATININE: 0.93 mg/dL (ref 0.60–0.93)
Chloride: 101 mmol/L (ref 98–110)
GFR, EST AFRICAN AMERICAN: 69 mL/min/{1.73_m2} (ref >=60)
GFR, EST NON AFRICAN AMERICAN: 59 mL/min/{1.73_m2} — AB (ref >=60)
Globulin: 2.3 g/dL (calc) (ref 1.9–3.7)
Glucose, Bld: 95 mg/dL (ref 65–139)
Potassium: 4.1 mmol/L (ref 3.5–5.3)
Sodium: 140 mmol/L (ref 135–146)
TOTAL PROTEIN: 6.3 g/dL (ref 6.1–8.1)
Total Bilirubin: 0.4 mg/dL (ref 0.2–1.2)

## 2017-09-12 LAB — CBC
HEMATOCRIT: 42.4 % (ref 35.0–45.0)
HEMOGLOBIN: 14.3 g/dL (ref 11.7–15.5)
MCH: 29.2 pg (ref 27.0–33.0)
MCHC: 33.7 g/dL (ref 32.0–36.0)
MCV: 86.7 fL (ref 80.0–100.0)
MPV: 10.3 fL (ref 7.5–12.5)
Platelets: 162 10*3/uL (ref 140–400)
RBC: 4.89 10*6/uL (ref 3.80–5.10)
RDW: 14.3 % (ref 11.0–15.0)
WBC: 4.6 10*3/uL (ref 3.8–10.8)

## 2017-09-12 LAB — TSH: TSH: 2.02 mIU/L (ref 0.40–4.50)

## 2017-09-13 ENCOUNTER — Ambulatory Visit (HOSPITAL_COMMUNITY)
Admission: RE | Admit: 2017-09-13 | Discharge: 2017-09-13 | Disposition: A | Discharge status: Home / Self Care | Payer: Medicare Other | Source: Ambulatory Visit | Attending: Vascular Surgery | Admitting: Vascular Surgery

## 2017-09-13 ENCOUNTER — Other Ambulatory Visit (HOSPITAL_COMMUNITY): Payer: Self-pay

## 2017-09-13 DIAGNOSIS — I714 Abdominal aortic aneurysm, without rupture, unspecified: Secondary | ICD-10-CM

## 2017-09-13 DIAGNOSIS — I700 Atherosclerosis of aorta: Secondary | ICD-10-CM | POA: Insufficient documentation

## 2017-09-25 ENCOUNTER — Ambulatory Visit (INDEPENDENT_AMBULATORY_CARE_PROVIDER_SITE_OTHER): Payer: Medicare Other | Admitting: Vascular Surgery

## 2017-09-25 ENCOUNTER — Encounter: Payer: Self-pay | Admitting: Vascular Surgery

## 2017-09-25 VITALS — BP 145/81 | HR 78 | Temp 97.0°F | Resp 16 | Ht 64.0 in | Wt 139.7 lb

## 2017-09-25 DIAGNOSIS — I714 Abdominal aortic aneurysm, without rupture, unspecified: Secondary | ICD-10-CM

## 2017-09-25 NOTE — Progress Notes (Signed)
[facility] [place]  Patient name: Anita Krause MRN: [id number] DOB: 1940-06-08 Sex: female  REASON FOR CONSULT: Evaluation of infrarenal abdominal aortic aneurysm  HPI: Anita Krause is a 77 y.o. female, who is here today for discussion of infrarenal abdominal aortic aneurysm.  She is here today with her sister.  She had discovery of this at the time of lumbar plain films for evaluation of back pain.  Ultrasound at that time suggested aneurysm of approximately 3-1/2 cm.  She had a very [place] [facility] follow-up in May and is now recently seen [place] again in September for repeat ultrasound.  She is here today to establish care for ongoing evaluation of this.  She does have a history of cerebral aneurysm.  Interestingly her sister is a son who recently underwent a sending aortic aneurysm repair and their mother had a cerebral aneurysm bleed.  She has no symptoms referable to her aneurysm and has no history of arterial insufficiency  Past Medical History:  Diagnosis Date  . Bipolar 1 disorder ([place])     Family History  Problem Relation Age of Onset  . Hyperlipidemia Mother   . Hypertension Mother   . Diabetes Mother   . Hyperlipidemia Father   . Hypertension Father   . Cancer Sister     SOCIAL HISTORY: Social History   Socioeconomic History  . Marital status: Divorced    Spouse name: Not on file  . Number of children: Not on file  . Years of education: Not on file  . Highest education level: Not on file  Occupational History  . Not on file  Social Needs  . Financial resource strain: Not on file  . Food insecurity:    Worry: Not on file    Inability: Not on file  . Transportation needs:    Medical: Not on file    Non-medical: Not on file  Tobacco Use  . Smoking status: Former Smoker    Types: Cigarettes  . Smokeless tobacco: Never Used  . Tobacco comment: 09/10/17 - As per pt, no longer smoking  Substance and Sexual  Activity  . Alcohol use: No    Frequency: Never  . Drug use: No  . Sexual activity: Not on file  Lifestyle  . Physical activity:    Days per week: Not on file    Minutes per session: Not on file  . Stress: Not on file  Relationships  . Social connections:    Talks on phone: Not on file    Gets together: Not on file    Attends religious service: Not on file    Active member of club or organization: Not on file    Attends meetings of clubs or organizations: Not on file    Relationship status: Not on file  . Intimate partner violence:    Fear of current or ex partner: Not on file    Emotionally abused: Not on file    Physically abused: Not on file    Forced sexual activity: Not on file  Other Topics Concern  . Not on file  Social History Narrative  . Not on file    Allergies  Allergen Reactions  . Erythromycin Anaphylaxis    Throat closes up, ling in mouth peals  . Nitrofurantoin Macrocrystal Anaphylaxis    Pt states her throat closes up  . Penicillins Anaphylaxis  . Colesevelam Hcl Other (See Comments)  . Lamotrigine Rash    Current Outpatient Medications  Medication  Sig Dispense Refill  . albuterol (PROVENTIL HFA;VENTOLIN HFA) 108 (90 Base) MCG/ACT inhaler Inhale 1-2 puffs into the lungs every 4 (four) hours as needed for wheezing or shortness of breath. 2 Inhaler 11  . divalproex (DEPAKOTE) 250 MG DR tablet TK 3 TS PO HS  0  . Fluticasone-Umeclidin-Vilant (TRELEGY ELLIPTA) 100-62.5-25 MCG/INH AEPB Inhale 1 Inhaler into the lungs daily. For chronic bronchitis / COPD 60 each 11  . hydrOXYzine (ATARAX/VISTARIL) 25 MG tablet Take 25 mg by mouth 2 (two) times daily as needed for anxiety.    . hydrOXYzine (VISTARIL) 25 MG capsule Take by mouth.    . levofloxacin (LEVAQUIN) 500 MG tablet TAKE 1 TABLET BY MOUTH ONCE DAILY FOR 10 DAYS  0  . levothyroxine (SYNTHROID, LEVOTHROID) 50 MCG tablet Take 1 tablet (50 mcg total) by mouth daily before breakfast. For low functioning  thyroid 90 tablet 1  . naproxen (NAPROSYN) 500 MG tablet Take 1 tablet (500 mg total) by mouth 2 (two) times daily with a meal. For pain/arthritis 180 tablet 3  . potassium chloride SA (K-DUR,KLOR-CON) 20 MEQ tablet Take 1 tablet (20 mEq total) by mouth 2 (two) times daily. For low potassium on your high blood pressure medicine 180 tablet 3  . traZODone (DESYREL) 100 MG tablet TAKE 1 TABLET(100 MG) BY MOUTH AT BEDTIME 90 tablet 1  . cyclobenzaprine (FLEXERIL) 5 MG tablet Take 1 tablet (5 mg total) by mouth 3 (three) times daily as needed for muscle spasms. (Patient not taking: Reported on 09/25/2017) 30 tablet 2  . triamterene-hydrochlorothiazide (DYAZIDE) 37.5-25 MG capsule Take 1 each (1 capsule total) by mouth 2 (two) times daily. For high blood pressure (Patient not taking: Reported on 09/25/2017) 180 capsule 1   No current facility-administered medications for this visit.     REVIEW OF SYSTEMS:  [X]  denotes positive finding, [ ]  denotes negative finding Cardiac  Comments:  Chest pain or chest pressure:    Shortness of breath upon exertion: x   Short of breath when lying flat:    Irregular heart rhythm:        Vascular    Pain in calf, thigh, or hip brought on by ambulation:    Pain in feet at night that wakes you up from your sleep:     Blood clot in your veins:    Leg swelling:         Pulmonary    Oxygen at home:    Productive cough:     Wheezing:         Neurologic    Sudden weakness in arms or legs:     Sudden numbness in arms or legs:     Sudden onset of difficulty speaking or slurred speech:    Temporary loss of vision in one eye:     Problems with dizziness:         Gastrointestinal    Blood in stool:     Vomited blood:         Genitourinary    Burning when urinating:     Blood in urine:        Psychiatric    Major depression:         Hematologic    Bleeding problems:    Problems with blood clotting too easily:        Skin    Rashes or ulcers: x         Constitutional    Fever or chills:  PHYSICAL EXAM: Vitals:   09/25/17 1302  BP: (!) 145/81  Pulse: 78  Resp: 16  Temp: (!) 97 F (36.1 C)  TempSrc: Oral  SpO2: 95%  Weight: 139 lb 11.2 oz (63.4 kg)  Height: 5\' 4"  (1.626 m)    GENERAL: The patient is a well-nourished female, in no acute distress. The vital signs are documented above. CARDIOVASCULAR: Carotid arteries without bruits bilaterally.  2+ radial, 2+ femoral and 2+ dorsalis pedis pulses bilaterally.  Palpable popliteal pulses without evidence of aneurysm.  I do not feel an abdominal aortic aneurysm PULMONARY: There is good air exchange  ABDOMEN: Soft and non-tender  MUSCULOSKELETAL: There are no major deformities or cyanosis. NEUROLOGIC: No focal weakness or paresthesias are detected. SKIN: There are no ulcers or rashes noted. PSYCHIATRIC: The patient has a normal affect.  DATA:  Recent duplex revealed maximal diameter of 3.6 cm.  MEDICAL ISSUES: Long discussion with the patient and her sister.  Explained that she has a small aneurysm and that her lifelong risk of rupture is low.  I did explain the importance of ongoing surveillance of this.  I did review both open and endovascular repair should she get to a point where this would be recommended.  I did explain the typical slow growth of aneurysms.  We will see her again in 1 year with repeat ultrasound follow-up   Rosetta Posner, MD [facility] Tel [phone] Pager [phone]

## 2017-10-08 ENCOUNTER — Ambulatory Visit (INDEPENDENT_AMBULATORY_CARE_PROVIDER_SITE_OTHER): Payer: Medicare Other | Admitting: Family Medicine

## 2017-10-08 ENCOUNTER — Encounter: Payer: Self-pay | Admitting: Family Medicine

## 2017-10-08 VITALS — BP 151/73 | HR 96 | Wt 138.0 lb

## 2017-10-08 DIAGNOSIS — M47818 Spondylosis without myelopathy or radiculopathy, sacral and sacrococcygeal region: Secondary | ICD-10-CM

## 2017-10-08 DIAGNOSIS — M79644 Pain in right finger(s): Secondary | ICD-10-CM

## 2017-10-08 DIAGNOSIS — M62838 Other muscle spasm: Secondary | ICD-10-CM

## 2017-10-08 MED ORDER — CYCLOBENZAPRINE HCL 5 MG PO TABS: 5.0000 mg | ORAL_TABLET | Freq: Three times a day (TID) | ORAL | 2 refills | Status: DC | PRN

## 2017-10-08 NOTE — Patient Instructions (Signed)
Thank you for coming in today. Call or go to the ER if you develop a large red swollen joint with extreme pain or oozing puss.  Recheck as needed.  Let me know if you want to re-do physical therapy.

## 2017-10-08 NOTE — Progress Notes (Signed)
Anita Krause is a 77 y.o. female who presents to [facility] today for back pain, thumb pain.   Back pain: she has had a several month history of back pain following a fall in may. She denies shooting or burning pain and says that it does not radiate down her legs. She denies bowel and bladder symptoms. She has pain from her right lateral hip up all the way into the right side of her neck. She takes naproxen that does not provide much relief and she needs a refill of her flexeril. She did a course of physical therapy which helped but says she does not have time to do that again. The pain keeps her up at night and she cannot lay on her right side.   Thumb pain: she has had thumb pain in her IP joint since "chipping a bone" 25 years ago. She has had injections in the thumb in the past that have helped and she is requesting one today.     ROS:  As above  Exam:  BP (!) 151/73   Pulse 96   Wt 138 lb (62.6 kg)   BMI 23.69 kg/m  General: Well Developed, well nourished, and in no acute distress.  Neuro/Psych: Alert and oriented x3, extra-ocular muscles intact, able to move all 4 extremities, sensation grossly intact. Skin: Warm and dry, no rashes noted.  Respiratory: Not using accessory muscles, speaking in full sentences, trachea midline.  Cardiovascular: Pulses palpable, no extremity edema. Abdomen: Does not appear distended. L-spine: normal appearing with no obvious deformities  Non tender to palpation at spinal midline. Tender to palpation at right SI joint  Normal ROM Negative slump test  Right hip: normal appearing with no obvious deformities  Non tender to palpation over the greater trochanter  No pain or weakness with active abduction  Right Thumb: Enlargement of interphalangeal joint.  Tender to palpation at IP joint.  Decreased motion at IP joint.  Normal motion at MCP and CMC.  Procedure: Real-time Ultrasound Guided Injection of  right SI joint  Device: GE Logiq E   Images permanently stored and available for review in the ultrasound unit. Verbal informed consent obtained.  Discussed risks and benefits of procedure. Warned about infection bleeding damage to structures skin hypopigmentation and fat atrophy among others. Patient expresses understanding and agreement Time-out conducted.   Noted no overlying erythema, induration, or other signs of local infection.   Skin prepped in a sterile fashion.   Local anesthesia: Topical Ethyl chloride.   With sterile technique and under real time ultrasound guidance:  40mg  kenalog anf 47ml lidocaine injected easily.   Completed without difficulty   Pain immediately resolved suggesting accurate placement of the medication.   Advised to call if fevers/chills, erythema, induration, drainage, or persistent bleeding.   Images permanently stored and available for review in the ultrasound unit.  Impression: Technically successful ultrasound guided injection.    Procedure: Real-time Ultrasound Guided Injection of right thumb IP joint   Device: GE Logiq E   Images permanently stored and available for review in the ultrasound unit. Verbal informed consent obtained.  Discussed risks and benefits of procedure. Warned about infection bleeding damage to structures skin hypopigmentation and fat atrophy among others. Patient expresses understanding and agreement Time-out conducted.   Noted no overlying erythema, induration, or other signs of local infection.   Skin prepped in a sterile fashion.   Local anesthesia: Topical Ethyl chloride.   With sterile technique  and under real time ultrasound guidance:  10mg  depomedrol and 0.33ml lidocaine injected easily.   Completed without difficulty   Pain immediately resolved suggesting accurate placement of the medication.   Advised to call if fevers/chills, erythema, induration, drainage, or persistent bleeding.   Images permanently stored and  available for review in the ultrasound unit.  Impression: Technically successful ultrasound guided injection.      Lab and Radiology Results EXAM: RIGHT THUMB 2+V  COMPARISON:  None.  FINDINGS: Bandaging is present about the IP joint of the thumb. No acute bony or joint abnormality is seen. The patient has advanced osteoarthritis at the IP joint with bone-on-bone joint space narrowing and bulky osteophytosis. Mild to moderate first CMC osteoarthritis is also noted. Soft tissues are unremarkable.  IMPRESSION: No acute abnormality.  Advanced osteoarthritis IP joint right thumb.   Electronically Signed   By: Inge Rise M.D.   On: 03/08/2017 08:36  EXAM: DG HIP (WITH OR WITHOUT PELVIS) 2-3V RIGHT  COMPARISON:  None.  FINDINGS: The included lumbar spine demonstrates degenerative disc disease with disc space flattening, vacuum disc phenomenon and endplate sclerosis at A1-9 and L5-S1. Facet arthropathy is noted at the lumbosacral junction. The arcuate lines the sacrum appear intact. Mild degenerative spurring about the SI joints bilaterally. The bony pelvis appears intact. There is sclerosis about the pubic symphysis with joint space narrowing and degenerative subcortical cystic change of the left parasymphysis. The femoral heads are maintained within both hip joints with right slightly greater than left joint space narrowing of the right hip relative to left. The pubic rami appear intact. The proximal femora appear intact. Acetabular components are not shallow nor is there overcoverage of the femoral heads. No suspicious osseous lesions. No suspicious soft tissue abnormality.  IMPRESSION: 1. Lower lumbar degenerative disc disease and facet arthropathy from L4 through S1. 2. 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 3. Mild degenerative joint space narrowing of the right hip. No acute osseous  abnormality.   Electronically Signed   By: Ashley Royalty M.D.   On: 04/06/2017 18:04 I personally (independently) visualized and performed the interpretation of the images attached in this note.    Assessment and Plan: 77 y.o. female with back pain, thumb pain.   Pain in L-spine and SI joint.  Discussed options.  Plan for injection today as above.  Additionally will consider physical therapy however patient declined that referral today.  Plan to continue over-the-counter medications.  Limited cyclobenzaprine.  Recheck as needed.  Thumb pain: She has a long history of thumb IP arthritis which has responded well to injections in the past. She received an injection today. She should return to clinic if her pain worsens or does not improve.   She denied the influenza vaccine today.     No orders of the defined types were placed in this encounter.  Meds ordered this encounter  Medications  . cyclobenzaprine (FLEXERIL) 5 MG tablet    Sig: Take 1 tablet (5 mg total) by mouth 3 (three) times daily as needed for muscle spasms.    Dispense:  30 tablet    Refill:  2    Historical information moved to improve visibility of documentation.  Past Medical History:  Diagnosis Date  . AAA (abdominal aortic aneurysm) without rupture ([place]) 05/03/2017   3.4 cm May 2019  . Bipolar 1 disorder ([place])   . 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  . Chronic back pain 03/14/2017  .  Chronic bronchitis ([place]) 09/21/2016   Past Surgical History:  Procedure Laterality Date  . ABDOMINAL HYSTERECTOMY    . ANEURYSM COILING    . BACK SURGERY    . POLYPECTOMY     VOCAL CORD   Social History   Tobacco Use  . Smoking status: Former Smoker    Types: Cigarettes  . Smokeless tobacco: Never Used  . Tobacco comment: 09/10/17 - As per pt, no longer smoking  Substance Use Topics  . Alcohol use: No    Frequency: Never   family history includes Cancer in her sister; Diabetes in her mother; Hyperlipidemia in her father and  mother; Hypertension in her father and mother.  Medications: Current Outpatient Medications  Medication Sig Dispense Refill  . albuterol (PROVENTIL HFA;VENTOLIN HFA) 108 (90 Base) MCG/ACT inhaler Inhale 1-2 puffs into the lungs every 4 (four) hours as needed for wheezing or shortness of breath. 2 Inhaler 11  . cyclobenzaprine (FLEXERIL) 5 MG tablet Take 1 tablet (5 mg total) by mouth 3 (three) times daily as needed for muscle spasms. 30 tablet 2  . divalproex (DEPAKOTE) 250 MG DR tablet TK 3 TS PO HS  0  . Fluticasone-Umeclidin-Vilant (TRELEGY ELLIPTA) 100-62.5-25 MCG/INH AEPB Inhale 1 Inhaler into the lungs daily. For chronic bronchitis / COPD 60 each 11  . hydrOXYzine (ATARAX/VISTARIL) 25 MG tablet Take 25 mg by mouth 2 (two) times daily as needed for anxiety.    . hydrOXYzine (VISTARIL) 25 MG capsule Take by mouth.    . levofloxacin (LEVAQUIN) 500 MG tablet TAKE 1 TABLET BY MOUTH ONCE DAILY FOR 10 DAYS  0  . levothyroxine (SYNTHROID, LEVOTHROID) 50 MCG tablet Take 1 tablet (50 mcg total) by mouth daily before breakfast. For low functioning thyroid 90 tablet 1  . naproxen (NAPROSYN) 500 MG tablet Take 1 tablet (500 mg total) by mouth 2 (two) times daily with a meal. For pain/arthritis 180 tablet 3  . potassium chloride SA (K-DUR,KLOR-CON) 20 MEQ tablet Take 1 tablet (20 mEq total) by mouth 2 (two) times daily. For low potassium on your high blood pressure medicine 180 tablet 3  . traZODone (DESYREL) 100 MG tablet TAKE 1 TABLET(100 MG) BY MOUTH AT BEDTIME 90 tablet 1  . triamterene-hydrochlorothiazide (DYAZIDE) 37.5-25 MG capsule Take 1 each (1 capsule total) by mouth 2 (two) times daily. For high blood pressure 180 capsule 1   No current facility-administered medications for this visit.    Allergies  Allergen Reactions  . Erythromycin Anaphylaxis    Throat closes up, ling in mouth peals  . Nitrofurantoin Macrocrystal Anaphylaxis    Pt states her throat closes up  . Penicillins Anaphylaxis   . Colesevelam Hcl Other (See Comments)  . Lamotrigine Rash      Discussed warning signs or symptoms. Please see discharge instructions. Patient expresses understanding.  I personally was present and performed or re-performed the history, physical exam and medical decision-making activities of this service and have verified that the service and findings are accurately documented in the student's note. ___________________________________________ Lynne Leader M.D., ABFM., CAQSM. Primary Care and Sports Medicine Adjunct Instructor of [place] [employer] [employer]

## 2017-10-09 ENCOUNTER — Encounter: Payer: Self-pay | Admitting: Family Medicine

## 2017-10-09 DIAGNOSIS — M47818 Spondylosis without myelopathy or radiculopathy, sacral and sacrococcygeal region: Secondary | ICD-10-CM | POA: Insufficient documentation

## 2017-10-23 DIAGNOSIS — F411 Generalized anxiety disorder: Secondary | ICD-10-CM | POA: Diagnosis not present

## 2017-10-23 DIAGNOSIS — I10 Essential (primary) hypertension: Secondary | ICD-10-CM | POA: Diagnosis not present

## 2017-10-23 DIAGNOSIS — F5101 Primary insomnia: Secondary | ICD-10-CM | POA: Diagnosis not present

## 2017-10-23 DIAGNOSIS — F319 Bipolar disorder, unspecified: Secondary | ICD-10-CM | POA: Diagnosis not present

## 2017-11-21 DIAGNOSIS — J3089 Other allergic rhinitis: Secondary | ICD-10-CM | POA: Diagnosis not present

## 2017-11-21 DIAGNOSIS — R05 Cough: Secondary | ICD-10-CM | POA: Diagnosis not present

## 2017-11-21 DIAGNOSIS — J441 Chronic obstructive pulmonary disease with (acute) exacerbation: Secondary | ICD-10-CM | POA: Diagnosis not present

## 2017-11-21 DIAGNOSIS — F17200 Nicotine dependence, unspecified, uncomplicated: Secondary | ICD-10-CM | POA: Diagnosis not present

## 2017-11-21 DIAGNOSIS — I10 Essential (primary) hypertension: Secondary | ICD-10-CM | POA: Diagnosis not present

## 2017-12-10 DIAGNOSIS — R05 Cough: Secondary | ICD-10-CM | POA: Diagnosis not present

## 2017-12-10 DIAGNOSIS — J449 Chronic obstructive pulmonary disease, unspecified: Secondary | ICD-10-CM | POA: Diagnosis not present

## 2017-12-10 DIAGNOSIS — Z87891 Personal history of nicotine dependence: Secondary | ICD-10-CM | POA: Diagnosis not present

## 2017-12-10 DIAGNOSIS — I10 Essential (primary) hypertension: Secondary | ICD-10-CM | POA: Diagnosis not present

## 2017-12-10 DIAGNOSIS — R918 Other nonspecific abnormal finding of lung field: Secondary | ICD-10-CM | POA: Diagnosis not present

## 2017-12-10 DIAGNOSIS — F17200 Nicotine dependence, unspecified, uncomplicated: Secondary | ICD-10-CM | POA: Diagnosis not present

## 2017-12-11 DIAGNOSIS — J449 Chronic obstructive pulmonary disease, unspecified: Secondary | ICD-10-CM | POA: Diagnosis not present

## 2017-12-18 ENCOUNTER — Encounter: Payer: Self-pay | Admitting: Osteopathic Medicine

## 2018-01-23 DIAGNOSIS — F411 Generalized anxiety disorder: Secondary | ICD-10-CM | POA: Diagnosis not present

## 2018-01-23 DIAGNOSIS — F319 Bipolar disorder, unspecified: Secondary | ICD-10-CM | POA: Diagnosis not present

## 2018-01-23 DIAGNOSIS — F5101 Primary insomnia: Secondary | ICD-10-CM | POA: Diagnosis not present

## 2018-02-02 ENCOUNTER — Encounter: Payer: Self-pay | Admitting: Family Medicine

## 2018-02-02 ENCOUNTER — Emergency Department (INDEPENDENT_AMBULATORY_CARE_PROVIDER_SITE_OTHER)
Admission: EM | Admit: 2018-02-02 | Discharge: 2018-02-02 | Disposition: A | Discharge status: Home / Self Care | Payer: Medicare Other | Source: Home / Self Care

## 2018-02-02 DIAGNOSIS — R8281 Pyuria: Secondary | ICD-10-CM | POA: Diagnosis not present

## 2018-02-02 DIAGNOSIS — R350 Frequency of micturition: Secondary | ICD-10-CM

## 2018-02-02 DIAGNOSIS — M545 Low back pain, unspecified: Secondary | ICD-10-CM

## 2018-02-02 LAB — POCT URINALYSIS DIP (MANUAL ENTRY)
Bilirubin, UA: NEGATIVE
Blood, UA: NEGATIVE
Glucose, UA: NEGATIVE mg/dL
Ketones, POC UA: NEGATIVE mg/dL
Nitrite, UA: NEGATIVE
Protein Ur, POC: NEGATIVE mg/dL
Spec Grav, UA: 1.02 (ref 1.010–1.025)
Urobilinogen, UA: 0.2 E.U./dL
pH, UA: 6 (ref 5.0–8.0)

## 2018-02-02 MED ORDER — CEPHALEXIN 500 MG PO CAPS: 500.0000 mg | ORAL_CAPSULE | Freq: Four times a day (QID) | ORAL | 0 refills | Status: DC

## 2018-02-02 NOTE — ED Provider Notes (Addendum)
Anita Krause [facility]    CSN: [id number] Arrival date & time: 02/02/18  1103     History   Chief Complaint Chief Complaint  Patient presents with  . Back Pain    HPI Anita Krause is a 78 y.o. female.   78 year old established [facility] urgent care patient who presents with back pain.   She says her symptoms began about a week ago.  She has had no dysuria, frequency or urgency.  Nevertheless she thinks she may have a urinary tract infection.     Past Medical History:  Diagnosis Date  . AAA (abdominal aortic aneurysm) without rupture ([place]) 05/03/2017   3.4 cm May 2019  . Bipolar 1 disorder ([place])   . 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  . Chronic back pain 03/14/2017  . Chronic bronchitis ([place]) 09/21/2016    Patient Active Problem List   Diagnosis Date Noted  . 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  . AAA (abdominal aortic aneurysm) without rupture ([place]) 05/03/2017  . Bursitis of hip, right 05/02/2017  . Chronic back pain 03/14/2017  . 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 Abdominal aortic atherosclerosis ([place]) 03/08/2017  . Bipolar 1 disorder, mixed, partial remission ([place]) 09/21/2016  . 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 History of hypertension 09/21/2016  . Tobacco abuse disorder 09/21/2016  . Chronic bronchitis ([place]) 09/21/2016  . Functional memory problem 09/21/2016  . Muscle spasm 09/21/2016  . Anxiety 09/21/2016  . Adjustment disorder with disturbance of emotion 08/29/2016    Past Surgical History:  Procedure Laterality Date  . ABDOMINAL HYSTERECTOMY    . ANEURYSM COILING    . BACK SURGERY    . POLYPECTOMY     VOCAL CORD    OB History   No obstetric history on file.      Home Medications    Prior to Admission medications   Medication Sig Start Date End Date Taking? Authorizing Provider  albuterol (PROVENTIL HFA;VENTOLIN HFA) 108 (90 Base) MCG/ACT inhaler Inhale 1-2 puffs into the lungs every 4 (four)  hours as needed for wheezing or shortness of breath. 03/07/17   Emeterio Reeve, DO  cephALEXin (KEFLEX) 500 MG capsule Take 1 capsule (500 mg total) by mouth 4 (four) times daily. 02/02/18   Robyn Haber, MD  cyclobenzaprine (FLEXERIL) 5 MG tablet Take 1 tablet (5 mg total) by mouth 3 (three) times daily as needed for muscle spasms. 10/08/17   Gregor Hams, MD  divalproex (DEPAKOTE) 250 MG DR tablet TK 3 TS PO HS 04/20/17   [provider]  Fluticasone-Umeclidin-Vilant (TRELEGY ELLIPTA) 100-62.5-25 MCG/INH AEPB Inhale 1 Inhaler into the lungs daily. For chronic bronchitis / COPD 03/07/17   Emeterio Reeve, DO  hydrOXYzine (ATARAX/VISTARIL) 25 MG tablet Take 25 mg by mouth 2 (two) times daily as needed for anxiety.    [provider]  traZODone (DESYREL) 100 MG tablet TAKE 1 TABLET(100 MG) BY MOUTH AT BEDTIME 10/17/16   Emeterio Reeve, DO  triamterene-hydrochlorothiazide (DYAZIDE) 37.5-25 MG capsule Take 1 each (1 capsule total) by mouth 2 (two) times daily. For high blood pressure 03/07/17   Emeterio Reeve, DO    Family History Family History  Problem Relation Age of Onset  . Hyperlipidemia Mother   . Hypertension Mother   . Diabetes Mother   . Hyperlipidemia Father   . Hypertension Father   . Cancer Sister     Social History Social History   Tobacco Use  . Smoking status: Former  Smoker    Types: Cigarettes  . Smokeless tobacco: Never Used  . Tobacco comment: 09/10/17 - As per pt, no longer smoking  Substance Use Topics  . Alcohol use: No    Frequency: Never  . Drug use: No     Allergies   Erythromycin; Nitrofurantoin macrocrystal; Penicillins; Colesevelam hcl; and Lamotrigine   Review of Systems Review of Systems   Physical Exam Triage Vital Signs ED Triage Vitals  Enc Vitals Group     BP      Pulse      Resp      Temp      Temp src      SpO2      Weight      Height      Head Circumference      Peak Flow      Pain Score      Pain  Loc      Pain Edu?      Excl. in [place]?    No data found.  Updated Vital Signs BP 118/72 (BP Location: Right Arm)   Pulse (!) 103   Temp 98.3 F (36.8 C) (Oral)   Ht 5\' 4"  (1.626 m)   Wt 63.7 kg   SpO2 97%   BMI 24.12 kg/m    Physical Exam Vitals signs and nursing note reviewed.  Constitutional:      Appearance: Normal appearance. She is normal weight.  HENT:     Head: Normocephalic.     Mouth/Throat:     Mouth: Mucous membranes are moist.  Eyes:     Conjunctiva/sclera: Conjunctivae normal.  Neck:     Musculoskeletal: Normal range of motion and neck supple.  Cardiovascular:     Rate and Rhythm: Normal rate and regular rhythm.     Heart sounds: Normal heart sounds.  Pulmonary:     Effort: Pulmonary effort is normal.     Breath sounds: Normal breath sounds.  Abdominal:     Palpations: Abdomen is soft.     Tenderness: There is no abdominal tenderness.  Musculoskeletal: Normal range of motion.  Skin:    General: Skin is warm and dry.  Neurological:     General: No focal deficit present.     Mental Status: She is alert.  Psychiatric:        Mood and Affect: Mood normal.      UC Treatments / Results  Labs (all labs ordered are listed, but only abnormal results are displayed) Labs Reviewed  POCT URINALYSIS DIP (MANUAL ENTRY) - Abnormal; Notable for the following components:      Result Value   Leukocytes, UA Small (1+) (*)    All other components within normal limits  URINE CULTURE    EKG None  Radiology No results found.  Procedures Procedures (including critical care time)  Medications Ordered in UC Medications - No data to display  Initial Impression / Assessment and Plan / UC Course  I have reviewed the triage vital signs and the nursing notes.  Pertinent labs & imaging results that were available during my care of the patient were reviewed by me and considered in my medical decision making (see chart for details).    Final Clinical  Impressions(s) / UC Diagnoses   Final diagnoses:  Urinary frequency  Pyuria  Acute midline low back pain without sciatica     Discharge Instructions     Even though you appear to have a urinary infection, some of that back pain  may be coming from the arthritis in your back.    ED Prescriptions    Medication Sig Dispense Auth. Provider   cephALEXin (KEFLEX) 500 MG capsule Take 1 capsule (500 mg total) by mouth 4 (four) times daily. 20 capsule Robyn Haber, MD     Controlled Substance Prescriptions [place] Controlled Substance Registry consulted? Not Applicable   Robyn Haber, MD 02/02/18 1157    Robyn Haber, MD 02/02/18 [phone]

## 2018-02-02 NOTE — ED Triage Notes (Signed)
Patient c/o low back pain x 1 week, no injury, possible UTI, frequency, no hematuria.

## 2018-02-02 NOTE — Discharge Instructions (Addendum)
Even though you appear to have a urinary infection, some of that back pain may be coming from the arthritis in your back.

## 2018-02-05 ENCOUNTER — Telehealth: Payer: Self-pay

## 2018-02-05 DIAGNOSIS — F17200 Nicotine dependence, unspecified, uncomplicated: Secondary | ICD-10-CM | POA: Diagnosis not present

## 2018-02-05 DIAGNOSIS — R918 Other nonspecific abnormal finding of lung field: Secondary | ICD-10-CM | POA: Diagnosis not present

## 2018-02-05 LAB — URINE CULTURE
MICRO NUMBER:: 139514
SPECIMEN QUALITY:: ADEQUATE

## 2018-02-05 NOTE — Telephone Encounter (Signed)
Pt given UCX results. Advised to continue and finish entire antibiotics. Give [place] a call with any questions.

## 2018-02-14 DIAGNOSIS — H2513 Age-related nuclear cataract, bilateral: Secondary | ICD-10-CM | POA: Diagnosis not present

## 2018-02-14 DIAGNOSIS — H5703 Miosis: Secondary | ICD-10-CM | POA: Diagnosis not present

## 2018-02-14 DIAGNOSIS — H43813 Vitreous degeneration, bilateral: Secondary | ICD-10-CM | POA: Diagnosis not present

## 2018-02-15 DIAGNOSIS — J441 Chronic obstructive pulmonary disease with (acute) exacerbation: Secondary | ICD-10-CM | POA: Diagnosis not present

## 2018-02-15 DIAGNOSIS — R05 Cough: Secondary | ICD-10-CM | POA: Diagnosis not present

## 2018-02-21 DIAGNOSIS — R05 Cough: Secondary | ICD-10-CM | POA: Diagnosis not present

## 2018-02-21 DIAGNOSIS — J441 Chronic obstructive pulmonary disease with (acute) exacerbation: Secondary | ICD-10-CM | POA: Diagnosis not present

## 2018-02-21 DIAGNOSIS — R918 Other nonspecific abnormal finding of lung field: Secondary | ICD-10-CM | POA: Diagnosis not present

## 2018-02-21 DIAGNOSIS — Z87891 Personal history of nicotine dependence: Secondary | ICD-10-CM | POA: Diagnosis not present

## 2018-03-07 DIAGNOSIS — L2084 Intrinsic (allergic) eczema: Secondary | ICD-10-CM | POA: Diagnosis not present

## 2018-03-07 DIAGNOSIS — L820 Inflamed seborrheic keratosis: Secondary | ICD-10-CM | POA: Diagnosis not present

## 2018-05-20 DIAGNOSIS — H0102A Squamous blepharitis right eye, upper and lower eyelids: Secondary | ICD-10-CM | POA: Diagnosis not present

## 2018-05-20 DIAGNOSIS — H1045 Other chronic allergic conjunctivitis: Secondary | ICD-10-CM | POA: Diagnosis not present

## 2018-05-20 DIAGNOSIS — H0102B Squamous blepharitis left eye, upper and lower eyelids: Secondary | ICD-10-CM | POA: Diagnosis not present

## 2018-05-20 DIAGNOSIS — H04123 Dry eye syndrome of bilateral lacrimal glands: Secondary | ICD-10-CM | POA: Diagnosis not present

## 2018-05-30 DIAGNOSIS — F319 Bipolar disorder, unspecified: Secondary | ICD-10-CM | POA: Diagnosis not present

## 2018-05-30 DIAGNOSIS — F5101 Primary insomnia: Secondary | ICD-10-CM | POA: Diagnosis not present

## 2018-05-30 DIAGNOSIS — F17200 Nicotine dependence, unspecified, uncomplicated: Secondary | ICD-10-CM | POA: Diagnosis not present

## 2018-05-30 DIAGNOSIS — F411 Generalized anxiety disorder: Secondary | ICD-10-CM | POA: Diagnosis not present

## 2018-05-30 DIAGNOSIS — Z9114 Patient's other noncompliance with medication regimen: Secondary | ICD-10-CM | POA: Diagnosis not present

## 2018-05-30 DIAGNOSIS — Z79899 Other long term (current) drug therapy: Secondary | ICD-10-CM | POA: Diagnosis not present

## 2018-06-03 DIAGNOSIS — J441 Chronic obstructive pulmonary disease with (acute) exacerbation: Secondary | ICD-10-CM | POA: Diagnosis not present

## 2018-06-03 DIAGNOSIS — J3089 Other allergic rhinitis: Secondary | ICD-10-CM | POA: Diagnosis not present

## 2018-06-03 DIAGNOSIS — F17200 Nicotine dependence, unspecified, uncomplicated: Secondary | ICD-10-CM | POA: Diagnosis not present

## 2018-06-03 DIAGNOSIS — R918 Other nonspecific abnormal finding of lung field: Secondary | ICD-10-CM | POA: Diagnosis not present

## 2018-06-17 DIAGNOSIS — H5703 Miosis: Secondary | ICD-10-CM | POA: Diagnosis not present

## 2018-06-17 DIAGNOSIS — H25812 Combined forms of age-related cataract, left eye: Secondary | ICD-10-CM | POA: Diagnosis not present

## 2018-06-17 DIAGNOSIS — H2512 Age-related nuclear cataract, left eye: Secondary | ICD-10-CM | POA: Diagnosis not present

## 2018-06-17 DIAGNOSIS — H25012 Cortical age-related cataract, left eye: Secondary | ICD-10-CM | POA: Diagnosis not present

## 2018-06-23 ENCOUNTER — Other Ambulatory Visit: Payer: Self-pay | Admitting: Family Medicine

## 2018-06-28 ENCOUNTER — Other Ambulatory Visit: Payer: Self-pay

## 2018-06-28 ENCOUNTER — Emergency Department (HOSPITAL_COMMUNITY)
Admission: EM | Admit: 2018-06-28 | Discharge: 2018-06-28 | Disposition: A | Discharge status: Home / Self Care | Payer: Medicare Other | Attending: Emergency Medicine | Admitting: Emergency Medicine

## 2018-06-28 ENCOUNTER — Emergency Department (HOSPITAL_COMMUNITY): Payer: Medicare Other

## 2018-06-28 ENCOUNTER — Emergency Department (INDEPENDENT_AMBULATORY_CARE_PROVIDER_SITE_OTHER)
Admission: EM | Admit: 2018-06-28 | Discharge: 2018-06-28 | Disposition: A | Discharge status: Home / Self Care | Payer: Medicare Other | Source: Home / Self Care

## 2018-06-28 ENCOUNTER — Encounter (HOSPITAL_COMMUNITY): Payer: Self-pay | Admitting: Emergency Medicine

## 2018-06-28 DIAGNOSIS — R0602 Shortness of breath: Secondary | ICD-10-CM

## 2018-06-28 DIAGNOSIS — Z20828 Contact with and (suspected) exposure to other viral communicable diseases: Secondary | ICD-10-CM | POA: Diagnosis not present

## 2018-06-28 DIAGNOSIS — Z79899 Other long term (current) drug therapy: Secondary | ICD-10-CM | POA: Diagnosis not present

## 2018-06-28 DIAGNOSIS — Z87891 Personal history of nicotine dependence: Secondary | ICD-10-CM | POA: Insufficient documentation

## 2018-06-28 DIAGNOSIS — J441 Chronic obstructive pulmonary disease with (acute) exacerbation: Secondary | ICD-10-CM | POA: Insufficient documentation

## 2018-06-28 DIAGNOSIS — R531 Weakness: Secondary | ICD-10-CM

## 2018-06-28 DIAGNOSIS — R432 Parageusia: Secondary | ICD-10-CM

## 2018-06-28 LAB — CBC
HCT: 49.8 % — ABNORMAL HIGH (ref 36.0–46.0)
Hemoglobin: 16.2 g/dL — ABNORMAL HIGH (ref 12.0–15.0)
MCH: 28.4 pg (ref 26.0–34.0)
MCHC: 32.5 g/dL (ref 30.0–36.0)
MCV: 87.4 fL (ref 80.0–100.0)
Platelets: 181 10*3/uL (ref 150–400)
RBC: 5.7 MIL/uL — ABNORMAL HIGH (ref 3.87–5.11)
RDW: 14.4 % (ref 11.5–15.5)
WBC: 7.4 10*3/uL (ref 4.0–10.5)
nRBC: 0 % (ref 0.0–0.2)

## 2018-06-28 LAB — BASIC METABOLIC PANEL
Anion gap: 12 (ref 5–15)
BUN: 11 mg/dL (ref 8–23)
CO2: 27 mmol/L (ref 22–32)
Calcium: 9.7 mg/dL (ref 8.9–10.3)
Chloride: 96 mmol/L — ABNORMAL LOW (ref 98–111)
Creatinine, Ser: 1.02 mg/dL — ABNORMAL HIGH (ref 0.44–1.00)
GFR calc Af Amer: >60 mL/min (ref >60)
GFR calc non Af Amer: 53 mL/min — ABNORMAL LOW (ref >60)
Glucose, Bld: 138 mg/dL — ABNORMAL HIGH (ref 70–99)
Potassium: 3.7 mmol/L (ref 3.5–5.1)
Sodium: 135 mmol/L (ref 135–145)

## 2018-06-28 LAB — TROPONIN I (HIGH SENSITIVITY)
Troponin I (High Sensitivity): 5 ng/L (ref <18)
Troponin I (High Sensitivity): 4 ng/L (ref <18)

## 2018-06-28 MED ORDER — BENZONATATE 100 MG PO CAPS
200.0000 mg | ORAL_CAPSULE | Freq: Once | ORAL | Status: AC
  Administered 2018-06-28: 200 mg via ORAL
  Filled 2018-06-28: qty 2

## 2018-06-28 MED ORDER — AEROCHAMBER PLUS FLO-VU MISC
1.0000 | Freq: Once | Status: DC
  Filled 2018-06-28: qty 1

## 2018-06-28 MED ORDER — LEVOFLOXACIN 500 MG PO TABS: 500.0000 mg | ORAL_TABLET | Freq: Every day | ORAL | 0 refills | Status: DC

## 2018-06-28 MED ORDER — ALBUTEROL SULFATE HFA 108 (90 BASE) MCG/ACT IN AERS
6.0000 | INHALATION_SPRAY | Freq: Once | RESPIRATORY_TRACT | Status: AC
  Administered 2018-06-28: 6 via RESPIRATORY_TRACT
  Filled 2018-06-28: qty 6.7

## 2018-06-28 MED ORDER — PREDNISONE 10 MG PO TABS: ORAL_TABLET | ORAL | 0 refills | Status: AC

## 2018-06-28 MED ORDER — ALBUTEROL SULFATE (5 MG/ML) 0.5% IN NEBU: 2.5000 mg | INHALATION_SOLUTION | Freq: Four times a day (QID) | RESPIRATORY_TRACT | 1 refills | Status: DC | PRN

## 2018-06-28 MED ORDER — SODIUM CHLORIDE 0.9% FLUSH: 3.0000 mL | Freq: Once | INTRAVENOUS | Status: DC

## 2018-06-28 NOTE — ED Provider Notes (Signed)
[name]    CSN: [id number] Arrival date & time: 06/28/18  1603     History   Chief Complaint Chief Complaint  Patient presents with  . Cough    HPI FRANCOISE CHOJNOWSKI is a 78 y.o. female.   HPI  ANGELETTA GOELZ is a 78 y.o. female presenting to UC with c/o persistent, gradually worsening over the last 2-3 weeks despite completing a course of Levaquin and prednisone prescribed by her pulmonologist. Pt reports worsening SOB, dry mouth and loss of taste and smell.  Generalized weakness. Denies fever or chills. No known sick contacts. Pt does have a hx of COPD and smokes cigarettes daily. Pt is concerned she needs oxygen and IV fluids.    Past Medical History:  Diagnosis Date  . AAA (abdominal aortic aneurysm) without rupture ([place]) 05/03/2017   3.4 cm May 2019  . Bipolar 1 disorder ([place])   . 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  . Chronic back pain 03/14/2017  . Chronic bronchitis ([place]) 09/21/2016    Patient Active Problem List   Diagnosis Date Noted  . 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  . AAA (abdominal aortic aneurysm) without rupture ([place]) 05/03/2017  . Bursitis of hip, right 05/02/2017  . Chronic back pain 03/14/2017  . 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 Abdominal aortic atherosclerosis ([place]) 03/08/2017  . Bipolar 1 disorder, mixed, partial remission ([place]) 09/21/2016  . 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 History of hypertension 09/21/2016  . Tobacco abuse disorder 09/21/2016  . Chronic bronchitis ([place]) 09/21/2016  . Functional memory problem 09/21/2016  . Muscle spasm 09/21/2016  . Anxiety 09/21/2016  . Adjustment disorder with disturbance of emotion 08/29/2016    Past Surgical History:  Procedure Laterality Date  . ABDOMINAL HYSTERECTOMY    . ANEURYSM COILING    . BACK SURGERY    . POLYPECTOMY     VOCAL CORD    OB History   No obstetric history on file.      Home Medications    Prior to Admission  medications   Medication Sig Start Date End Date Taking? Authorizing Provider  albuterol (PROVENTIL HFA;VENTOLIN HFA) 108 (90 Base) MCG/ACT inhaler Inhale 1-2 puffs into the lungs every 4 (four) hours as needed for wheezing or shortness of breath. 03/07/17   Emeterio Reeve, DO  cephALEXin (KEFLEX) 500 MG capsule Take 1 capsule (500 mg total) by mouth 4 (four) times daily. 02/02/18   Robyn Haber, MD  cyclobenzaprine (FLEXERIL) 5 MG tablet Take 1 tablet (5 mg total) by mouth 3 (three) times daily as needed for muscle spasms. 10/08/17   Gregor Hams, MD  divalproex (DEPAKOTE) 250 MG DR tablet TK 3 TS PO HS 04/20/17   [provider]  Fluticasone-Umeclidin-Vilant (TRELEGY ELLIPTA) 100-62.5-25 MCG/INH AEPB Inhale 1 Inhaler into the lungs daily. For chronic bronchitis / COPD 03/07/17   Emeterio Reeve, DO  hydrOXYzine (ATARAX/VISTARIL) 25 MG tablet Take 25 mg by mouth 2 (two) times daily as needed for anxiety.    [provider]  traZODone (DESYREL) 100 MG tablet TAKE 1 TABLET(100 MG) BY MOUTH AT BEDTIME 10/17/16   Emeterio Reeve, DO  triamterene-hydrochlorothiazide (DYAZIDE) 37.5-25 MG capsule Take 1 each (1 capsule total) by mouth 2 (two) times daily. For high blood pressure 03/07/17   Emeterio Reeve, DO    Family History Family History  Problem Relation Age of Onset  . Hyperlipidemia Mother   . Hypertension Mother   .  Diabetes Mother   . Hyperlipidemia Father   . Hypertension Father   . Cancer Sister     Social History Social History   Tobacco Use  . Smoking status: Former Smoker    Types: Cigarettes  . Smokeless tobacco: Never Used  . Tobacco comment: 09/10/17 - As per pt, no longer smoking  Substance Use Topics  . Alcohol use: No    Frequency: Never  . Drug use: No     Allergies   Erythromycin, Nitrofurantoin macrocrystal, Penicillins, Colesevelam hcl, and Lamotrigine   Review of Systems Review of Systems  Constitutional: Positive for fatigue.  Negative for chills and fever.  HENT: Negative for congestion, ear pain, sore throat, trouble swallowing and voice change.   Respiratory: Positive for cough, chest tightness and shortness of breath.   Cardiovascular: Negative for chest pain and palpitations.  Gastrointestinal: Negative for abdominal pain, diarrhea, nausea and vomiting.  Musculoskeletal: Negative for arthralgias, back pain and myalgias.  Skin: Negative for rash.  Neurological: Positive for weakness. Negative for dizziness, light-headedness and headaches.     Physical Exam  No data found.  Updated Vital Signs BP 112/80 (BP Location: Right Arm)   Pulse (!) 105   Temp 98 F (36.7 C) (Oral)   Resp 20   SpO2 93%     Physical Exam Vitals signs and nursing note reviewed.  Constitutional:      Appearance: Normal appearance. She is well-developed.  HENT:     Head: Normocephalic and atraumatic.     Right Ear: Tympanic membrane normal.     Left Ear: Tympanic membrane normal.     Nose: Nose normal.     Right Sinus: No maxillary sinus tenderness or frontal sinus tenderness.     Left Sinus: No maxillary sinus tenderness or frontal sinus tenderness.     Mouth/Throat:     Lips: Pink.     Mouth: Mucous membranes are dry.     Pharynx: Oropharynx is clear. Uvula midline.  Neck:     Musculoskeletal: Normal range of motion.  Cardiovascular:     Rate and Rhythm: Regular rhythm. Tachycardia present.     Comments: Mild tachycardia Pulmonary:     Effort: Pulmonary effort is normal. No respiratory distress.     Breath sounds: Examination of the right-lower field reveals decreased breath sounds, rhonchi and rales. Decreased breath sounds, wheezing (faint, diffuse ), rhonchi and rales present.  Musculoskeletal: Normal range of motion.  Skin:    General: Skin is warm and dry.     Capillary Refill: Capillary refill takes less than 2 seconds.  Neurological:     Mental Status: She is alert and oriented to person, place, and time.   Psychiatric:        Mood and Affect: Mood normal.        Behavior: Behavior normal.      UC Treatments / Results  Labs (all labs ordered are listed, but only abnormal results are displayed) Labs Reviewed - No data to display  EKG None  Radiology No results found.  Procedures Procedures (including critical care time)  Medications Ordered in UC Medications - No data to display  Initial Impression / Assessment and Plan / UC Course  I have reviewed the triage vital signs and the nursing notes.  Pertinent labs & imaging results that were available during my care of the patient were reviewed by me and considered in my medical decision making (see chart for details).     Pt's symptoms concerning  for Covid-19. Pt is at increased risk for complications given age and hx of COPD. Pt has already failed out patient treatment of suspected pneumonia with Levaquin and prednisone prescribed by her pulmonologist.  Pt declined EMS transport, pt's daughter is here to transport her. Pt agreeable to go to the emergency department for further evaluation and treatment. Pt stable for transport via POV.    Final Clinical Impressions(s) / UC Diagnoses   Final diagnoses:  Shortness of breath  Weakness  Loss of taste     Discharge Instructions      Your symptoms are highly concerning for the new virus Covid-19, which can cause severe lung issues, especially given your underlying COPD. It is recommended that you go to the hospital today for further evaluation and treatment.     ED Prescriptions    None     Controlled Substance Prescriptions [place] Controlled Substance Registry consulted? Not Applicable   Tyrell Antonio 06/28/18 1718

## 2018-06-28 NOTE — Discharge Instructions (Signed)
°  Your symptoms are highly concerning for the new virus Covid-19, which can cause severe lung issues, especially given your underlying COPD. It is recommended that you go to the hospital today for further evaluation and treatment.

## 2018-06-28 NOTE — ED Triage Notes (Signed)
Patient sent over from [facility] for further evaluation of shortness of breath x 1 month - sore throat, congestion. Possible L lower lobe pneumonia, per patient's daughter. Patient is A&O x 4, resp e/u, skin w/d. She denies chest pain, dizziness, fevers/chills.

## 2018-06-28 NOTE — Discharge Instructions (Signed)
We saw you in the ER for your breathing related complains. We gave you some breathing treatments in the ER, and seems like your symptoms have improved. Please take albuterol as needed every 4 hours. Please take the medications prescribed. Please refrain from smoking or smoke exposure. Please see a primary care doctor in 1 week. Return to the ER if your symptoms worsen.

## 2018-06-28 NOTE — ED Triage Notes (Signed)
Pt states that she has been sick since Christmas.  Last 2 -3 weeks has had a cough, sore throat nasal congestion, can not taste.

## 2018-06-29 NOTE — ED Provider Notes (Signed)
[name] Provider Note   CSN: [id number] Arrival date & time: 06/28/18  1717     History   Chief Complaint Chief Complaint  Patient presents with  . Shortness of Breath    HPI Anita Krause is a 78 y.o. female.     HPI  78 year old female comes in a chief complaint of shortness of breath and wheezing.  Patient has history of AAA and is an active smoker, smoking about 1 to 2 packs a day.  She reports that she started having cough about 2 weeks ago.  Over time she has had progressively increased wheezing and shortness of breath.  She was unable to sleep well last night which prompted her to go to the ER.  She reports that her PCP has checked her for COVID-19, and the results were negative but she is convinced she has COVID-19 given that she also had a sore throat at one point.  At the moment she is not having any fevers, chills. She was seen in urgent care and was advised to come to the ER for further evaluation.  Past Medical History:  Diagnosis Date  . AAA (abdominal aortic aneurysm) without rupture ([place]) 05/03/2017   3.4 cm May 2019  . Bipolar 1 disorder ([place])   . 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  . Chronic back pain 03/14/2017  . Chronic bronchitis ([place]) 09/21/2016    Patient Active Problem List   Diagnosis Date Noted  . 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  . AAA (abdominal aortic aneurysm) without rupture ([place]) 05/03/2017  . Bursitis of hip, right 05/02/2017  . Chronic back pain 03/14/2017  . 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 Abdominal aortic atherosclerosis ([place]) 03/08/2017  . Bipolar 1 disorder, mixed, partial remission ([place]) 09/21/2016  . 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 History of hypertension 09/21/2016  . Tobacco abuse disorder 09/21/2016  . Chronic bronchitis ([place]) 09/21/2016  . Functional memory problem 09/21/2016  . Muscle spasm 09/21/2016  . Anxiety 09/21/2016  .  Adjustment disorder with disturbance of emotion 08/29/2016    Past Surgical History:  Procedure Laterality Date  . ABDOMINAL HYSTERECTOMY    . ANEURYSM COILING    . BACK SURGERY    . POLYPECTOMY     VOCAL CORD     OB History   No obstetric history on file.      Home Medications    Prior to Admission medications   Medication Sig Start Date End Date Taking? Authorizing Provider  albuterol (PROVENTIL HFA;VENTOLIN HFA) 108 (90 Base) MCG/ACT inhaler Inhale 1-2 puffs into the lungs every 4 (four) hours as needed for wheezing or shortness of breath. 03/07/17   Emeterio Reeve, DO  albuterol (PROVENTIL) (5 MG/ML) 0.5% nebulizer solution Take 0.5 mLs (2.5 mg total) by nebulization every 6 (six) hours as needed for wheezing or shortness of breath. 06/28/18   Varney Biles, MD  cephALEXin (KEFLEX) 500 MG capsule Take 1 capsule (500 mg total) by mouth 4 (four) times daily. 02/02/18   Robyn Haber, MD  cyclobenzaprine (FLEXERIL) 5 MG tablet Take 1 tablet (5 mg total) by mouth 3 (three) times daily as needed for muscle spasms. 10/08/17   Gregor Hams, MD  divalproex (DEPAKOTE) 250 MG DR tablet TK 3 TS PO HS 04/20/17   [provider]  Fluticasone-Umeclidin-Vilant (TRELEGY ELLIPTA) 100-62.5-25 MCG/INH AEPB Inhale 1 Inhaler into the lungs daily. For chronic bronchitis / COPD 03/07/17  Emeterio Reeve, DO  hydrOXYzine (ATARAX/VISTARIL) 25 MG tablet Take 25 mg by mouth 2 (two) times daily as needed for anxiety.    [provider]  levofloxacin (LEVAQUIN) 500 MG tablet Take 1 tablet (500 mg total) by mouth daily. 06/28/18   Varney Biles, MD  predniSONE (DELTASONE) 10 MG tablet Take 4 tablets (40 mg total) by mouth daily for 1 day, THEN 3 tablets (30 mg total) daily for 1 day, THEN 2 tablets (20 mg total) daily for 2 days, THEN 1 tablet (10 mg total) daily for 2 days. 06/28/18 07/04/18  Varney Biles, MD  traZODone (DESYREL) 100 MG tablet TAKE 1 TABLET(100 MG) BY MOUTH AT BEDTIME  10/17/16   Emeterio Reeve, DO  triamterene-hydrochlorothiazide (DYAZIDE) 37.5-25 MG capsule Take 1 each (1 capsule total) by mouth 2 (two) times daily. For high blood pressure 03/07/17   Emeterio Reeve, DO    Family History Family History  Problem Relation Age of Onset  . Hyperlipidemia Mother   . Hypertension Mother   . Diabetes Mother   . Hyperlipidemia Father   . Hypertension Father   . Cancer Sister     Social History Social History   Tobacco Use  . Smoking status: Former Smoker    Types: Cigarettes  . Smokeless tobacco: Never Used  . Tobacco comment: 09/10/17 - As per pt, no longer smoking  Substance Use Topics  . Alcohol use: No    Frequency: Never  . Drug use: No     Allergies   Erythromycin, Nitrofurantoin macrocrystal, Penicillins, Colesevelam hcl, and Lamotrigine   Review of Systems Review of Systems  Constitutional: Positive for activity change.  Respiratory: Positive for cough, shortness of breath and wheezing.   Gastrointestinal: Negative for nausea and vomiting.  All other systems reviewed and are negative.    Physical Exam Updated Vital Signs BP (!) 156/82 (BP Location: Right Arm)   Pulse 84   Temp 98.3 F (36.8 C) (Oral)   Resp 15   SpO2 96%   Physical Exam Vitals signs and nursing note reviewed.  Constitutional:      Appearance: She is well-developed.  HENT:     Head: Normocephalic and atraumatic.  Neck:     Musculoskeletal: Normal range of motion and neck supple.  Cardiovascular:     Rate and Rhythm: Normal rate.  Pulmonary:     Effort: Pulmonary effort is normal.     Breath sounds: Examination of the right-upper field reveals wheezing. Examination of the left-upper field reveals wheezing. Examination of the right-middle field reveals wheezing. Examination of the left-middle field reveals wheezing. Examination of the right-lower field reveals wheezing. Examination of the left-lower field reveals wheezing. Wheezing present.   Abdominal:     General: Bowel sounds are normal.  Skin:    General: Skin is warm and dry.  Neurological:     Mental Status: She is alert and oriented to person, place, and time.      ED Treatments / Results  Labs (all labs ordered are listed, but only abnormal results are displayed) Labs Reviewed  BASIC METABOLIC PANEL - Abnormal; Notable for the following components:      Result Value   Chloride 96 (*)    Glucose, Bld 138 (*)    Creatinine, Ser 1.02 (*)    GFR calc non Af Amer 53 (*)    All other components within normal limits  CBC - Abnormal; Notable for the following components:   RBC 5.70 (*)    Hemoglobin  16.2 (*)    HCT 49.8 (*)    All other components within normal limits  NOVEL CORONAVIRUS, NAA (HOSPITAL ORDER, SEND-OUT TO REF LAB)  TROPONIN I (HIGH SENSITIVITY)  TROPONIN I (HIGH SENSITIVITY)    EKG EKG Interpretation  Date/Time:  Friday June 28 2018 21:05:14 EDT Ventricular Rate:  86 PR Interval:    QRS Duration: 98 QT Interval:  361 QTC Calculation: 432 R Axis:   48 Text Interpretation:  Sinus rhythm Biatrial enlargement Low voltage, precordial leads Borderline T abnormalities, anterior leads No acute changes No significant change since last tracing Confirmed by Varney Biles [phone]) on 06/28/2018 9:22:22 PM Also confirmed by Varney Biles [phone]), editor Hattie Perch [phone])  on 06/29/2018 8:55:18 AM   Radiology Dg Chest 2 View  Result Date: 06/28/2018 CLINICAL DATA:  Shortness of breath EXAM: CHEST - 2 VIEW COMPARISON:  03/07/2017, 11/24/2016 FINDINGS: No consolidation or effusion. Hazy interstitial lung base opacities, no change from priors and felt chronic. Stable cardiomediastinal silhouette with aortic atherosclerosis. No pneumothorax. IMPRESSION: No active cardiopulmonary disease. No significant change from prior radiographs. Electronically Signed   By: Donavan Foil M.D.   On: 06/28/2018 19:03    Procedures .Critical Care Performed by:  Varney Biles, MD Authorized by: Varney Biles, MD   Critical care provider statement:    Critical care time (minutes):  32   Critical care was necessary to treat or prevent imminent or life-threatening deterioration of the following conditions:  Respiratory failure   Critical care was time spent personally by me on the following activities:  Discussions with consultants, evaluation of patient's response to treatment, examination of patient, ordering and performing treatments and interventions, ordering and review of laboratory studies, ordering and review of radiographic studies, pulse oximetry, re-evaluation of patient's condition, obtaining history from patient or surrogate and review of old charts   (including critical care time)  Medications Ordered in ED Medications  albuterol (VENTOLIN HFA) 108 (90 Base) MCG/ACT inhaler 6 puff (6 puffs Inhalation Given 06/28/18 2143)  benzonatate (TESSALON) capsule 200 mg (200 mg Oral Given 06/28/18 2144)     Initial Impression / Assessment and Plan / ED Course  I have reviewed the triage vital signs and the nursing notes.  Pertinent labs & imaging results that were available during my care of the patient were reviewed by me and considered in my medical decision making (see chart for details).        78 year old woman with history of COPD comes in a chief complaint of shortness of breath and wheezing.  She has a cough that is mainly dry.  Her symptoms have been present for 2 weeks.  She reports that her shortness of breath has gotten worse.  Patient was given multiple albuterol inhaler puffs in the ED.  She feels better.  Ambulatory pulse ox was normal.  Chest x-ray does not reveal any evidence of pneumonia.  White count is normal.  With her generalized wheezing and active smoking along with cough that is new, I discussed with her that it would be worthwhile for her to go home with doxycycline.  Patient states that doxycycline never works for  her and that her doctor always gives her Levaquin.  I also discussed with her azithromycin as an option, but she also declined that.  Ultimately she has agreed to taking Levaquin if she does not feel well overnight after the aggressive treatment in the ED.  She understands that Levaquin and any antibiotics carries risk, and that Levaquin probably  carries more risk than doxycycline.  Daughter, who is RN is at the bedside, agrees with my assessment and tried to reinforce the idea of using the antibiotic only if needed.  Patient was reassessed prior to discharge.  She was breathing quite comfortably and was able to ambulate without any problems.  Smoking cessation instruction/counseling given:  counseled patient on the dangers of tobacco use, advised patient to stop smoking, and reviewed strategies to maximize success. Discussion 3-5 min.   Final Clinical Impressions(s) / ED Diagnoses   Final diagnoses:  COPD exacerbation [facility])    ED Discharge Orders         Ordered    levofloxacin (LEVAQUIN) 500 MG tablet  Daily     06/28/18 2237    predniSONE (DELTASONE) 10 MG tablet     06/28/18 2237    albuterol (PROVENTIL) (5 MG/ML) 0.5% nebulizer solution  Every 6 hours PRN     06/28/18 2238           Varney Biles, MD 06/29/18 1542

## 2018-06-30 ENCOUNTER — Telehealth: Payer: Self-pay | Admitting: Emergency Medicine

## 2018-06-30 NOTE — Telephone Encounter (Signed)
Patient states that she is feeling much better after going to the hospital.  She is waiting on her COVID results from her hospital visit.  Will follow up as needed.

## 2018-07-01 DIAGNOSIS — H25011 Cortical age-related cataract, right eye: Secondary | ICD-10-CM | POA: Diagnosis not present

## 2018-07-01 DIAGNOSIS — H2181 Floppy iris syndrome: Secondary | ICD-10-CM | POA: Diagnosis not present

## 2018-07-01 DIAGNOSIS — H25811 Combined forms of age-related cataract, right eye: Secondary | ICD-10-CM | POA: Diagnosis not present

## 2018-07-01 DIAGNOSIS — H2511 Age-related nuclear cataract, right eye: Secondary | ICD-10-CM | POA: Diagnosis not present

## 2018-07-01 DIAGNOSIS — H5703 Miosis: Secondary | ICD-10-CM | POA: Diagnosis not present

## 2018-07-03 LAB — NOVEL CORONAVIRUS, NAA (HOSP ORDER, SEND-OUT TO REF LAB; TAT 18-24 HRS): SARS-CoV-2, NAA: NOT DETECTED

## 2018-07-10 DIAGNOSIS — F5101 Primary insomnia: Secondary | ICD-10-CM | POA: Diagnosis not present

## 2018-07-10 DIAGNOSIS — F319 Bipolar disorder, unspecified: Secondary | ICD-10-CM | POA: Diagnosis not present

## 2018-07-10 DIAGNOSIS — F411 Generalized anxiety disorder: Secondary | ICD-10-CM | POA: Diagnosis not present

## 2018-07-31 ENCOUNTER — Other Ambulatory Visit: Payer: Self-pay

## 2018-08-01 DIAGNOSIS — J3089 Other allergic rhinitis: Secondary | ICD-10-CM | POA: Diagnosis not present

## 2018-08-01 DIAGNOSIS — J441 Chronic obstructive pulmonary disease with (acute) exacerbation: Secondary | ICD-10-CM | POA: Diagnosis not present

## 2018-08-12 DIAGNOSIS — R918 Other nonspecific abnormal finding of lung field: Secondary | ICD-10-CM | POA: Diagnosis not present

## 2018-08-12 DIAGNOSIS — J449 Chronic obstructive pulmonary disease, unspecified: Secondary | ICD-10-CM | POA: Diagnosis not present

## 2018-08-12 DIAGNOSIS — F17210 Nicotine dependence, cigarettes, uncomplicated: Secondary | ICD-10-CM | POA: Diagnosis not present

## 2018-08-12 DIAGNOSIS — R0683 Snoring: Secondary | ICD-10-CM | POA: Diagnosis not present

## 2018-08-13 ENCOUNTER — Encounter: Payer: Self-pay | Admitting: Family Medicine

## 2018-08-13 ENCOUNTER — Other Ambulatory Visit: Payer: Self-pay

## 2018-08-13 ENCOUNTER — Ambulatory Visit (INDEPENDENT_AMBULATORY_CARE_PROVIDER_SITE_OTHER): Payer: Medicare Other | Admitting: Family Medicine

## 2018-08-13 VITALS — BP 122/72 | HR 82 | Temp 97.9°F | Wt 143.0 lb

## 2018-08-13 DIAGNOSIS — M898X8 Other specified disorders of bone, other site: Secondary | ICD-10-CM | POA: Diagnosis not present

## 2018-08-13 DIAGNOSIS — M47818 Spondylosis without myelopathy or radiculopathy, sacral and sacrococcygeal region: Secondary | ICD-10-CM

## 2018-08-13 DIAGNOSIS — M5412 Radiculopathy, cervical region: Secondary | ICD-10-CM

## 2018-08-13 MED ORDER — PREGABALIN 75 MG PO CAPS: 75.0000 mg | ORAL_CAPSULE | Freq: Two times a day (BID) | ORAL | 3 refills | Status: DC

## 2018-08-13 NOTE — Patient Instructions (Addendum)
Thank you for coming in today. Call or go to the ER if you develop a large red swollen joint with extreme pain or oozing puss.  Add lyrica for pinched nerve.  You should hear about MRI soon to plan for injection. Recheck after MRI is done to discuss results and plan for injection.  That can be done over the phone.   Keep me updated.   Pregabalin capsules What is this medicine? PREGABALIN (pre GAB a lin) is used to treat nerve pain from diabetes, shingles, spinal cord injury, and fibromyalgia. It is also used to control seizures in epilepsy. This medicine may be used for other purposes; ask your health care provider or pharmacist if you have questions. COMMON BRAND NAME(S): Lyrica What should I tell my health care provider before I take this medicine? They need to know if you have any of these conditions:  heart disease  history of drug abuse or alcohol abuse problem  kidney disease  lung or breathing disease  suicidal thoughts, plans, or attempt; a previous suicide attempt by you or a family member  an unusual or allergic reaction to pregabalin, gabapentin, other medicines, foods, dyes, or preservatives  pregnant or trying to get pregnant  breast-feeding How should I use this medicine? Take this medicine by mouth with a glass of water. Follow the directions on the prescription label. You can take it with or without food. If it upsets your stomach, take it with food. Take your medicine at regular intervals. Do not take it more often than directed. Do not stop taking except on your doctor's advice. A special MedGuide will be given to you by the pharmacist with each prescription and refill. Be sure to read this information carefully each time. Talk to your pediatrician regarding the use of this medicine in children. While this drug may be prescribed for children as young as 1 month for selected conditions, precautions do apply. Overdosage: If you think you have taken too much of this  medicine contact a poison control center or emergency room at once. NOTE: This medicine is only for you. Do not share this medicine with others. What if I miss a dose? If you miss a dose, take it as soon as you can. If it is almost time for your next dose, take only that dose. Do not take double or extra doses. What may interact with this medicine? This medicine may interact with the following medications:  alcohol  antihistamines for allergy, cough, and cold  certain medicines for anxiety or sleep  certain medicines for depression like amitriptyline, fluoxetine, sertraline  certain medicines for diabetes  certain medicines for seizures like phenobarbital, primidone  general anesthetics like halothane, isoflurane, methoxyflurane, propofol  local anesthetics like lidocaine, pramoxine, tetracaine  medicines that relax muscles for surgery  narcotic medicines for pain  phenothiazines like chlorpromazine, mesoridazine, prochlorperazine, thioridazine This list may not describe all possible interactions. Give your health care provider a list of all the medicines, herbs, non-prescription drugs, or dietary supplements you use. Also tell them if you smoke, drink alcohol, or use illegal drugs. Some items may interact with your medicine. What should I watch for while using this medicine? Tell your doctor or healthcare professional if your symptoms do not start to get better or if they get worse. Visit your doctor or health care professional for regular checks on your progress. Do not stop taking except on your doctor's advice. You may develop a severe reaction. Your doctor will tell you how  much medicine to take. Wear a medical identification bracelet or chain if you are taking this medicine for seizures, and carry a card that describes your disease and details of your medicine and dosage times. You may get drowsy or dizzy. Do not drive, use machinery, or do anything that needs mental alertness  until you know how this medicine affects you. Do not stand or sit up quickly, especially if you are an older patient. This reduces the risk of dizzy or fainting spells. Alcohol may interfere with the effect of this medicine. Avoid alcoholic drinks. If you have a heart condition, like congestive heart failure, and notice that you are retaining water and have swelling in your hands or feet, contact your health care provider immediately. The use of this medicine may increase the chance of suicidal thoughts or actions. Pay special attention to how you are responding while on this medicine. Any worsening of mood, or thoughts of suicide or dying should be reported to your health care professional right away. This medicine has caused reduced sperm counts in some men. This may interfere with the ability to father a child. You should talk to your doctor or health care professional if you are concerned about your fertility. Women who become pregnant while using this medicine for seizures may enroll in the [place] Pregnancy Registry by calling [phone]. This registry collects information about the safety of antiepileptic drug use during pregnancy. What side effects may I notice from receiving this medicine? Side effects that you should report to your doctor or health care professional as soon as possible:  allergic reactions like skin rash, itching or hives, swelling of the face, lips, or tongue  breathing problems  changes in vision  chest pain  confusion  jerking or unusual movements of any part of your body  loss of memory  muscle pain, tenderness, or weakness  suicidal thoughts or other mood changes  swelling of the ankles, feet, hands  unusual bruising or bleeding Side effects that usually do not require medical attention (report to your doctor or health care professional if they continue or are bothersome):  dizziness  drowsiness  dry  mouth  headache  nausea  tremors  trouble sleeping  weight gain This list may not describe all possible side effects. Call your doctor for medical advice about side effects. You may report side effects to FDA at 1-800-FDA-1088. Where should I keep my medicine? Keep out of the reach of children. This medicine can be abused. Keep your medicine in a safe place to protect it from theft. Do not share this medicine with anyone. Selling or giving away this medicine is dangerous and against the law. This medicine may cause accidental overdose and death if it taken by other adults, children, or pets. Mix any unused medicine with a substance like cat litter or coffee grounds. Then throw the medicine away in a sealed container like a sealed bag or a coffee can with a lid. Do not use the medicine after the expiration date. Store at room temperature between 15 and 30 degrees C (59 and 86 degrees F). NOTE: This sheet is a summary. It may not cover all possible information. If you have questions about this medicine, talk to your doctor, pharmacist, or health care provider.  2020 Elsevier/Gold Standard (2017-12-21 13:15:55)

## 2018-08-13 NOTE — Progress Notes (Signed)
Anita Krause is a 78 y.o. female who presents to [facility] today for right cervical radicular pain and right lateral hip iliac crest pain.  Patient has had pain in her right neck radiating down her right arm for the last few weeks.  She recently had a course of prednisone for a bronchitis episode which actually helped her arm pain some.  The prednisone ended and her pain is starting to return a bit.  She notes the pain radiates to her forearm but not to her hand.  She notes the pain is not affected much by shoulder motion but is affected by neck motion.  She notes that when she lays on her side with a pillow that does not work quite right she has worsening arm pain.  She denies weakness or numbness.  She notes that she has history of cervical disc disease in the past.  She is had injections in her neck in the past which have had some benefit in the past.  Symptoms ongoing now about 6 weeks. She has tried gabapentin in the past for similar issues with no benefit.  Additionally she notes pain in her right low back lateral hip or iliac crest region.  This is been ongoing for a few weeks to a few months as well.  She notes she denies any radiating pain down her legs weakness or numbness distally.  She denies any injury.  No fevers or chills nausea vomiting or diarrhea.     ROS:  As above  Exam:  BP 122/72    Pulse 82    Temp 97.9 F (36.6 C) (Oral)    Wt 143 lb (64.9 kg)    BMI 24.55 kg/m  Wt Readings from Last 5 Encounters:  08/13/18 143 lb (64.9 kg)  02/02/18 140 lb 8 oz (63.7 kg)  10/08/17 138 lb (62.6 kg)  09/25/17 139 lb 11.2 oz (63.4 kg)  09/10/17 137 lb 14.4 oz (62.6 kg)   General: Well Developed, well nourished, and in no acute distress.  Neuro/Psych: Alert and oriented x3, extra-ocular muscles intact, able to move all 4 extremities, sensation grossly intact. Skin: Warm and dry, no rashes noted.  Respiratory: Not using accessory  muscles, speaking in full sentences, trachea midline.  Cardiovascular: Pulses palpable, no extremity edema. Abdomen: Does not appear distended. MSK:  C-spine: Nontender to cervical midline.  Decreased cervical motion positive Spurling's test right arm.  Upper semi-strength reflexes and sensation are equal normal throughout. L-spine: Nontender to spinal midline.  Tender to palpation along lumbar paraspinal musculature.  Not tender to palpation on sacrum either. Right hip: Normal motion.  Tender palpation along posterior aspect of iliac crest.  Not particularly tender to palpation at greater trochanter.  Hip abduction strength is intact. Right shoulder normal-appearing nontender normal motion normal strength.  Negative impingement testing.    Lab and Radiology Results Procedure: Real-time Ultrasound Guided Injection of area of maximum tenderness at gluteus medius origin posterior aspect of right iliac crest Device: GE Logiq E   Images permanently stored and available for review in the ultrasound unit. Verbal informed consent obtained.  Discussed risks and benefits of procedure. Warned about infection bleeding damage to structures skin hypopigmentation and fat atrophy among others. Patient expresses understanding and agreement Time-out conducted.   Noted no overlying erythema, induration, or other signs of local infection.   Skin prepped in a sterile fashion.   Local anesthesia: Topical Ethyl chloride.   With sterile technique and  under real time ultrasound guidance:  40 mg of Kenalog and 2 mL of Marcaine injected easily.   Completed without difficulty   Pain immediately resolved suggesting accurate placement of the medication.   Advised to call if fevers/chills, erythema, induration, drainage, or persistent bleeding.   Images permanently stored and available for review in the ultrasound unit.  Impression: Technically successful ultrasound guided injection.   EXAM: CERVICAL SPINE -  COMPLETE 4+ VIEW  COMPARISON:  None in PACs  FINDINGS: There is mild reversal of the normal cervical lordosis. The vertebral bodies are preserved in height. There is moderate disc space narrowing at C4-5 with milder narrowing at C5-6 and at C6-7. There are large anterior bridging osteophytes at C4-5, C5-6, and C6-7. There is no perched facet or spinous process fracture. The oblique views are limited in positioning. There is some bony encroachment upon the neural foramina bilaterally at multiple levels. The odontoid is intact. The prevertebral soft tissue spaces are normal.  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 Multilevel bony encroachment upon the neural foramina due to facet joint and uncovertebral joint hypertrophy. Reversal of the normal cervical lordosis consistent with muscle spasm.   Electronically Signed   By: David  Martinique M.D.   On: 05/03/2017 08:16 I personally (independently) visualized and performed the interpretation of the images attached in this note.       Assessment and Plan: 78 y.o. female with right arm pain very likely cervical radiculopathy likely at C5.  Symptoms have been ongoing now for over 6 weeks and failing typical conservative management.  She has significant degenerative changes in this region on x-ray previously.  At this point she is already had trials of steroids which provided temporary benefit.  We will try Lyrica which may help.  She is had trials of gabapentin in the past with similar symptoms with little benefit.  However we will proceed to MRI of cervical spine to further characterize neuroforaminal stenosis or compression for epidural steroid injection planning.  Additionally patient has pain in her right iliac crest.  This was injected today and had significant symptom improvement.  Work on hip abduction strengthening and stretching.  If symptoms return or not improving will proceed with further advanced imaging  of this area.   PDMP not reviewed this encounter. Orders Placed This Encounter  Procedures   MR Cervical Spine Wo Contrast    Standing Status:   Future    Standing Expiration Date:   10/13/2019    Order Specific Question:   What is the patient's sedation requirement?    Answer:   No Sedation    Order Specific Question:   Does the patient have a pacemaker or implanted devices?    Answer:   No    Order Specific Question:   Preferred imaging location?    Answer:   Product[facility]process development scientist (table limit-350lbs)    Order Specific Question:   Radiology Contrast Protocol - do NOT remove file path    Answer:   [email or web address]   Meds ordered this encounter  Medications   pregabalin (LYRICA) 75 MG capsule    Sig: Take 1 capsule (75 mg total) by mouth 2 (two) times daily.    Dispense:  60 capsule    Refill:  3    Historical information moved to improve visibility of documentation.  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   Chronic back pain 03/14/2017   Chronic bronchitis ([facility]) 09/21/2016   Past Surgical History:  Procedure Laterality Date   ABDOMINAL HYSTERECTOMY     ANEURYSM COILING     BACK SURGERY     POLYPECTOMY     VOCAL CORD   Social History   Tobacco Use   Smoking status: Former Smoker    Types: Cigarettes   Smokeless tobacco: Never Used   Tobacco comment: 09/10/17 - As per pt, no longer smoking  Substance Use Topics   Alcohol use: No    Frequency: Never   family history includes Cancer in her sister; Diabetes in her mother; Hyperlipidemia in her father and mother; Hypertension in her father and mother.  Medications: Current Outpatient Medications  Medication Sig Dispense Refill   albuterol (PROVENTIL HFA;VENTOLIN HFA) 108 (90 Base) MCG/ACT inhaler Inhale 1-2 puffs into the lungs every 4  (four) hours as needed for wheezing or shortness of breath. 2 Inhaler 11   albuterol (PROVENTIL) (5 MG/ML) 0.5% nebulizer solution Take 0.5 mLs (2.5 mg total) by nebulization every 6 (six) hours as needed for wheezing or shortness of breath. 20 mL 1   cyclobenzaprine (FLEXERIL) 5 MG tablet Take 1 tablet (5 mg total) by mouth 3 (three) times daily as needed for muscle spasms. 30 tablet 2   FLUoxetine (PROZAC) 20 MG capsule      QUEtiapine (SEROQUEL) 100 MG tablet Take by mouth.     divalproex (DEPAKOTE) 250 MG DR tablet TK 3 TS PO HS  0   Fluticasone-Umeclidin-Vilant (TRELEGY ELLIPTA) 100-62.5-25 MCG/INH AEPB Inhale 1 Inhaler into the lungs daily. For chronic bronchitis / COPD (Patient not taking: Reported on 08/13/2018) 60 each 11   hydrOXYzine (ATARAX/VISTARIL) 25 MG tablet Take 25 mg by mouth 2 (two) times daily as needed for anxiety.     pregabalin (LYRICA) 75 MG capsule Take 1 capsule (75 mg total) by mouth 2 (two) times daily. 60 capsule 3   traZODone (DESYREL) 100 MG tablet TAKE 1 TABLET(100 MG) BY MOUTH AT BEDTIME (Patient not taking: Reported on 08/13/2018) 90 tablet 1   triamterene-hydrochlorothiazide (DYAZIDE) 37.5-25 MG capsule Take 1 each (1 capsule total) by mouth 2 (two) times daily. For high blood pressure 180 capsule 1   No current facility-administered medications for this visit.    Allergies  Allergen Reactions   Erythromycin Anaphylaxis    Throat closes up, ling in mouth peals   Nitrofurantoin Macrocrystal Anaphylaxis    Pt states her throat closes up   Penicillins Anaphylaxis   Colesevelam Hcl Other (See Comments)   Lamotrigine Rash      Discussed warning signs or symptoms. Please see discharge instructions. Patient expresses understanding.

## 2018-08-18 ENCOUNTER — Ambulatory Visit (INDEPENDENT_AMBULATORY_CARE_PROVIDER_SITE_OTHER): Payer: Medicare Other

## 2018-08-18 ENCOUNTER — Other Ambulatory Visit: Payer: Self-pay

## 2018-08-18 DIAGNOSIS — M5412 Radiculopathy, cervical region: Secondary | ICD-10-CM | POA: Diagnosis not present

## 2018-08-18 DIAGNOSIS — M542 Cervicalgia: Secondary | ICD-10-CM | POA: Diagnosis not present

## 2018-08-19 DIAGNOSIS — G4710 Hypersomnia, unspecified: Secondary | ICD-10-CM | POA: Diagnosis not present

## 2018-08-19 DIAGNOSIS — R0683 Snoring: Secondary | ICD-10-CM | POA: Diagnosis not present

## 2018-08-21 ENCOUNTER — Telehealth: Payer: Self-pay | Admitting: Family Medicine

## 2018-08-21 ENCOUNTER — Telehealth: Payer: Self-pay | Admitting: Osteopathic Medicine

## 2018-08-21 DIAGNOSIS — M5412 Radiculopathy, cervical region: Secondary | ICD-10-CM

## 2018-08-21 NOTE — Telephone Encounter (Signed)
-----   Message from Joanne Chars, LPN sent at 0/12/2239 11:06 AM EDT ----- Called patient and notified of results. SHe states she would like to do the epidural injection. Please order. KG LPN

## 2018-08-21 NOTE — Telephone Encounter (Signed)
Pt called. She is allergic to the Pregabalin(wants this documented).  Wants another script called in.  Thanks.

## 2018-08-21 NOTE — Telephone Encounter (Signed)
Patient was  Informed to stop taking the Lyrica , she stated that she stopped taking it a week ago. She stated that she used to take [place]  Im sure I spelled it wrong.( arthritis medication) which has helped and she would like for that to be called in. Please advise. Rhonda Cunningham,CMA

## 2018-08-21 NOTE — Telephone Encounter (Signed)
Called and spoke with the patient.  She is aware of referral being placed and if she has not gotten a call within a week to call us back.

## 2018-08-21 NOTE — Telephone Encounter (Signed)
Stop Lyrica.  There is no obvious next step medication here.  Recommend discuss this further with Dr. Sheppard Coil on your visit the 24th.  We can discuss it more if needed as well.

## 2018-08-21 NOTE — Telephone Encounter (Signed)
I have placed a referral to Dr. Francesco Runner here in [place] to proceed with the injections.  You should get a call from his office soon.

## 2018-08-22 DIAGNOSIS — G4710 Hypersomnia, unspecified: Secondary | ICD-10-CM | POA: Diagnosis not present

## 2018-08-22 DIAGNOSIS — R0683 Snoring: Secondary | ICD-10-CM | POA: Diagnosis not present

## 2018-08-22 MED ORDER — NAPROXEN 500 MG PO TABS: 500.0000 mg | ORAL_TABLET | Freq: Two times a day (BID) | ORAL | 1 refills | Status: DC | PRN

## 2018-08-22 NOTE — Telephone Encounter (Signed)
Naproxen sent to [facility].  Take twice daily as needed for pain.  Use sparingly.

## 2018-08-22 NOTE — Telephone Encounter (Signed)
Patient has been advised. Anita Krause,CMA

## 2018-08-26 ENCOUNTER — Other Ambulatory Visit: Payer: Self-pay

## 2018-08-26 ENCOUNTER — Ambulatory Visit (INDEPENDENT_AMBULATORY_CARE_PROVIDER_SITE_OTHER): Payer: Medicare Other | Admitting: Osteopathic Medicine

## 2018-08-26 ENCOUNTER — Encounter: Payer: Self-pay | Admitting: Osteopathic Medicine

## 2018-08-26 ENCOUNTER — Encounter: Payer: Medicare Other | Admitting: Osteopathic Medicine

## 2018-08-26 VITALS — BP 151/68 | HR 97 | Wt 137.4 lb

## 2018-08-26 DIAGNOSIS — Z2821 Immunization not carried out because of patient refusal: Secondary | ICD-10-CM | POA: Diagnosis not present

## 2018-08-26 DIAGNOSIS — F3177 Bipolar disorder, in partial remission, most recent episode mixed: Secondary | ICD-10-CM

## 2018-08-26 DIAGNOSIS — I10 Essential (primary) hypertension: Secondary | ICD-10-CM | POA: Diagnosis not present

## 2018-08-26 DIAGNOSIS — R7309 Other abnormal glucose: Secondary | ICD-10-CM | POA: Diagnosis not present

## 2018-08-26 DIAGNOSIS — F5105 Insomnia due to other mental disorder: Secondary | ICD-10-CM

## 2018-08-26 DIAGNOSIS — R682 Dry mouth, unspecified: Secondary | ICD-10-CM | POA: Diagnosis not present

## 2018-08-26 DIAGNOSIS — Z23 Encounter for immunization: Secondary | ICD-10-CM | POA: Diagnosis not present

## 2018-08-26 DIAGNOSIS — K219 Gastro-esophageal reflux disease without esophagitis: Secondary | ICD-10-CM | POA: Diagnosis not present

## 2018-08-26 DIAGNOSIS — Z8639 Personal history of other endocrine, nutritional and metabolic disease: Secondary | ICD-10-CM | POA: Diagnosis not present

## 2018-08-26 DIAGNOSIS — F99 Mental disorder, not otherwise specified: Secondary | ICD-10-CM

## 2018-08-26 DIAGNOSIS — Z8709 Personal history of other diseases of the respiratory system: Secondary | ICD-10-CM

## 2018-08-26 DIAGNOSIS — Z87898 Personal history of other specified conditions: Secondary | ICD-10-CM

## 2018-08-26 MED ORDER — OMEPRAZOLE 40 MG PO CPDR: 40.0000 mg | DELAYED_RELEASE_CAPSULE | Freq: Every day | ORAL | 0 refills | Status: DC

## 2018-08-26 MED ORDER — TETANUS-DIPHTH-ACELL PERTUSSIS 5-2-15.5 LF-MCG/0.5 IM SUSP: 0.5000 mL | Freq: Once | INTRAMUSCULAR | 0 refills | Status: AC

## 2018-08-26 MED ORDER — SHINGRIX 50 MCG/0.5ML IM SUSR: 0.5000 mL | Freq: Once | INTRAMUSCULAR | 1 refills | Status: AC

## 2018-08-26 NOTE — Patient Instructions (Addendum)
Please discuss your sleep medication with your psychiatrist, they may be able to try something that will alleviate your side effects.  If you are still having concerns about heartburn even with stopping the medicines, please give me a call.  General Preventive Care  Most recent routine screening lipids/other labs: ordered today  Tobacco: don't!  Alcohol: don't!   Exercise: as tolerated to reduce risk of cardiovascular disease and diabetes. Strength training will also prevent osteoporosis.   Mental health: if need for mental health care (medicines, counseling, other), or concerns about moods, please discuss with your psychiatrist or with me!  Vaccines  Flu vaccine: recommended for almost everyone, every fall.   Shingles vaccine: Shingrix recommended after age 76. Medicare will not pay for this in the office, please discuss with your pharmacist!   Pneumonia vaccine: done!   Tetanus booster: Tdap recommended every 10 years. Medicare will not pay for this in the office, please discuss with your pharmacist!  Cancer screenings   Colon cancer screening: recommended for everyone at age 57-75  Breast cancer screening: mammogram optional after age 34  Cervical cancer screening: Pap typically discontinued after age 40 as long as previous results were normal.  Lung cancer screening: CT chest to continue per pulmonology.  Infection screenings  . HIV, Gonorrhea/Chlamydia: screening as needed. . Hepatitis C: recommended for anyone born 60-1965 . TB: certain at-risk populations, or depending on work requirements and/or travel history Other . Bone Density Test: recommended for women at age 36 . Abdominal Aortic Aneurysm: follow-up annually in May

## 2018-08-26 NOTE — Progress Notes (Signed)
HPI: Anita Krause is a 78 y.o. female who  has a past medical history of AAA (abdominal aortic aneurysm) without rupture ([place]) (05/03/2017), Bipolar 1 disorder ([place]), Brain aneurysm (07/02/2017), Chronic back pain (03/14/2017), and Chronic bronchitis ([place]) (09/21/2016).  she presents to [facility] today, 08/26/18,  for chief complaint of: Yearly check-up Concern for GERD, insomnia    Mental health: Patient has a history of bipolar disorder and generalized anxiety disorder as well as insomnia.  She has been following with psychiatry, same physician for about 3 years, she is a bit cranky about this provider, they seem to be switching medications but she does not feel like she is doing much better.  She states that the Seroquel she was recently started on has caused some issues with GERD.  He is not really sleeping any better either.  Hypertension: Patient is checking her blood pressure at home, she reports it was systolic 017P this morning, she is not sure why it is high now.  She states that she only really takes her blood pressure medication when her blood pressure is "high" that she does not tell me any specific parameters at which she decides to take the medication or not.  Cough: Patient thinks that she had coronavirus last December 2019, this was not known to be in the [place] at that time, she is convinced that she and her daughter had it.  She would like antibody testing for this.  Patient declined flu vaccine. She is due for tetanus vaccine and shingles vaccine, printed prescriptions for this Declines colonoscopy Declines mammogram Declines bone density test  Patient also requests urinalysis today for dry mouth      Past medical, surgical, social and family history reviewed:  Patient Active Problem List   Diagnosis Date Noted  . 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  . AAA (abdominal aortic aneurysm) without  rupture ([place]) 05/03/2017  . Bursitis of hip, right 05/02/2017  . Chronic back pain 03/14/2017  . 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 Abdominal aortic atherosclerosis ([place]) 03/08/2017  . Bipolar 1 disorder, mixed, partial remission ([place]) 09/21/2016  . 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 History of hypertension 09/21/2016  . Tobacco abuse disorder 09/21/2016  . Chronic bronchitis ([place]) 09/21/2016  . Functional memory problem 09/21/2016  . Muscle spasm 09/21/2016  . Anxiety 09/21/2016  . Adjustment disorder with disturbance of emotion 08/29/2016    Past Surgical History:  Procedure Laterality Date  . ABDOMINAL HYSTERECTOMY    . ANEURYSM COILING    . BACK SURGERY    . POLYPECTOMY     VOCAL CORD    Social History   Tobacco Use  . Smoking status: Former Smoker    Types: Cigarettes  . Smokeless tobacco: Never Used  . Tobacco comment: 09/10/17 - As per pt, no longer smoking  Substance Use Topics  . Alcohol use: No    Frequency: Never    Family History  Problem Relation Age of Onset  . Hyperlipidemia Mother   . Hypertension Mother   . Diabetes Mother   . Hyperlipidemia Father   . Hypertension Father   . Cancer Sister      Current medication list and allergy/intolerance information reviewed:    Current Outpatient Medications  Medication Sig Dispense Refill  . albuterol (PROVENTIL HFA;VENTOLIN HFA) 108 (90 Base) MCG/ACT inhaler Inhale 1-2 puffs into the lungs every 4 (four) hours as needed for wheezing  or shortness of breath. 2 Inhaler 11  . albuterol (PROVENTIL) (5 MG/ML) 0.5% nebulizer solution Take 0.5 mLs (2.5 mg total) by nebulization every 6 (six) hours as needed for wheezing or shortness of breath. 20 mL 1  . cyclobenzaprine (FLEXERIL) 5 MG tablet Take 1 tablet (5 mg total) by mouth 3 (three) times daily as needed for muscle spasms. 30 tablet 2  . divalproex (DEPAKOTE) 250 MG DR tablet TK 3 TS PO HS  0  . FLUoxetine (PROZAC) 20 MG capsule      . Fluticasone-Umeclidin-Vilant (TRELEGY ELLIPTA) 100-62.5-25 MCG/INH AEPB Inhale 1 Inhaler into the lungs daily. For chronic bronchitis / COPD (Patient not taking: Reported on 08/13/2018) 60 each 11  . hydrOXYzine (ATARAX/VISTARIL) 25 MG tablet Take 25 mg by mouth 2 (two) times daily as needed for anxiety.    . naproxen (NAPROSYN) 500 MG tablet Take 1 tablet (500 mg total) by mouth 2 (two) times daily as needed for moderate pain. 60 tablet 1  . pregabalin (LYRICA) 75 MG capsule Take 1 capsule (75 mg total) by mouth 2 (two) times daily. 60 capsule 3  . QUEtiapine (SEROQUEL) 100 MG tablet Take by mouth.    . traZODone (DESYREL) 100 MG tablet TAKE 1 TABLET(100 MG) BY MOUTH AT BEDTIME (Patient not taking: Reported on 08/13/2018) 90 tablet 1  . triamterene-hydrochlorothiazide (DYAZIDE) 37.5-25 MG capsule Take 1 each (1 capsule total) by mouth 2 (two) times daily. For high blood pressure 180 capsule 1   No current facility-administered medications for this visit.     Allergies  Allergen Reactions  . Erythromycin Anaphylaxis    Throat closes up, ling in mouth peals  . Nitrofurantoin Macrocrystal Anaphylaxis    Pt states her throat closes up  . Penicillins Anaphylaxis  . Colesevelam Hcl Other (See Comments)  . Lamotrigine Rash      Review of Systems:  Constitutional:  No  fever, no chills, No recent illness  HEENT: No  headache, no vision change  Cardiac: No  chest pain, No  pressure, No palpitations  Respiratory:  No  shortness of breath. No  Cough  Gastrointestinal: +epigastric abdominal pain and heartburn, No  nausea, No  vomiting,  No  blood in stool, No  diarrhea, No  constipation   Musculoskeletal: No new myalgia/arthralgia  Skin: No  Rash  Endocrine: No cold intolerance,  No heat intolerance.  Neurologic: No  weakness, No  dizziness, No  slurred speech/focal weakness/facial droop  Psychiatric: No  concerns with depression, +concerns with anxiety, +sleep problems, No mood  problems  Exam:  BP (!) 151/68 (BP Location: Left Arm, Patient Position: Sitting, Cuff Size: Normal)   Pulse 97   Wt 137 lb 6.4 oz (62.3 kg)   BMI 23.58 kg/m   Constitutional: VS see above. General Appearance: alert, well-developed, well-nourished, NAD  Eyes: Normal lids and conjunctive, non-icteric sclera  Ears, Nose, Mouth, Throat: mask in place, TM normal bilaterally   Neck: No masses, trachea midline. No thyroid enlargement. No tenderness/mass appreciated. No lymphadenopathy  Respiratory: Normal respiratory effort. no wheeze, no rhonchi, no rales  Cardiovascular: S1/S2 normal, no murmur, no rub/gallop auscultated. RRR. No lower extremity edema.   Gastrointestinal: Nontender, no masses. No hepatomegaly, no splenomegaly. No hernia appreciated. Bowel sounds normal. Rectal exam deferred.   Musculoskeletal: Gait normal. No clubbing/cyanosis of digits.   Neurological: Normal balance/coordination. No tremor.   Skin: warm, dry, intact. No rash/ulcer.   Psychiatric: Fair to poor judgment/insight. Hyperactive mood and affect. Pressured speech. Oriented  x3.      ASSESSMENT/PLAN: The primary encounter diagnosis was Gastroesophageal reflux disease, esophagitis presence not specified. Diagnoses of Bipolar 1 disorder, mixed, partial remission ([place]), Insomnia due to other mental disorder, Need for diphtheria-tetanus-pertussis (Tdap) vaccine, Need for shingles vaccine, Influenza vaccination declined by patient, History of persistent cough, History of hyperlipidemia, Dry mouth, and Essential hypertension were also pertinent to this visit.   Orders Placed This Encounter  Procedures  . CBC  . COMPLETE METABOLIC PANEL WITH GFR  . Lipid panel  . Urinalysis, Routine w reflex microscopic  . SAR CoV2 Serology (COVID 19)AB(IGG)IA    Meds ordered this encounter  Medications  . Zoster Vaccine Adjuvanted [facility]) injection    Sig: Inject 0.5 mLs into the muscle once for 1 dose. Repeat in 2  to 6 months    Dispense:  0.5 mL    Refill:  1  . Tdap (ADACEL) 05-03-13.5 LF-MCG/0.5 injection    Sig: Inject 0.5 mLs into the muscle once for 1 dose.    Dispense:  0.5 mL    Refill:  0  . omeprazole (PRILOSEC) 40 MG capsule    Sig: Take 1 capsule (40 mg total) by mouth daily.    Dispense:  90 capsule    Refill:  0     Patient Instructions  Please discuss your sleep medication with your psychiatrist, they may be able to try something that will alleviate your side effects.  If you are still having concerns about heartburn even with stopping the medicines, please give me a call.  General Preventive Care  Most recent routine screening lipids/other labs: ordered today  Tobacco: don't!  Alcohol: don't!   Exercise: as tolerated to reduce risk of cardiovascular disease and diabetes. Strength training will also prevent osteoporosis.   Mental health: if need for mental health care (medicines, counseling, other), or concerns about moods, please discuss with your psychiatrist or with me!  Vaccines  Flu vaccine: recommended for almost everyone, every fall.   Shingles vaccine: Shingrix recommended after age 23. Medicare will not pay for this in the office, please discuss with your pharmacist!   Pneumonia vaccine: done!   Tetanus booster: Tdap recommended every 10 years. Medicare will not pay for this in the office, please discuss with your pharmacist!  Cancer screenings   Colon cancer screening: recommended for everyone at age 20-75  Breast cancer screening: mammogram optional after age 58  Cervical cancer screening: Pap typically discontinued after age 71 as long as previous results were normal.  Lung cancer screening: CT chest to continue per pulmonology.  Infection screenings  . HIV, Gonorrhea/Chlamydia: screening as needed. . Hepatitis C: recommended for anyone born 74-1965 . TB: certain at-risk populations, or depending on work requirements and/or travel  history Other . Bone Density Test: recommended for women at age 39 . Abdominal Aortic Aneurysm: follow-up annually in May       Visit summary with medication list and pertinent instructions was printed for patient to review. All questions at time of visit were answered - patient instructed to contact office with any additional concerns or updates. ER/RTC precautions were reviewed with the patient.   Note: Total time spent 40 minutes, greater than 50% of the visit was spent face-to-face counseling and coordinating care for the above diagnoses listed in assessment/plan.   Please note: voice recognition software was used to produce this document, and typos may escape review. Please contact Dr. Sheppard Coil for any needed clarifications.     Follow-up plan: Return  in about 1 year (around 08/26/2019) for [place] (call week prior to visit for lab orders).

## 2018-08-29 ENCOUNTER — Telehealth: Payer: Self-pay

## 2018-08-29 DIAGNOSIS — F99 Mental disorder, not otherwise specified: Secondary | ICD-10-CM | POA: Diagnosis not present

## 2018-08-29 DIAGNOSIS — K219 Gastro-esophageal reflux disease without esophagitis: Secondary | ICD-10-CM | POA: Diagnosis not present

## 2018-08-29 DIAGNOSIS — F5105 Insomnia due to other mental disorder: Secondary | ICD-10-CM | POA: Diagnosis not present

## 2018-08-29 DIAGNOSIS — Z8639 Personal history of other endocrine, nutritional and metabolic disease: Secondary | ICD-10-CM

## 2018-08-29 DIAGNOSIS — Z8709 Personal history of other diseases of the respiratory system: Secondary | ICD-10-CM | POA: Diagnosis not present

## 2018-08-29 DIAGNOSIS — Z0189 Encounter for other specified special examinations: Secondary | ICD-10-CM

## 2018-08-29 DIAGNOSIS — Z2821 Immunization not carried out because of patient refusal: Secondary | ICD-10-CM | POA: Diagnosis not present

## 2018-08-29 DIAGNOSIS — Z23 Encounter for immunization: Secondary | ICD-10-CM | POA: Diagnosis not present

## 2018-08-29 DIAGNOSIS — F3177 Bipolar disorder, in partial remission, most recent episode mixed: Secondary | ICD-10-CM | POA: Diagnosis not present

## 2018-08-29 NOTE — Telephone Encounter (Signed)
Without a history of thyroid disorder, insurance will probably not cover this test so she may be responsible for cost.

## 2018-08-29 NOTE — Telephone Encounter (Signed)
Patient called requesting provider to OK adding on thyroid panels to her lab orders she recently had drawn

## 2018-08-30 DIAGNOSIS — M9981 Other biomechanical lesions of cervical region: Secondary | ICD-10-CM | POA: Diagnosis not present

## 2018-08-30 DIAGNOSIS — M5030 Other cervical disc degeneration, unspecified cervical region: Secondary | ICD-10-CM | POA: Diagnosis not present

## 2018-08-30 DIAGNOSIS — M4722 Other spondylosis with radiculopathy, cervical region: Secondary | ICD-10-CM | POA: Diagnosis not present

## 2018-08-30 LAB — COMPLETE METABOLIC PANEL WITH GFR
AG Ratio: 1.7 (calc) (ref 1.0–2.5)
ALT: 9 U/L (ref 6–29)
AST: 12 U/L (ref 10–35)
Albumin: 4 g/dL (ref 3.6–5.1)
Alkaline phosphatase (APISO): 42 U/L (ref 37–153)
BUN: 18 mg/dL (ref 7–25)
CO2: 30 mmol/L (ref 20–32)
Calcium: 9.5 mg/dL (ref 8.6–10.4)
Chloride: 99 mmol/L (ref 98–110)
Creat: 0.9 mg/dL (ref 0.60–0.93)
GFR, Est African American: 71 mL/min/{1.73_m2} (ref >=60)
GFR, Est Non African American: 61 mL/min/{1.73_m2} (ref >=60)
Globulin: 2.3 g/dL (calc) (ref 1.9–3.7)
Glucose, Bld: 128 mg/dL — ABNORMAL HIGH (ref 65–99)
Potassium: 3.4 mmol/L — ABNORMAL LOW (ref 3.5–5.3)
Sodium: 140 mmol/L (ref 135–146)
Total Bilirubin: 0.4 mg/dL (ref 0.2–1.2)
Total Protein: 6.3 g/dL (ref 6.1–8.1)

## 2018-08-30 LAB — CBC
HCT: 46.1 % — ABNORMAL HIGH (ref 35.0–45.0)
Hemoglobin: 15.4 g/dL (ref 11.7–15.5)
MCH: 29.2 pg (ref 27.0–33.0)
MCHC: 33.4 g/dL (ref 32.0–36.0)
MCV: 87.3 fL (ref 80.0–100.0)
MPV: 10.6 fL (ref 7.5–12.5)
Platelets: 203 10*3/uL (ref 140–400)
RBC: 5.28 10*6/uL — ABNORMAL HIGH (ref 3.80–5.10)
RDW: 14.9 % (ref 11.0–15.0)
WBC: 7.6 10*3/uL (ref 3.8–10.8)

## 2018-08-30 LAB — URINALYSIS, ROUTINE W REFLEX MICROSCOPIC
Bilirubin Urine: NEGATIVE
Glucose, UA: NEGATIVE
Hgb urine dipstick: NEGATIVE
Hyaline Cast: NONE SEEN /LPF
Ketones, ur: NEGATIVE
Nitrite: POSITIVE — AB
Protein, ur: NEGATIVE
Specific Gravity, Urine: 1.012 (ref 1.001–1.03)
Squamous Epithelial / HPF: NONE SEEN /HPF (ref <=5)
pH: 5.5 (ref 5.0–8.0)

## 2018-08-30 LAB — LIPID PANEL
Cholesterol: 253 mg/dL — ABNORMAL HIGH (ref <200)
HDL: 59 mg/dL (ref >=50)
LDL Cholesterol (Calc): 162 mg/dL (calc) — ABNORMAL HIGH
Non-HDL Cholesterol (Calc): 194 mg/dL (calc) — ABNORMAL HIGH (ref <130)
Total CHOL/HDL Ratio: 4.3 (calc) (ref <5.0)
Triglycerides: 167 mg/dL — ABNORMAL HIGH (ref <150)

## 2018-08-30 LAB — SAR COV2 SEROLOGY (COVID19)AB(IGG),IA: SARS CoV2 AB IGG: NEGATIVE

## 2018-08-30 MED ORDER — CIPROFLOXACIN HCL 500 MG PO TABS: 500.0000 mg | ORAL_TABLET | Freq: Two times a day (BID) | ORAL | 0 refills | Status: DC

## 2018-08-30 NOTE — Telephone Encounter (Signed)
Ok that's fine, she hadn't told me about this, will add to her chart, orders ar ein for labs.Marland KitchenMarland KitchenMarland Kitchen

## 2018-08-30 NOTE — Telephone Encounter (Signed)
Patient made aware to get labs done. KG LPN

## 2018-08-30 NOTE — Addendum Note (Signed)
Addended by: Maryla Morrow on: 08/30/2018 01:27 PM   Modules accepted: Orders

## 2018-08-30 NOTE — Telephone Encounter (Signed)
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 KG LPN

## 2018-09-01 IMAGING — DX DG HIP (WITH OR WITHOUT PELVIS) 2-3V*R*
3 series · 3 of 3 positions shown · non-contrast
Comparison: None.

CLINICAL DATA: Right lateral hip pain x4 weeks without known
injury.

EXAM:
DG HIP (WITH OR WITHOUT PELVIS) 2-3V RIGHT

[pelvis ap]
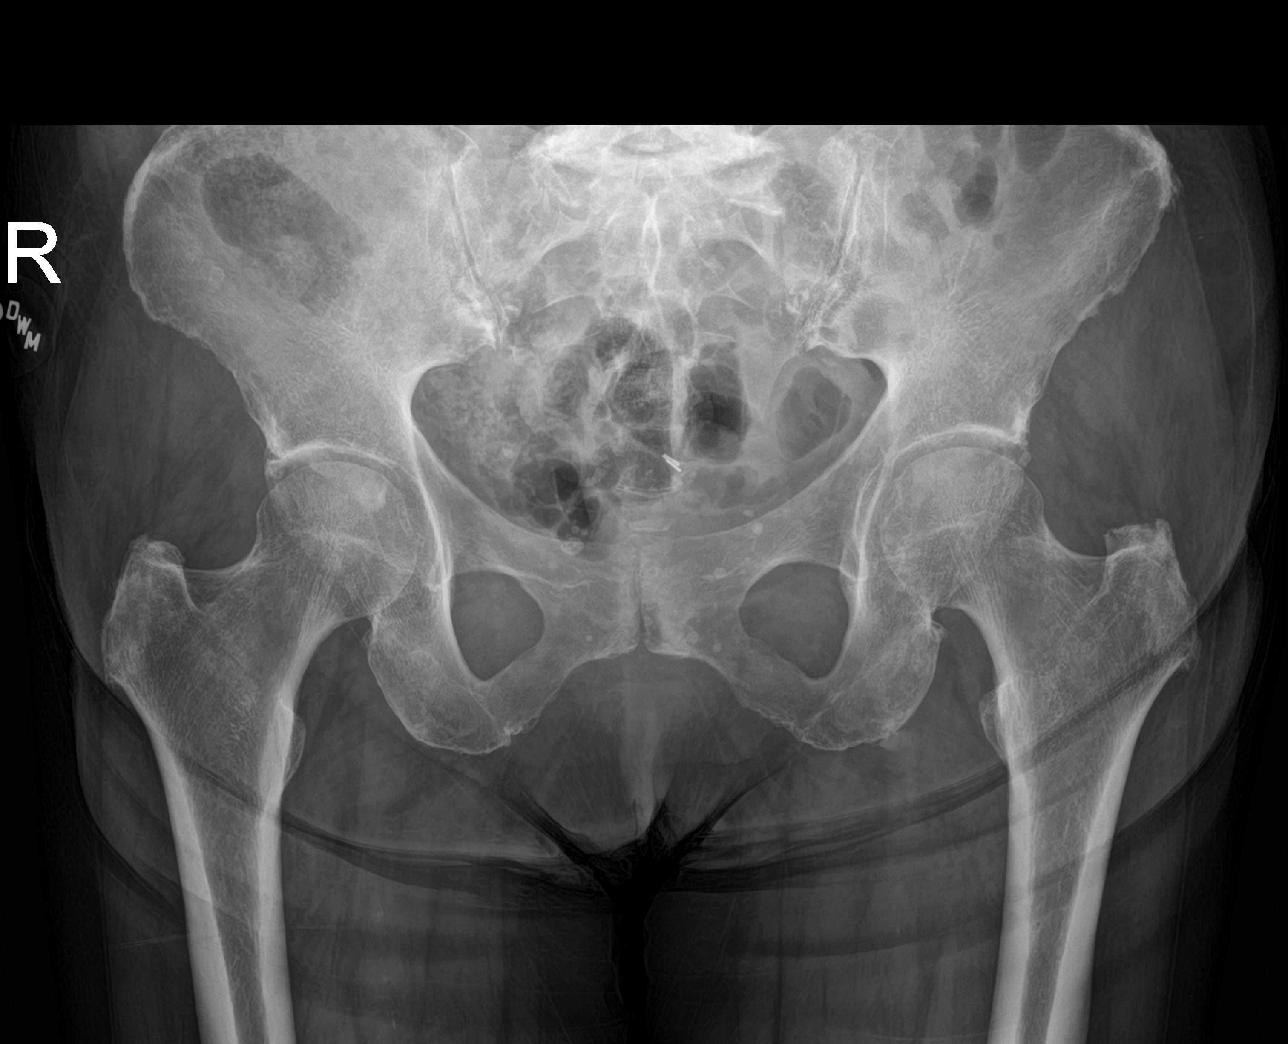

[hip ap]
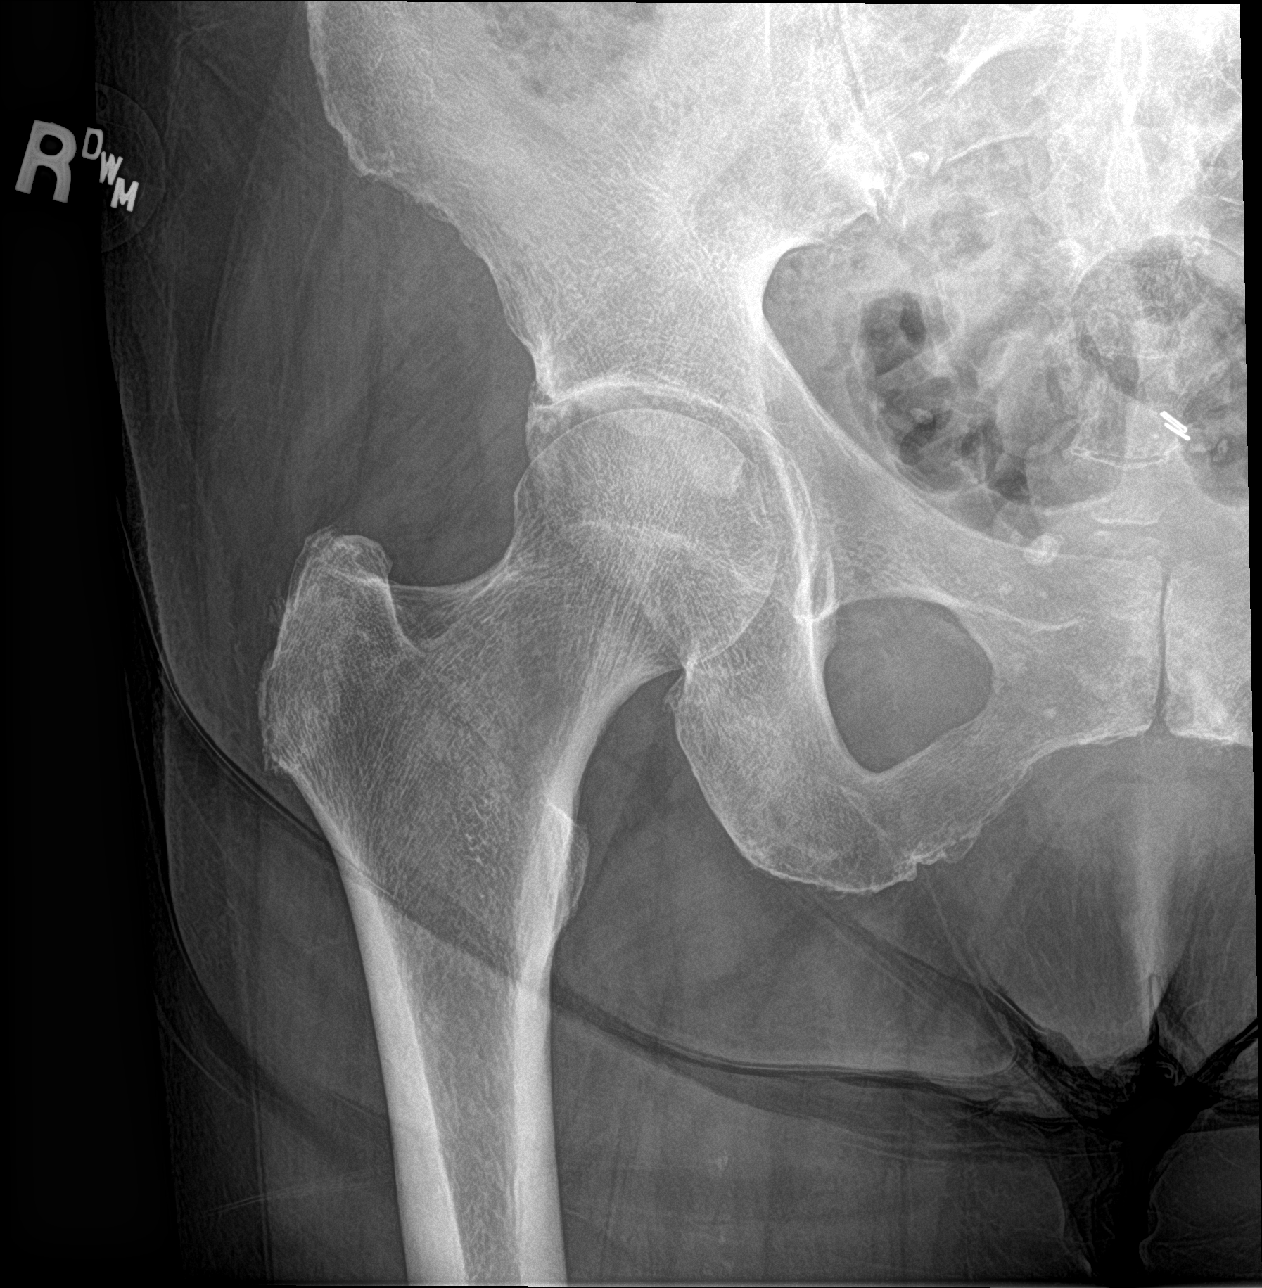

[hip lat]
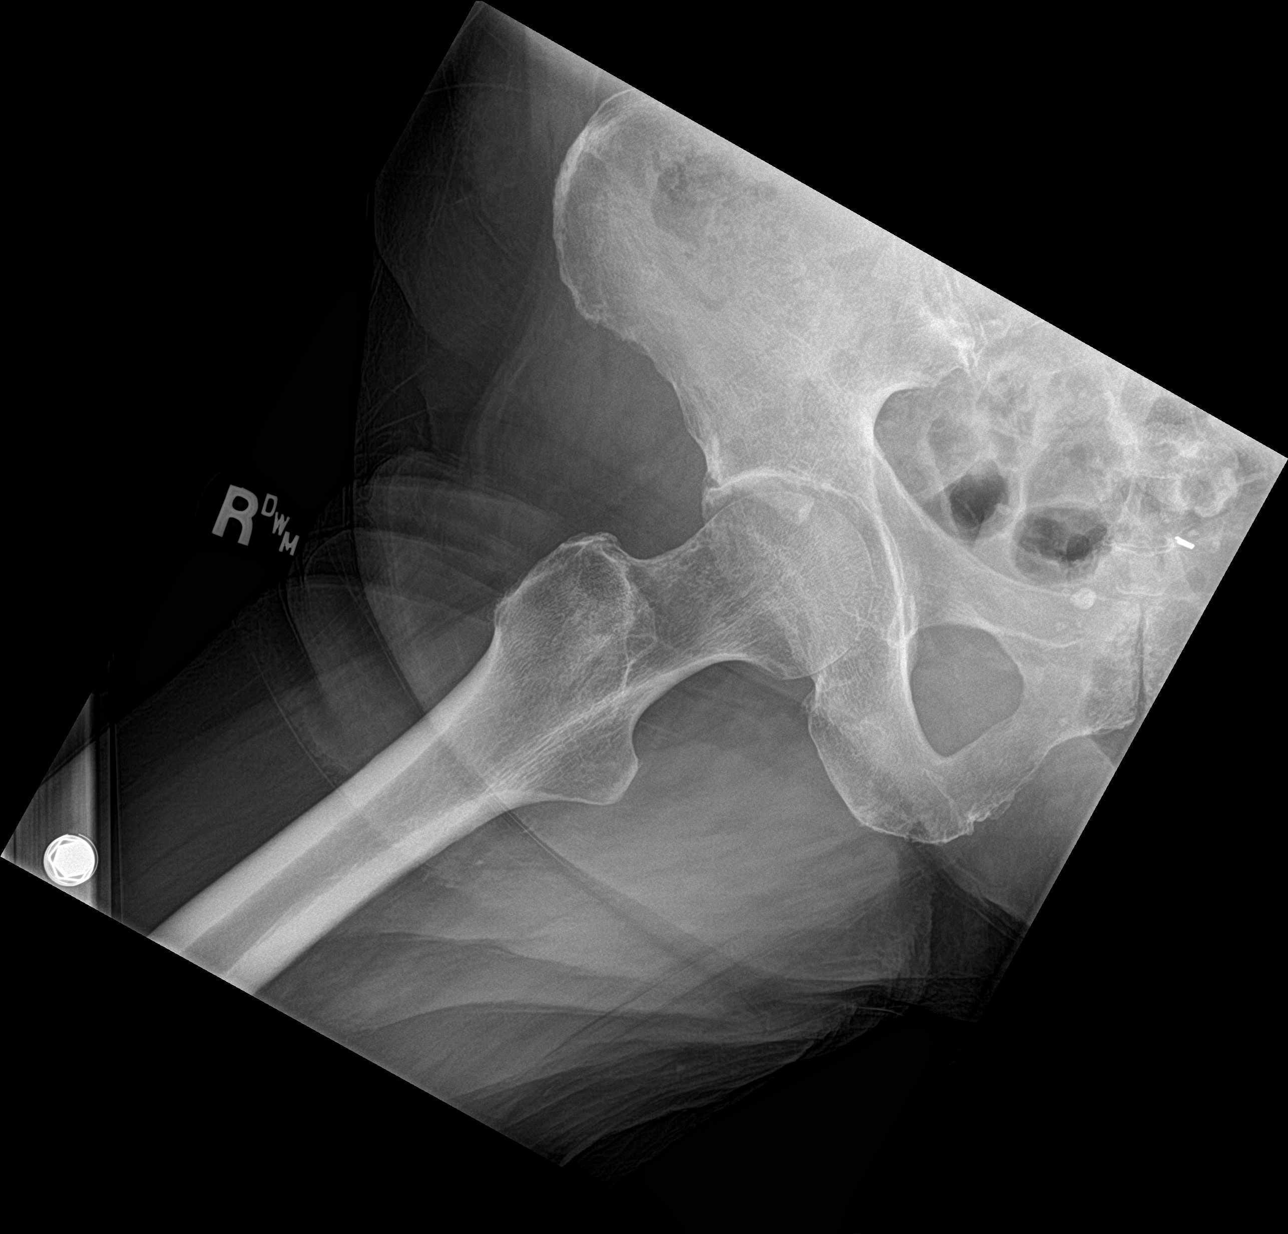

[3 of 3 positions shown; findings below may reference images not displayed]

FINDINGS: The included lumbar spine demonstrates degenerative disc disease
with disc space flattening, vacuum disc phenomenon and endplate
sclerosis at L4-5 and L5-S1. Facet arthropathy is noted at the
lumbosacral junction. The arcuate lines the sacrum appear intact.
Mild degenerative spurring about the SI joints bilaterally. The bony
pelvis appears intact. There is sclerosis about the pubic symphysis
with joint space narrowing and degenerative subcortical cystic
change of the left parasymphysis. The femoral heads are maintained
within both hip joints with right slightly greater than left joint
space narrowing of the right hip relative to left. The pubic rami
appear intact. The proximal femora appear intact. Acetabular
components are not shallow nor is there overcoverage of the femoral
heads. No suspicious osseous lesions. No suspicious soft tissue
abnormality.
IMPRESSION: 1. Lower lumbar degenerative disc disease and facet arthropathy from
L4 through S1.
2. Osteoarthritis of the SI joints with spurring as well as
osteoarthritis of the pubic symphysis.
3. Mild degenerative joint space narrowing of the right hip. No
acute osseous abnormality.

## 2018-09-03 DIAGNOSIS — G4733 Obstructive sleep apnea (adult) (pediatric): Secondary | ICD-10-CM | POA: Diagnosis not present

## 2018-09-04 DIAGNOSIS — L821 Other seborrheic keratosis: Secondary | ICD-10-CM | POA: Diagnosis not present

## 2018-09-04 DIAGNOSIS — L309 Dermatitis, unspecified: Secondary | ICD-10-CM | POA: Diagnosis not present

## 2018-09-04 DIAGNOSIS — L570 Actinic keratosis: Secondary | ICD-10-CM | POA: Diagnosis not present

## 2018-09-04 DIAGNOSIS — L728 Other follicular cysts of the skin and subcutaneous tissue: Secondary | ICD-10-CM | POA: Diagnosis not present

## 2018-09-04 DIAGNOSIS — C44222 Squamous cell carcinoma of skin of right ear and external auricular canal: Secondary | ICD-10-CM | POA: Diagnosis not present

## 2018-09-05 DIAGNOSIS — R0683 Snoring: Secondary | ICD-10-CM | POA: Diagnosis not present

## 2018-09-05 DIAGNOSIS — J449 Chronic obstructive pulmonary disease, unspecified: Secondary | ICD-10-CM | POA: Diagnosis not present

## 2018-09-05 DIAGNOSIS — G4733 Obstructive sleep apnea (adult) (pediatric): Secondary | ICD-10-CM | POA: Diagnosis not present

## 2018-09-05 DIAGNOSIS — F17200 Nicotine dependence, unspecified, uncomplicated: Secondary | ICD-10-CM | POA: Diagnosis not present

## 2018-09-24 DIAGNOSIS — Z23 Encounter for immunization: Secondary | ICD-10-CM | POA: Diagnosis not present

## 2018-09-26 DIAGNOSIS — J441 Chronic obstructive pulmonary disease with (acute) exacerbation: Secondary | ICD-10-CM | POA: Diagnosis not present

## 2018-09-26 DIAGNOSIS — F17200 Nicotine dependence, unspecified, uncomplicated: Secondary | ICD-10-CM | POA: Diagnosis not present

## 2018-09-26 DIAGNOSIS — R0600 Dyspnea, unspecified: Secondary | ICD-10-CM | POA: Diagnosis not present

## 2018-09-26 DIAGNOSIS — G4733 Obstructive sleep apnea (adult) (pediatric): Secondary | ICD-10-CM | POA: Diagnosis not present

## 2018-10-10 ENCOUNTER — Telehealth: Payer: Self-pay | Admitting: Osteopathic Medicine

## 2018-10-10 DIAGNOSIS — F17200 Nicotine dependence, unspecified, uncomplicated: Secondary | ICD-10-CM | POA: Diagnosis not present

## 2018-10-10 DIAGNOSIS — G4733 Obstructive sleep apnea (adult) (pediatric): Secondary | ICD-10-CM | POA: Diagnosis not present

## 2018-10-10 DIAGNOSIS — J449 Chronic obstructive pulmonary disease, unspecified: Secondary | ICD-10-CM | POA: Diagnosis not present

## 2018-10-10 DIAGNOSIS — J3089 Other allergic rhinitis: Secondary | ICD-10-CM | POA: Diagnosis not present

## 2018-10-10 NOTE — Telephone Encounter (Signed)
Pls see previous note.

## 2018-10-10 NOTE — Telephone Encounter (Signed)
Okay, thank you!

## 2018-10-10 NOTE — Telephone Encounter (Signed)
Noted. Thank you for the update. Thanks.

## 2018-10-10 NOTE — Telephone Encounter (Signed)
FYI: Pt called. She is complaining pf  nausea and thinks it maybe due to her sugar being high. She did not want a virtual visit. I spoke to Apolonio Schneiders and she recommended that patient go to Urgent Care to be seen.. Recommendation given to patient. Patient responded with calling us stupid and she is going to transfer her records to another practice.

## 2018-10-11 NOTE — Telephone Encounter (Signed)
Noted  

## 2018-10-23 ENCOUNTER — Other Ambulatory Visit: Payer: Self-pay

## 2018-10-23 ENCOUNTER — Encounter: Payer: Self-pay | Admitting: Osteopathic Medicine

## 2018-10-23 ENCOUNTER — Ambulatory Visit (INDEPENDENT_AMBULATORY_CARE_PROVIDER_SITE_OTHER): Payer: Medicare Other | Admitting: Osteopathic Medicine

## 2018-10-23 VITALS — BP 119/72 | HR 90 | Temp 98.6°F | Wt 147.0 lb

## 2018-10-23 DIAGNOSIS — I714 Abdominal aortic aneurysm, without rupture, unspecified: Secondary | ICD-10-CM

## 2018-10-23 DIAGNOSIS — F3177 Bipolar disorder, in partial remission, most recent episode mixed: Secondary | ICD-10-CM | POA: Diagnosis not present

## 2018-10-23 DIAGNOSIS — I671 Cerebral aneurysm, nonruptured: Secondary | ICD-10-CM | POA: Diagnosis not present

## 2018-10-23 DIAGNOSIS — M62838 Other muscle spasm: Secondary | ICD-10-CM | POA: Diagnosis not present

## 2018-10-23 DIAGNOSIS — I10 Essential (primary) hypertension: Secondary | ICD-10-CM

## 2018-10-23 DIAGNOSIS — Z8639 Personal history of other endocrine, nutritional and metabolic disease: Secondary | ICD-10-CM | POA: Diagnosis not present

## 2018-10-23 DIAGNOSIS — Z0189 Encounter for other specified special examinations: Secondary | ICD-10-CM

## 2018-10-23 MED ORDER — NAPROXEN 500 MG PO TABS: 500.0000 mg | ORAL_TABLET | Freq: Two times a day (BID) | ORAL | 1 refills | Status: DC | PRN

## 2018-10-23 MED ORDER — CYCLOBENZAPRINE HCL 5 MG PO TABS: 5.0000 mg | ORAL_TABLET | Freq: Three times a day (TID) | ORAL | 1 refills | Status: DC | PRN

## 2018-10-23 NOTE — Progress Notes (Signed)
HPI: Anita Krause is a 78 y.o. female who  has a past medical history of AAA (abdominal aortic aneurysm) without rupture ([place]) (05/03/2017), Bipolar 1 disorder ([place]), Brain aneurysm (07/02/2017), Chronic back pain (03/14/2017), and Chronic bronchitis ([place]) (09/21/2016).  she presents to [facility] today, 10/23/18,  for chief complaint of:  Requesting MRI and other follow-up for aneurysms, see below Requesting ears flushed Wants sugars checked  History of brain aneurysm status post stent/coil.  Patient is asking for MRI to follow this up.  I can see some CT scans in the past that demonstrate what appears to be stable coil/stent of basilar artery.  Patient does not have established relationship with a neurosurgeon locally.  Patient is requesting follow-up scan for "aneurysm in my stomach" I asked specifically about abdominal aortic aneurysm if this is what she means and she says she does not know she just points to her pelvic area.  Record review does demonstrate history of AAA.  Patient is wanting her ears to be flushed.  She says she puts in some kind of salve for eczema and just wants to make sure that it is all flushed out.  No hearing loss, ear pain.  Patient has some concerns about wanting her sugars to be checked.  She thinks that her sugars are running too high because sometimes she will feel a bit mentally foggy and slightly nauseous.  No syncope/presyncope, no vertigo or dizziness.     At today's visit 10/23/18 ... PMH, PSH, FH reviewed and updated as needed.  Current medication list and allergy/intolerance hx reviewed and updated as needed. (See remainder of HPI, ROS, Phys Exam below)   No results found.  Results for orders placed or performed in visit on 10/23/18 (from the past 72 hour(s))  CBC     Status: None   Collection Time: 10/23/18 11:28 AM  Result Value Ref Range   WBC 5.4 3.8 - 10.8 Thousand/uL   RBC 5.06 3.80 - 5.10 Million/uL   Hemoglobin 14.8 11.7 - 15.5 g/dL   HCT 44.4 35.0 - 45.0 %   MCV 87.7 80.0 - 100.0 fL   MCH 29.2 27.0 - 33.0 pg   MCHC 33.3 32.0 - 36.0 g/dL   RDW 14.0 11.0 - 15.0 %   Platelets 206 140 - 400 Thousand/uL   MPV 10.8 7.5 - 12.5 fL  COMPLETE METABOLIC PANEL WITH GFR     Status: Abnormal   Collection Time: 10/23/18 11:28 AM  Result Value Ref Range   Glucose, Bld 104 (H) 65 - 99 mg/dL    Comment: .            Fasting reference interval . For someone without known diabetes, a glucose value between 100 and 125 mg/dL is consistent with prediabetes and should be confirmed with a follow-up test. .    BUN 9 7 - 25 mg/dL   Creat 0.90 0.60 - 0.93 mg/dL    Comment: For patients >71 years of age, the reference limit for Creatinine is approximately 13% higher for people identified as African-American. .    GFR, Est Non African American 61 > OR = 60 mL/min/1.40m2   GFR, Est African American 71 > OR = 60 mL/min/1.51m2   BUN/Creatinine Ratio NOT APPLICABLE 6 - 22 (calc)   Sodium 138 135 - 146 mmol/L   Potassium 4.1 3.5 - 5.3 mmol/L   Chloride 96 (L) 98 - 110 mmol/L   CO2 34 (H) 20 - 32 mmol/L  Calcium 9.7 8.6 - 10.4 mg/dL   Total Protein 6.1 6.1 - 8.1 g/dL   Albumin 3.9 3.6 - 5.1 g/dL   Globulin 2.2 1.9 - 3.7 g/dL (calc)   AG Ratio 1.8 1.0 - 2.5 (calc)   Total Bilirubin 0.3 0.2 - 1.2 mg/dL   Alkaline phosphatase (APISO) 48 37 - 153 U/L   AST 15 10 - 35 U/L   ALT 7 6 - 29 U/L  TSH     Status: None   Collection Time: 10/23/18 11:28 AM  Result Value Ref Range   TSH 1.21 0.40 - 4.50 mIU/L  Hemoglobin A1c     Status: None   Collection Time: 10/23/18 11:28 AM  Result Value Ref Range   Hgb A1c MFr Bld 5.3 <5.7 % of total Hgb    Comment: For the purpose of screening for the presence of diabetes: . <5.7%       Consistent with the absence of diabetes 5.7-6.4%    Consistent with increased risk for diabetes             (prediabetes) > or =6.5%  Consistent with diabetes . This assay  result is consistent with a decreased risk of diabetes. . Currently, no consensus exists regarding use of hemoglobin A1c for diagnosis of diabetes in children. . According to American Diabetes Association (ADA) guidelines, hemoglobin A1c <7.0% represents optimal control in non-pregnant diabetic patients. Different metrics may apply to specific patient populations.  Standards of Medical Care in Diabetes(ADA). .    Mean Plasma Glucose 105 (calc)   eAG (mmol/L) 5.8 (calc)  Urinalysis, Routine w reflex microscopic     Status: Abnormal   Collection Time: 10/23/18 11:28 AM  Result Value Ref Range   Color, Urine DARK YELLOW YELLOW   APPearance CLEAR CLEAR   Specific Gravity, Urine 1.023 1.001 - 1.03   pH < OR = 5.0 5.0 - 8.0   Glucose, UA NEGATIVE NEGATIVE   Bilirubin Urine NEGATIVE NEGATIVE   Ketones, ur NEGATIVE NEGATIVE   Hgb urine dipstick NEGATIVE NEGATIVE   Protein, ur NEGATIVE NEGATIVE   Nitrite NEGATIVE NEGATIVE   Leukocytes,Ua TRACE (A) NEGATIVE   WBC, UA NONE SEEN 0 - 5 /HPF   RBC / HPF 0-2 0 - 2 /HPF   Squamous Epithelial / LPF 0-5 < OR = 5 /HPF   Bacteria, UA NONE SEEN NONE SEEN /HPF   Hyaline Cast NONE SEEN NONE SEEN /LPF          ASSESSMENT/PLAN: The primary encounter diagnosis was History of thyroid cyst. Diagnoses of Bipolar 1 disorder, mixed, partial remission ([place]), Patient request for diagnostic testing, History of hyperlipidemia, Essential hypertension, Muscle spasm, Brain aneurysm, and AAA (abdominal aortic aneurysm) without rupture ([place]) were also pertinent to this visit.    Orders Placed This Encounter  Procedures  . MICROSCOPIC MESSAGE  . CBC  . COMPLETE METABOLIC PANEL WITH GFR  . TSH  . Hemoglobin A1c  . Urinalysis, Routine w reflex microscopic  . Ambulatory referral to Neurosurgery  . VAS [place] AAA DUPLEX     Meds ordered this encounter  Medications  . cyclobenzaprine (FLEXERIL) 5 MG tablet    Sig: Take 1 tablet (5 mg total) by mouth 3  (three) times daily as needed for muscle spasms.    Dispense:  90 tablet    Refill:  1  . naproxen (NAPROSYN) 500 MG tablet    Sig: Take 1 tablet (500 mg total) by mouth 2 (two) times daily as  needed for moderate pain.    Dispense:  60 tablet    Refill:  1    Patient Instructions  Plan:  Will get blood work today.   I will consult with a specialist regrading which test to order to follow up the aneurysm, and I will confirm whatever aneurysm in the abdominal area.   Will call you later this week or early next week with results of labs, and plan for following up on aneurysm issues. We may send you to a specialist for brain aneurysm care.        Follow-up plan: Return for RECHECK PENDING RESULTS / IF WORSE OR CHANGE.                                                 ################################################# ################################################# ################################################# #################################################    Current Meds  Medication Sig  . albuterol (PROVENTIL HFA;VENTOLIN HFA) 108 (90 Base) MCG/ACT inhaler Inhale 1-2 puffs into the lungs every 4 (four) hours as needed for wheezing or shortness of breath.  Marland Kitchen albuterol (PROVENTIL) (5 MG/ML) 0.5% nebulizer solution Take 0.5 mLs (2.5 mg total) by nebulization every 6 (six) hours as needed for wheezing or shortness of breath.  . cyclobenzaprine (FLEXERIL) 5 MG tablet Take 1 tablet (5 mg total) by mouth 3 (three) times daily as needed for muscle spasms.  . divalproex (DEPAKOTE) 250 MG DR tablet TK 3 TS PO HS  . Fluticasone-Umeclidin-Vilant (TRELEGY ELLIPTA) 100-62.5-25 MCG/INH AEPB Inhale 1 Inhaler into the lungs daily. For chronic bronchitis / COPD  . hydrOXYzine (ATARAX/VISTARIL) 25 MG tablet Take 25 mg by mouth 2 (two) times daily as needed for anxiety.  . naproxen (NAPROSYN) 500 MG tablet Take 1 tablet (500 mg total) by mouth 2  (two) times daily as needed for moderate pain.  Marland Kitchen triamterene-hydrochlorothiazide (DYAZIDE) 37.5-25 MG capsule Take 1 each (1 capsule total) by mouth 2 (two) times daily. For high blood pressure  . [DISCONTINUED] ciprofloxacin (CIPRO) 500 MG tablet Take 1 tablet (500 mg total) by mouth 2 (two) times daily.  . [DISCONTINUED] cyclobenzaprine (FLEXERIL) 5 MG tablet Take 1 tablet (5 mg total) by mouth 3 (three) times daily as needed for muscle spasms.  . [DISCONTINUED] naproxen (NAPROSYN) 500 MG tablet Take 1 tablet (500 mg total) by mouth 2 (two) times daily as needed for moderate pain.    Allergies  Allergen Reactions  . Erythromycin Anaphylaxis    Throat closes up, ling in mouth peals  . Nitrofurantoin Macrocrystal Anaphylaxis    Pt states her throat closes up  . Penicillins Anaphylaxis  . Colesevelam Hcl Other (See Comments)  . Doxycycline Rash  . Lamotrigine Rash       Review of Systems:  Constitutional: No recent illness  HEENT: No  headache, no vision change  Cardiac: No  chest pain, No  pressure, No palpitations  Respiratory:  No  shortness of breath. No  Cough  Gastrointestinal: No  abdominal pain, no change on bowel habits  Musculoskeletal: No new myalgia/arthralgia  Skin: No  Rash  Hem/Onc: No  easy bruising/bleeding, No  abnormal lumps/bumps  Neurologic: No  weakness, No  Dizziness  Psychiatric: No  concerns with depression, No  concerns with anxiety  Exam:  BP 119/72 (BP Location: Left Arm, Patient Position: Sitting, Cuff Size: Normal)   Pulse 90   Temp 98.6 F (37 C) (  Oral)   Wt 147 lb 0.6 oz (66.7 kg)   BMI 25.24 kg/m   Constitutional: VS see above. General Appearance: alert, well-developed, well-nourished, NAD  Eyes: Normal lids and conjunctive, non-icteric sclera  Ears, Nose, Mouth, Throat: Tympanic membranes normal bilaterally, there is minimal wax in the auditory canals but no pathology  Neck: No masses, trachea midline.   Respiratory: Normal  respiratory effort. no wheeze, no rhonchi, no rales  Cardiovascular: S1/S2 normal, no murmur, no rub/gallop auscultated. RRR.   Musculoskeletal: Gait normal. Symmetric and independent movement of all extremities  Neurological: Normal balance/coordination. No tremor.  Skin: warm, dry, intact.   Psychiatric: Normal judgment/insight. Normal mood and affect. Oriented x3.       Visit summary with medication list and pertinent instructions was printed for patient to review, patient was advised to alert [place] if any updates are needed. All questions at time of visit were answered - patient instructed to contact office with any additional concerns. ER/RTC precautions were reviewed with the patient and understanding verbalized.   Note: Total time spent 40 minutes, greater than 50% of the visit was spent face-to-face counseling and coordinating care for the following: The primary encounter diagnosis was History of thyroid cyst. Diagnoses of Bipolar 1 disorder, mixed, partial remission ([place]), Patient request for diagnostic testing, History of hyperlipidemia, Essential hypertension, Muscle spasm, Brain aneurysm, and AAA (abdominal aortic aneurysm) without rupture ([place]) were also pertinent to this visit.Marland Kitchen  Please note: voice recognition software was used to produce this document, and typos may escape review. Please contact Dr. Sheppard Coil for any needed clarifications.    Follow up plan: Return for RECHECK PENDING RESULTS / IF WORSE OR CHANGE.

## 2018-10-23 NOTE — Patient Instructions (Addendum)
Plan:  Will get blood work today.   I will consult with a specialist regrading which test to order to follow up the aneurysm, and I will confirm whatever aneurysm in the abdominal area.   Will call you later this week or early next week with results of labs, and plan for following up on aneurysm issues. We may send you to a specialist for brain aneurysm care.

## 2018-10-24 LAB — URINALYSIS, ROUTINE W REFLEX MICROSCOPIC
Bacteria, UA: NONE SEEN /HPF
Bilirubin Urine: NEGATIVE
Glucose, UA: NEGATIVE
Hgb urine dipstick: NEGATIVE
Hyaline Cast: NONE SEEN /LPF
Ketones, ur: NEGATIVE
Nitrite: NEGATIVE
Protein, ur: NEGATIVE
Specific Gravity, Urine: 1.023 (ref 1.001–1.03)
WBC, UA: NONE SEEN /HPF (ref 0–5)
pH: <=5 (ref 5.0–8.0)

## 2018-10-24 LAB — COMPLETE METABOLIC PANEL WITH GFR
AG Ratio: 1.8 (calc) (ref 1.0–2.5)
ALT: 7 U/L (ref 6–29)
AST: 15 U/L (ref 10–35)
Albumin: 3.9 g/dL (ref 3.6–5.1)
Alkaline phosphatase (APISO): 48 U/L (ref 37–153)
BUN: 9 mg/dL (ref 7–25)
CO2: 34 mmol/L — ABNORMAL HIGH (ref 20–32)
Calcium: 9.7 mg/dL (ref 8.6–10.4)
Chloride: 96 mmol/L — ABNORMAL LOW (ref 98–110)
Creat: 0.9 mg/dL (ref 0.60–0.93)
GFR, Est African American: 71 mL/min/{1.73_m2} (ref >=60)
GFR, Est Non African American: 61 mL/min/{1.73_m2} (ref >=60)
Globulin: 2.2 g/dL (calc) (ref 1.9–3.7)
Glucose, Bld: 104 mg/dL — ABNORMAL HIGH (ref 65–99)
Potassium: 4.1 mmol/L (ref 3.5–5.3)
Sodium: 138 mmol/L (ref 135–146)
Total Bilirubin: 0.3 mg/dL (ref 0.2–1.2)
Total Protein: 6.1 g/dL (ref 6.1–8.1)

## 2018-10-24 LAB — CBC
HCT: 44.4 % (ref 35.0–45.0)
Hemoglobin: 14.8 g/dL (ref 11.7–15.5)
MCH: 29.2 pg (ref 27.0–33.0)
MCHC: 33.3 g/dL (ref 32.0–36.0)
MCV: 87.7 fL (ref 80.0–100.0)
MPV: 10.8 fL (ref 7.5–12.5)
Platelets: 206 10*3/uL (ref 140–400)
RBC: 5.06 10*6/uL (ref 3.80–5.10)
RDW: 14 % (ref 11.0–15.0)
WBC: 5.4 10*3/uL (ref 3.8–10.8)

## 2018-10-24 LAB — TSH: TSH: 1.21 mIU/L (ref 0.40–4.50)

## 2018-10-24 LAB — HEMOGLOBIN A1C
Hgb A1c MFr Bld: 5.3 % of total Hgb (ref <5.7)
Mean Plasma Glucose: 105 (calc)
eAG (mmol/L): 5.8 (calc)

## 2018-10-25 DIAGNOSIS — F411 Generalized anxiety disorder: Secondary | ICD-10-CM | POA: Diagnosis not present

## 2018-10-25 DIAGNOSIS — Z9114 Patient's other noncompliance with medication regimen: Secondary | ICD-10-CM | POA: Diagnosis not present

## 2018-10-25 DIAGNOSIS — F5101 Primary insomnia: Secondary | ICD-10-CM | POA: Diagnosis not present

## 2018-10-25 DIAGNOSIS — F319 Bipolar disorder, unspecified: Secondary | ICD-10-CM | POA: Diagnosis not present

## 2018-10-29 ENCOUNTER — Telehealth (HOSPITAL_COMMUNITY): Payer: Self-pay | Admitting: *Deleted

## 2018-10-29 ENCOUNTER — Telehealth: Payer: Self-pay | Admitting: Osteopathic Medicine

## 2018-10-29 NOTE — Telephone Encounter (Signed)
Called pt to ask her to call [facility] as she is on their recall list to have follow up with provider and AAA ultrasound.  Order for AAA only placed by Dr. Sheppard Coil cancelled as Dr. Donnetta Hutching will order exam to coincide with provider visit.

## 2018-10-29 NOTE — Telephone Encounter (Signed)
Totally ok to cancel. Please call patient and let her know though, she is quite resistant to following up with specialists, please emphasize important of following up as directed... Thanks!

## 2018-10-29 NOTE — Telephone Encounter (Signed)
-----   Message from [employer], RVT, RDCS sent at 10/28/2018  4:49 PM EDT ----- Regarding: order follow up on your patient Hi Dr. Sheppard Coil,  I have an order to schedule an AAA on [place], that I will cancel if you are okay with my doing that.  The patient is routinely followed for this by [facility] physician and is on their recall list to have the exam and an office visit following.    Please let me know if it is okay to cancel.  Have a great day, Juliann Pulse

## 2018-10-31 ENCOUNTER — Other Ambulatory Visit (HOSPITAL_BASED_OUTPATIENT_CLINIC_OR_DEPARTMENT_OTHER): Payer: Medicare Other

## 2018-11-01 DIAGNOSIS — M47812 Spondylosis without myelopathy or radiculopathy, cervical region: Secondary | ICD-10-CM | POA: Diagnosis not present

## 2018-11-01 DIAGNOSIS — M7061 Trochanteric bursitis, right hip: Secondary | ICD-10-CM | POA: Diagnosis not present

## 2018-12-09 DIAGNOSIS — F17210 Nicotine dependence, cigarettes, uncomplicated: Secondary | ICD-10-CM | POA: Diagnosis not present

## 2018-12-09 DIAGNOSIS — J3089 Other allergic rhinitis: Secondary | ICD-10-CM | POA: Diagnosis not present

## 2018-12-09 DIAGNOSIS — J449 Chronic obstructive pulmonary disease, unspecified: Secondary | ICD-10-CM | POA: Diagnosis not present

## 2018-12-09 DIAGNOSIS — G4733 Obstructive sleep apnea (adult) (pediatric): Secondary | ICD-10-CM | POA: Diagnosis not present

## 2018-12-09 DIAGNOSIS — R05 Cough: Secondary | ICD-10-CM | POA: Diagnosis not present

## 2018-12-11 DIAGNOSIS — D1801 Hemangioma of skin and subcutaneous tissue: Secondary | ICD-10-CM | POA: Diagnosis not present

## 2018-12-11 DIAGNOSIS — C44311 Basal cell carcinoma of skin of nose: Secondary | ICD-10-CM | POA: Diagnosis not present

## 2019-01-13 DIAGNOSIS — J3089 Other allergic rhinitis: Secondary | ICD-10-CM | POA: Diagnosis not present

## 2019-01-13 DIAGNOSIS — G4733 Obstructive sleep apnea (adult) (pediatric): Secondary | ICD-10-CM | POA: Diagnosis not present

## 2019-01-13 DIAGNOSIS — F17210 Nicotine dependence, cigarettes, uncomplicated: Secondary | ICD-10-CM | POA: Diagnosis not present

## 2019-01-13 DIAGNOSIS — J449 Chronic obstructive pulmonary disease, unspecified: Secondary | ICD-10-CM | POA: Diagnosis not present

## 2019-01-16 ENCOUNTER — Other Ambulatory Visit: Payer: Self-pay

## 2019-01-16 DIAGNOSIS — M62838 Other muscle spasm: Secondary | ICD-10-CM

## 2019-01-16 MED ORDER — CYCLOBENZAPRINE HCL 5 MG PO TABS: 5.0000 mg | ORAL_TABLET | Freq: Three times a day (TID) | ORAL | 1 refills | Status: DC | PRN

## 2019-01-16 MED ORDER — NAPROXEN 500 MG PO TABS: 500.0000 mg | ORAL_TABLET | Freq: Two times a day (BID) | ORAL | 1 refills | Status: DC | PRN

## 2019-02-07 DIAGNOSIS — J9809 Other diseases of bronchus, not elsewhere classified: Secondary | ICD-10-CM | POA: Diagnosis not present

## 2019-02-07 DIAGNOSIS — I2510 Atherosclerotic heart disease of native coronary artery without angina pectoris: Secondary | ICD-10-CM | POA: Diagnosis not present

## 2019-02-07 DIAGNOSIS — R918 Other nonspecific abnormal finding of lung field: Secondary | ICD-10-CM | POA: Diagnosis not present

## 2019-02-11 DIAGNOSIS — Z9114 Patient's other noncompliance with medication regimen: Secondary | ICD-10-CM | POA: Diagnosis not present

## 2019-02-11 DIAGNOSIS — F319 Bipolar disorder, unspecified: Secondary | ICD-10-CM | POA: Diagnosis not present

## 2019-02-11 DIAGNOSIS — F5101 Primary insomnia: Secondary | ICD-10-CM | POA: Diagnosis not present

## 2019-02-11 DIAGNOSIS — F411 Generalized anxiety disorder: Secondary | ICD-10-CM | POA: Diagnosis not present

## 2019-02-12 DIAGNOSIS — M545 Low back pain: Secondary | ICD-10-CM | POA: Diagnosis not present

## 2019-02-17 DIAGNOSIS — M48061 Spinal stenosis, lumbar region without neurogenic claudication: Secondary | ICD-10-CM | POA: Diagnosis not present

## 2019-02-17 DIAGNOSIS — M533 Sacrococcygeal disorders, not elsewhere classified: Secondary | ICD-10-CM | POA: Diagnosis not present

## 2019-02-17 NOTE — Progress Notes (Deleted)
Office Note     CC:  follow up Requesting Provider:  Emeterio Reeve, DO  HPI: Anita Krause is a 79 y.o. (07/18/1940) female who presents with asymptomatic abdominal aortic aneurysm.  She is here today for annual surveillance.  The pt not on a statin for cholesterol management.  The pt not on a daily aspirin.   Other AC:  none The pt is on diuretic for hypertension.   The pt is not diabetic.   Tobacco hx:  Quit cigs in Sept. 2019  Past Medical History:  Diagnosis Date  . AAA (abdominal aortic aneurysm) without rupture ([place]) 05/03/2017   3.4 cm May 2019  . Bipolar 1 disorder ([place])   . 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  . Chronic back pain 03/14/2017  . Chronic bronchitis ([place]) 09/21/2016    Past Surgical History:  Procedure Laterality Date  . ABDOMINAL HYSTERECTOMY    . ANEURYSM COILING    . BACK SURGERY    . POLYPECTOMY     VOCAL CORD    Social History   Socioeconomic History  . Marital status: Divorced    Spouse name: Not on file  . Number of children: Not on file  . Years of education: Not on file  . Highest education level: Not on file  Occupational History  . Not on file  Tobacco Use  . Smoking status: Former Smoker    Types: Cigarettes  . Smokeless tobacco: Never Used  . Tobacco comment: 09/10/17 - As per pt, no longer smoking  Substance and Sexual Activity  . Alcohol use: No  . Drug use: No  . Sexual activity: Not on file  Other Topics Concern  . Not on file  Social History Narrative  . Not on file   Social Determinants of Health   Financial Resource Strain:   . Difficulty of Paying Living Expenses: Not on file  Food Insecurity:   . Worried About Charity fundraiser in the Last Year: Not on file  . Ran Out of Food in the Last Year: Not on file  Transportation Needs:   . Lack of Transportation (Medical): Not on file  . Lack of Transportation (Non-Medical): Not on file  Physical Activity:   . Days of Exercise per Week: Not on file  . Minutes of  Exercise per Session: Not on file  Stress:   . Feeling of Stress : Not on file  Social Connections:   . Frequency of Communication with Friends and Family: Not on file  . Frequency of Social Gatherings with Friends and Family: Not on file  . Attends Religious Services: Not on file  . Active Member of Clubs or Organizations: Not on file  . Attends Archivist Meetings: Not on file  . Marital Status: Not on file  Intimate Partner Violence:   . Fear of Current or Ex-Partner: Not on file  . Emotionally Abused: Not on file  . Physically Abused: Not on file  . Sexually Abused: Not on file    Family History  Problem Relation Age of Onset  . Hyperlipidemia Mother   . Hypertension Mother   . Diabetes Mother   . Hyperlipidemia Father   . Hypertension Father   . Cancer Sister     Current Outpatient Medications  Medication Sig Dispense Refill  . albuterol (PROVENTIL HFA;VENTOLIN HFA) 108 (90 Base) MCG/ACT inhaler Inhale 1-2 puffs into the lungs every 4 (four) hours as needed for wheezing or shortness of breath. 2 Inhaler 11  .  albuterol (PROVENTIL) (5 MG/ML) 0.5% nebulizer solution Take 0.5 mLs (2.5 mg total) by nebulization every 6 (six) hours as needed for wheezing or shortness of breath. 20 mL 1  . cyclobenzaprine (FLEXERIL) 5 MG tablet Take 1 tablet (5 mg total) by mouth 3 (three) times daily as needed for muscle spasms. 90 tablet 1  . divalproex (DEPAKOTE) 250 MG DR tablet TK 3 TS PO HS  0  . FLUoxetine (PROZAC) 20 MG capsule     . Fluticasone-Umeclidin-Vilant (TRELEGY ELLIPTA) 100-62.5-25 MCG/INH AEPB Inhale 1 Inhaler into the lungs daily. For chronic bronchitis / COPD 60 each 11  . hydrOXYzine (ATARAX/VISTARIL) 25 MG tablet Take 25 mg by mouth 2 (two) times daily as needed for anxiety.    . naproxen (NAPROSYN) 500 MG tablet Take 1 tablet (500 mg total) by mouth 2 (two) times daily as needed for moderate pain. 60 tablet 1  . triamterene-hydrochlorothiazide (DYAZIDE) 37.5-25 MG  capsule Take 1 each (1 capsule total) by mouth 2 (two) times daily. For high blood pressure 180 capsule 1   No current facility-administered medications for this visit.    Allergies  Allergen Reactions  . Erythromycin Anaphylaxis    Throat closes up, ling in mouth peals  . Nitrofurantoin Macrocrystal Anaphylaxis    Pt states her throat closes up  . Penicillins Anaphylaxis  . Colesevelam Hcl Other (See Comments)  . Doxycycline Rash  . Lamotrigine Rash     REVIEW OF SYSTEMS:   [X]  denotes positive finding, [ ]  denotes negative finding Cardiac  Comments:  Chest pain or chest pressure:    Shortness of breath upon exertion:    Short of breath when lying flat:    Irregular heart rhythm:        Vascular    Pain in calf, thigh, or hip brought on by ambulation:    Pain in feet at night that wakes you up from your sleep:     Blood clot in your veins:    Leg swelling:         Pulmonary    Oxygen at home:    Productive cough:     Wheezing:         Neurologic    Sudden weakness in arms or legs:     Sudden numbness in arms or legs:     Sudden onset of difficulty speaking or slurred speech:    Temporary loss of vision in one eye:     Problems with dizziness:         Gastrointestinal    Blood in stool:     Vomited blood:         Genitourinary    Burning when urinating:     Blood in urine:        Psychiatric    Major depression:         Hematologic    Bleeding problems:    Problems with blood clotting too easily:        Skin    Rashes or ulcers:        Constitutional    Fever or chills:      PHYSICAL EXAMINATION:  There were no vitals filed for this visit.  General:  WDWN in NAD; vital signs documented above Gait: Not observed HENT: WNL, normocephalic Pulmonary: normal non-labored breathing , without Rales, rhonchi,  wheezing Cardiac: {Desc; regular/irreg:14544} HR, without  Murmurs {With/Without:20273} carotid bruit*** Abdomen: soft, NT, no masses Skin:  {With/Without:20273} rashes Vascular Exam/Pulses:  Right Left  Radial {Exam; arterial pulse strength 0-4:30167} {Exam; arterial pulse strength 0-4:30167}  Ulnar {Exam; arterial pulse strength 0-4:30167} {Exam; arterial pulse strength 0-4:30167}  Femoral {Exam; arterial pulse strength 0-4:30167} {Exam; arterial pulse strength 0-4:30167}  Popliteal {Exam; arterial pulse strength 0-4:30167} {Exam; arterial pulse strength 0-4:30167}  DP {Exam; arterial pulse strength 0-4:30167} {Exam; arterial pulse strength 0-4:30167}  PT {Exam; arterial pulse strength 0-4:30167} {Exam; arterial pulse strength 0-4:30167}   Extremities: {With/Without:20273} ischemic changes, {With/Without:20273} Gangrene , {With/Without:20273} cellulitis; {With/Without:20273} open wounds;  Musculoskeletal: no muscle wasting or atrophy  Neurologic: A&O X 3;  No focal weakness or paresthesias are detected Psychiatric:  The pt has {Desc; normal/abnormal:11317::"Normal"} affect.   Non-Invasive Vascular Imaging:   ***    ASSESSMENT/PLAN:: 79 y.o. female here for follow up for ***   -***   Barbie Banner, PA-C [facility] MD:   ***

## 2019-02-18 DIAGNOSIS — K429 Umbilical hernia without obstruction or gangrene: Secondary | ICD-10-CM | POA: Diagnosis not present

## 2019-02-18 DIAGNOSIS — Z90710 Acquired absence of both cervix and uterus: Secondary | ICD-10-CM | POA: Diagnosis not present

## 2019-02-18 DIAGNOSIS — I714 Abdominal aortic aneurysm, without rupture: Secondary | ICD-10-CM | POA: Diagnosis not present

## 2019-02-18 DIAGNOSIS — R911 Solitary pulmonary nodule: Secondary | ICD-10-CM | POA: Diagnosis not present

## 2019-02-18 DIAGNOSIS — R918 Other nonspecific abnormal finding of lung field: Secondary | ICD-10-CM | POA: Diagnosis not present

## 2019-02-19 ENCOUNTER — Ambulatory Visit: Payer: Medicare Other

## 2019-02-19 ENCOUNTER — Other Ambulatory Visit (HOSPITAL_COMMUNITY): Payer: Medicare Other

## 2019-02-25 DIAGNOSIS — M533 Sacrococcygeal disorders, not elsewhere classified: Secondary | ICD-10-CM | POA: Diagnosis not present

## 2019-02-25 DIAGNOSIS — Z23 Encounter for immunization: Secondary | ICD-10-CM | POA: Diagnosis not present

## 2019-02-28 DIAGNOSIS — Z01818 Encounter for other preprocedural examination: Secondary | ICD-10-CM | POA: Diagnosis not present

## 2019-03-03 DIAGNOSIS — R918 Other nonspecific abnormal finding of lung field: Secondary | ICD-10-CM | POA: Diagnosis not present

## 2019-03-03 DIAGNOSIS — D381 Neoplasm of uncertain behavior of trachea, bronchus and lung: Secondary | ICD-10-CM | POA: Diagnosis not present

## 2019-03-03 DIAGNOSIS — J984 Other disorders of lung: Secondary | ICD-10-CM | POA: Diagnosis not present

## 2019-03-03 DIAGNOSIS — Z9889 Other specified postprocedural states: Secondary | ICD-10-CM | POA: Diagnosis not present

## 2019-03-04 DIAGNOSIS — R05 Cough: Secondary | ICD-10-CM | POA: Diagnosis not present

## 2019-03-04 DIAGNOSIS — F17210 Nicotine dependence, cigarettes, uncomplicated: Secondary | ICD-10-CM | POA: Diagnosis not present

## 2019-03-04 DIAGNOSIS — J3089 Other allergic rhinitis: Secondary | ICD-10-CM | POA: Diagnosis not present

## 2019-03-04 DIAGNOSIS — J441 Chronic obstructive pulmonary disease with (acute) exacerbation: Secondary | ICD-10-CM | POA: Diagnosis not present

## 2019-03-11 DIAGNOSIS — Z791 Long term (current) use of non-steroidal anti-inflammatories (NSAID): Secondary | ICD-10-CM | POA: Diagnosis not present

## 2019-03-11 DIAGNOSIS — F17200 Nicotine dependence, unspecified, uncomplicated: Secondary | ICD-10-CM | POA: Diagnosis not present

## 2019-03-11 DIAGNOSIS — R918 Other nonspecific abnormal finding of lung field: Secondary | ICD-10-CM | POA: Diagnosis not present

## 2019-03-11 DIAGNOSIS — Z7952 Long term (current) use of systemic steroids: Secondary | ICD-10-CM | POA: Diagnosis not present

## 2019-03-11 DIAGNOSIS — F319 Bipolar disorder, unspecified: Secondary | ICD-10-CM | POA: Diagnosis not present

## 2019-03-11 DIAGNOSIS — G4733 Obstructive sleep apnea (adult) (pediatric): Secondary | ICD-10-CM | POA: Diagnosis not present

## 2019-03-11 DIAGNOSIS — J3089 Other allergic rhinitis: Secondary | ICD-10-CM | POA: Diagnosis not present

## 2019-03-11 DIAGNOSIS — F17210 Nicotine dependence, cigarettes, uncomplicated: Secondary | ICD-10-CM | POA: Diagnosis not present

## 2019-03-11 DIAGNOSIS — I10 Essential (primary) hypertension: Secondary | ICD-10-CM | POA: Diagnosis not present

## 2019-03-11 DIAGNOSIS — Z7982 Long term (current) use of aspirin: Secondary | ICD-10-CM | POA: Diagnosis not present

## 2019-03-11 DIAGNOSIS — L299 Pruritus, unspecified: Secondary | ICD-10-CM | POA: Diagnosis not present

## 2019-03-11 DIAGNOSIS — J449 Chronic obstructive pulmonary disease, unspecified: Secondary | ICD-10-CM | POA: Diagnosis not present

## 2019-03-11 DIAGNOSIS — L821 Other seborrheic keratosis: Secondary | ICD-10-CM | POA: Diagnosis not present

## 2019-03-11 DIAGNOSIS — Z79899 Other long term (current) drug therapy: Secondary | ICD-10-CM | POA: Diagnosis not present

## 2019-03-11 DIAGNOSIS — L853 Xerosis cutis: Secondary | ICD-10-CM | POA: Diagnosis not present

## 2019-03-13 DIAGNOSIS — C3490 Malignant neoplasm of unspecified part of unspecified bronchus or lung: Secondary | ICD-10-CM | POA: Diagnosis not present

## 2019-03-13 DIAGNOSIS — I6782 Cerebral ischemia: Secondary | ICD-10-CM | POA: Diagnosis not present

## 2019-03-13 DIAGNOSIS — R918 Other nonspecific abnormal finding of lung field: Secondary | ICD-10-CM | POA: Diagnosis not present

## 2019-03-19 DIAGNOSIS — F17210 Nicotine dependence, cigarettes, uncomplicated: Secondary | ICD-10-CM | POA: Diagnosis present

## 2019-03-19 DIAGNOSIS — R072 Precordial pain: Secondary | ICD-10-CM | POA: Diagnosis not present

## 2019-03-19 DIAGNOSIS — R110 Nausea: Secondary | ICD-10-CM | POA: Diagnosis not present

## 2019-03-19 DIAGNOSIS — I2510 Atherosclerotic heart disease of native coronary artery without angina pectoris: Secondary | ICD-10-CM | POA: Diagnosis not present

## 2019-03-19 DIAGNOSIS — G4700 Insomnia, unspecified: Secondary | ICD-10-CM | POA: Diagnosis not present

## 2019-03-19 DIAGNOSIS — F5101 Primary insomnia: Secondary | ICD-10-CM | POA: Diagnosis not present

## 2019-03-19 DIAGNOSIS — F319 Bipolar disorder, unspecified: Secondary | ICD-10-CM | POA: Diagnosis not present

## 2019-03-19 DIAGNOSIS — R918 Other nonspecific abnormal finding of lung field: Secondary | ICD-10-CM | POA: Diagnosis present

## 2019-03-19 DIAGNOSIS — Z791 Long term (current) use of non-steroidal anti-inflammatories (NSAID): Secondary | ICD-10-CM | POA: Diagnosis not present

## 2019-03-19 DIAGNOSIS — I249 Acute ischemic heart disease, unspecified: Secondary | ICD-10-CM | POA: Diagnosis not present

## 2019-03-19 DIAGNOSIS — J449 Chronic obstructive pulmonary disease, unspecified: Secondary | ICD-10-CM | POA: Diagnosis not present

## 2019-03-19 DIAGNOSIS — Z7982 Long term (current) use of aspirin: Secondary | ICD-10-CM | POA: Diagnosis not present

## 2019-03-19 DIAGNOSIS — R9431 Abnormal electrocardiogram [ECG] [EKG]: Secondary | ICD-10-CM | POA: Diagnosis not present

## 2019-03-19 DIAGNOSIS — R079 Chest pain, unspecified: Secondary | ICD-10-CM | POA: Diagnosis not present

## 2019-03-19 DIAGNOSIS — R61 Generalized hyperhidrosis: Secondary | ICD-10-CM | POA: Diagnosis not present

## 2019-03-19 DIAGNOSIS — I080 Rheumatic disorders of both mitral and aortic valves: Secondary | ICD-10-CM | POA: Diagnosis not present

## 2019-03-19 DIAGNOSIS — I2584 Coronary atherosclerosis due to calcified coronary lesion: Secondary | ICD-10-CM | POA: Diagnosis not present

## 2019-03-19 DIAGNOSIS — C3411 Malignant neoplasm of upper lobe, right bronchus or lung: Secondary | ICD-10-CM | POA: Diagnosis not present

## 2019-03-19 DIAGNOSIS — I25110 Atherosclerotic heart disease of native coronary artery with unstable angina pectoris: Secondary | ICD-10-CM | POA: Diagnosis present

## 2019-03-19 DIAGNOSIS — I160 Hypertensive urgency: Secondary | ICD-10-CM | POA: Diagnosis not present

## 2019-03-19 DIAGNOSIS — M79601 Pain in right arm: Secondary | ICD-10-CM | POA: Diagnosis not present

## 2019-03-19 DIAGNOSIS — Z79899 Other long term (current) drug therapy: Secondary | ICD-10-CM | POA: Diagnosis not present

## 2019-03-19 DIAGNOSIS — I771 Stricture of artery: Secondary | ICD-10-CM | POA: Diagnosis not present

## 2019-03-19 DIAGNOSIS — F419 Anxiety disorder, unspecified: Secondary | ICD-10-CM | POA: Diagnosis not present

## 2019-03-19 DIAGNOSIS — R0789 Other chest pain: Secondary | ICD-10-CM | POA: Diagnosis not present

## 2019-03-19 DIAGNOSIS — I10 Essential (primary) hypertension: Secondary | ICD-10-CM | POA: Diagnosis present

## 2019-03-22 MED ORDER — DIPHENHYDRAMINE HCL 25 MG PO CAPS
25.00 | ORAL_CAPSULE | ORAL | Status: DC
Start: ? — End: 2019-03-22

## 2019-03-22 MED ORDER — GENERIC EXTERNAL MEDICATION
Status: DC
Start: ? — End: 2019-03-22

## 2019-03-22 MED ORDER — ALPRAZOLAM 0.25 MG PO TABS
0.25 | ORAL_TABLET | ORAL | Status: DC
Start: ? — End: 2019-03-22

## 2019-03-22 MED ORDER — ACETAMINOPHEN 325 MG PO TABS
650.00 | ORAL_TABLET | ORAL | Status: DC
Start: ? — End: 2019-03-22

## 2019-03-22 MED ORDER — NICOTINE 21 MG/24HR TD PT24
1.00 | MEDICATED_PATCH | TRANSDERMAL | Status: DC
Start: 2019-03-22 — End: 2019-03-22

## 2019-03-22 MED ORDER — TICAGRELOR 90 MG PO TABS
90.00 | ORAL_TABLET | ORAL | Status: DC
Start: 2019-03-21 — End: 2019-03-22

## 2019-03-22 MED ORDER — NITROGLYCERIN 0.4 MG SL SUBL
0.40 | SUBLINGUAL_TABLET | SUBLINGUAL | Status: DC
Start: ? — End: 2019-03-22

## 2019-03-22 MED ORDER — ATORVASTATIN CALCIUM 80 MG PO TABS
80.00 | ORAL_TABLET | ORAL | Status: DC
Start: 2019-03-20 — End: 2019-03-22

## 2019-03-22 MED ORDER — AMITRIPTYLINE HCL 25 MG PO TABS
25.00 | ORAL_TABLET | ORAL | Status: DC
Start: 2019-03-21 — End: 2019-03-22

## 2019-03-22 MED ORDER — ALUM & MAG HYDROXIDE-SIMETH 200-200-20 MG/5ML PO SUSP
30.00 | ORAL | Status: DC
Start: ? — End: 2019-03-22

## 2019-03-22 MED ORDER — CYCLOBENZAPRINE HCL 10 MG PO TABS
5.00 | ORAL_TABLET | ORAL | Status: DC
Start: ? — End: 2019-03-22

## 2019-03-22 MED ORDER — SENNOSIDES-DOCUSATE SODIUM 8.6-50 MG PO TABS
1.00 | ORAL_TABLET | ORAL | Status: DC
Start: ? — End: 2019-03-22

## 2019-03-22 MED ORDER — MONTELUKAST SODIUM 10 MG PO TABS
10.00 | ORAL_TABLET | ORAL | Status: DC
Start: 2019-03-22 — End: 2019-03-22

## 2019-03-22 MED ORDER — SODIUM CHLORIDE 0.9 % IV SOLN
100.00 | INTRAVENOUS | Status: DC
Start: ? — End: 2019-03-22

## 2019-03-22 MED ORDER — SODIUM CHLORIDE 0.9 % IV SOLN
10.00 | INTRAVENOUS | Status: DC
Start: ? — End: 2019-03-22

## 2019-03-22 MED ORDER — HYDROXYZINE PAMOATE 25 MG PO CAPS
25.00 | ORAL_CAPSULE | ORAL | Status: DC
Start: ? — End: 2019-03-22

## 2019-03-22 MED ORDER — CARVEDILOL 3.125 MG PO TABS
3.13 | ORAL_TABLET | ORAL | Status: DC
Start: 2019-03-21 — End: 2019-03-22

## 2019-03-22 MED ORDER — ASPIRIN 81 MG PO TBEC
81.00 | DELAYED_RELEASE_TABLET | ORAL | Status: DC
Start: 2019-03-22 — End: 2019-03-22

## 2019-03-22 MED ORDER — ONDANSETRON HCL 4 MG/2ML IJ SOLN
4.00 | INTRAMUSCULAR | Status: DC
Start: ? — End: 2019-03-22

## 2019-03-23 DIAGNOSIS — Z23 Encounter for immunization: Secondary | ICD-10-CM | POA: Diagnosis not present

## 2019-03-30 ENCOUNTER — Encounter: Payer: Self-pay | Admitting: Emergency Medicine

## 2019-03-30 ENCOUNTER — Other Ambulatory Visit: Payer: Self-pay

## 2019-03-30 ENCOUNTER — Emergency Department (INDEPENDENT_AMBULATORY_CARE_PROVIDER_SITE_OTHER)
Admission: EM | Admit: 2019-03-30 | Discharge: 2019-03-30 | Disposition: A | Discharge status: Home / Self Care | Payer: Medicare Other | Source: Home / Self Care | Attending: Family Medicine | Admitting: Family Medicine

## 2019-03-30 DIAGNOSIS — N3090 Cystitis, unspecified without hematuria: Secondary | ICD-10-CM

## 2019-03-30 DIAGNOSIS — R300 Dysuria: Secondary | ICD-10-CM | POA: Diagnosis not present

## 2019-03-30 MED ORDER — LEVOFLOXACIN 500 MG PO TABS: ORAL_TABLET | ORAL | 0 refills | Status: DC

## 2019-03-30 NOTE — ED Triage Notes (Signed)
Patient here for dysuria about 5 days; has been taking frequent doses of AZO. Reports having MI on 03/19/19. Has also been constipated and had 2 enemas earlier today. Mentioned wanting her ears cleaned out because they are itching; suggested PCP for numerous complaints outside of primary reason for today's visit. Has had both COVID vaccination doses.

## 2019-03-30 NOTE — ED Provider Notes (Signed)
[facility]    CSN: [id number] Arrival date & time: 03/30/19  1209      History   Chief Complaint Chief Complaint  Patient presents with  . Dysuria    HPI Anita Krause is a 79 y.o. female.   Patient complains of dysuria, urgency, and frequency for about 5 days.  She has been taking frequent doses of AZO.  She denies fevers, chills, and sweats and abdominal pain. She wonders if her ears might have cerumen impactions.     The history is provided by the patient.  Dysuria Pain quality:  Burning Pain severity:  Mild Onset quality:  Gradual Duration:  5 days Timing:  Constant Progression:  Unchanged Chronicity:  New Recent urinary tract infections: no   Relieved by:  Nothing Worsened by:  Nothing Ineffective treatments:  Phenazopyridine Urinary symptoms: discolored urine, frequent urination and hesitancy   Urinary symptoms: no foul-smelling urine, no hematuria and no bladder incontinence   Associated symptoms: no abdominal pain, no fever, no flank pain, no nausea and no vomiting   Risk factors: recurrent urinary tract infections     Past Medical History:  Diagnosis Date  . AAA (abdominal aortic aneurysm) without rupture ([place]) 05/03/2017   3.4 cm May 2019  . Bipolar 1 disorder ([place])   . 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  . Chronic back pain 03/14/2017  . Chronic bronchitis ([place]) 09/21/2016    Patient Active Problem List   Diagnosis Date Noted  . 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  . AAA (abdominal aortic aneurysm) without rupture ([place]) 05/03/2017  . Bursitis of hip, right 05/02/2017  . Chronic back pain 03/14/2017  . 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 Abdominal aortic atherosclerosis ([place]) 03/08/2017  . Bipolar 1 disorder, mixed, partial remission ([place]) 09/21/2016  . 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 History of hypertension 09/21/2016  . Tobacco abuse disorder 09/21/2016  . Chronic bronchitis ([place])  09/21/2016  . Functional memory problem 09/21/2016  . Muscle spasm 09/21/2016  . Anxiety 09/21/2016  . Adjustment disorder with disturbance of emotion 08/29/2016    Past Surgical History:  Procedure Laterality Date  . ABDOMINAL HYSTERECTOMY    . ANEURYSM COILING    . BACK SURGERY    . CARDIAC SURGERY    . POLYPECTOMY     VOCAL CORD    OB History   No obstetric history on file.      Home Medications    Prior to Admission medications   Medication Sig Start Date End Date Taking? Authorizing Provider  ticagrelor (BRILINTA) 90 MG TABS tablet Take by mouth. 03/21/19  Yes [provider]  albuterol (PROVENTIL HFA;VENTOLIN HFA) 108 (90 Base) MCG/ACT inhaler Inhale 1-2 puffs into the lungs every 4 (four) hours as needed for wheezing or shortness of breath. 03/07/17   Emeterio Reeve, DO  albuterol (PROVENTIL) (5 MG/ML) 0.5% nebulizer solution Take 0.5 mLs (2.5 mg total) by nebulization every 6 (six) hours as needed for wheezing or shortness of breath. 06/28/18   Varney Biles, MD  carvedilol (COREG) 3.125 MG tablet Take 3.125 mg by mouth 2 (two) times daily. 03/21/19   [provider]  cyclobenzaprine (FLEXERIL) 5 MG tablet Take 1 tablet (5 mg total) by mouth 3 (three) times daily as needed for muscle spasms. 01/16/19   Emeterio Reeve, DO  divalproex (DEPAKOTE) 250 MG DR tablet TK 3 TS PO HS 04/20/17   [provider]  FLUoxetine (PROZAC) 20 MG  capsule  07/10/18   [provider]  Fluticasone-Umeclidin-Vilant (TRELEGY ELLIPTA) 100-62.5-25 MCG/INH AEPB Inhale 1 Inhaler into the lungs daily. For chronic bronchitis / COPD 03/07/17   Emeterio Reeve, DO  hydrOXYzine (ATARAX/VISTARIL) 25 MG tablet Take 25 mg by mouth 2 (two) times daily as needed for anxiety.    [provider]  levofloxacin (LEVAQUIN) 500 MG tablet Take one tab by mouth once daily 03/30/19   Kandra Nicolas, MD  naproxen (NAPROSYN) 500 MG tablet Take 1 tablet (500 mg total) by  mouth 2 (two) times daily as needed for moderate pain. 01/16/19 01/16/20  Emeterio Reeve, DO  triamterene-hydrochlorothiazide (DYAZIDE) 37.5-25 MG capsule Take 1 each (1 capsule total) by mouth 2 (two) times daily. For high blood pressure 03/07/17   Emeterio Reeve, DO    Family History Family History  Problem Relation Age of Onset  . Hyperlipidemia Mother   . Hypertension Mother   . Diabetes Mother   . Hyperlipidemia Father   . Hypertension Father   . Cancer Sister     Social History Social History   Tobacco Use  . Smoking status: Former Smoker    Types: Cigarettes  . Smokeless tobacco: Never Used  . Tobacco comment: 09/10/17 - As per pt, no longer smoking  Substance Use Topics  . Alcohol use: No  . Drug use: No     Allergies   Erythromycin, Nitrofurantoin macrocrystal, Penicillins, Colesevelam hcl, Doxycycline, and Lamotrigine   Review of Systems Review of Systems  Constitutional: Negative for fever.  HENT:       Ears feel clogged without pain  Gastrointestinal: Negative for abdominal pain, nausea and vomiting.  Genitourinary: Positive for dysuria. Negative for flank pain.  All other systems reviewed and are negative.    Physical Exam Triage Vital Signs ED Triage Vitals  Enc Vitals Group     BP 03/30/19 1259 127/75     Pulse Rate 03/30/19 1259 (!) 102     Resp 03/30/19 1259 18     Temp 03/30/19 1259 99.6 F (37.6 C)     Temp Source 03/30/19 1259 Oral     SpO2 03/30/19 1259 95 %     Weight 03/30/19 1300 152 lb 1.9 oz (69 kg)     Height 03/30/19 1300 5\' 2"  (1.575 m)     Head Circumference --      Peak Flow --      Pain Score 03/30/19 1259 2     Pain Loc --      Pain Edu? --      Excl. in [place]? --    No data found.  Updated Vital Signs BP 127/75 (BP Location: Right Arm)   Pulse (!) 102   Temp 99.6 F (37.6 C) (Oral)   Resp 18   Ht 5\' 2"  (1.575 m)   Wt 69 kg   SpO2 95%   BMI 27.82 kg/m   Visual Acuity Right Eye Distance:   Left Eye  Distance:   Bilateral Distance:    Right Eye Near:   Left Eye Near:    Bilateral Near:     Physical Exam Nursing notes and Vital Signs reviewed. Appearance:  Patient appears stated age, and in no acute distress.    Eyes:  Pupils are equal, round, and reactive to light and accomodation.  Extraocular movement is intact.  Conjunctivae are not inflamed. Ears:  Normal canals bilaterally.  No cerumen impactions.  Tympanic membranes normal bilaterally. Pharynx:  Normal; moist mucous membranes  Neck:  Supple.  No adenopathy Lungs:  Clear to auscultation.  Breath sounds are equal.  Moving air well. Heart:  Regular rate and rhythm without murmurs, rubs, or gallops.  Abdomen:  Nontender without masses or hepatosplenomegaly.  Bowel sounds are present.  No CVA or flank tenderness.  Extremities:  No edema.  Skin:  No rash present.     UC Treatments / Results  Labs (all labs ordered are listed, but only abnormal results are displayed) Labs Reviewed  URINE CULTURE    EKG   Radiology No results found.  Procedures Procedures (including critical care time)  Medications Ordered in UC Medications - No data to display  Initial Impression / Assessment and Plan / UC Course  I have reviewed the triage vital signs and the nursing notes.  Pertinent labs & imaging results that were available during my care of the patient were reviewed by me and considered in my medical decision making (see chart for details).    Patient has been taking AZO; unable to evaluate urinalysis. Urine culture pending. Patient has multiple antibiotic allergies; will begin Levaquin 500mg  daily for 5 days (she has tolerated well in the past). Followup with Family Doctor  Final Clinical Impressions(s) / UC Diagnoses   Final diagnoses:  Dysuria  Cystitis     Discharge Instructions     Increase fluid intake. May use non-prescription AZO for about two days, if desired, to decrease urinary discomfort.   If  symptoms become significantly worse during the night or over the weekend, proceed to the local emergency room.     ED Prescriptions    Medication Sig Dispense Auth. Provider   levofloxacin (LEVAQUIN) 500 MG tablet Take one tab by mouth once daily 5 tablet Kandra Nicolas, MD        Kandra Nicolas, MD 03/30/19 [phone]

## 2019-03-30 NOTE — Discharge Instructions (Addendum)
Increase fluid intake. May use non-prescription AZO for about two days, if desired, to decrease urinary discomfort.  If symptoms become significantly worse during the night or over the weekend, proceed to the local emergency room.  

## 2019-04-01 LAB — URINE CULTURE
MICRO NUMBER:: 10301593
SPECIMEN QUALITY:: ADEQUATE

## 2019-04-03 DIAGNOSIS — J441 Chronic obstructive pulmonary disease with (acute) exacerbation: Secondary | ICD-10-CM | POA: Diagnosis not present

## 2019-04-03 DIAGNOSIS — I10 Essential (primary) hypertension: Secondary | ICD-10-CM | POA: Diagnosis not present

## 2019-04-03 DIAGNOSIS — R0600 Dyspnea, unspecified: Secondary | ICD-10-CM | POA: Diagnosis not present

## 2019-04-03 DIAGNOSIS — F17210 Nicotine dependence, cigarettes, uncomplicated: Secondary | ICD-10-CM | POA: Diagnosis not present

## 2019-04-14 DIAGNOSIS — C3411 Malignant neoplasm of upper lobe, right bronchus or lung: Secondary | ICD-10-CM | POA: Diagnosis not present

## 2019-04-14 DIAGNOSIS — Z510 Encounter for antineoplastic radiation therapy: Secondary | ICD-10-CM | POA: Diagnosis not present

## 2019-04-14 DIAGNOSIS — F17210 Nicotine dependence, cigarettes, uncomplicated: Secondary | ICD-10-CM | POA: Diagnosis not present

## 2019-04-15 DIAGNOSIS — C3411 Malignant neoplasm of upper lobe, right bronchus or lung: Secondary | ICD-10-CM | POA: Diagnosis not present

## 2019-04-15 DIAGNOSIS — Z510 Encounter for antineoplastic radiation therapy: Secondary | ICD-10-CM | POA: Diagnosis not present

## 2019-04-17 DIAGNOSIS — I10 Essential (primary) hypertension: Secondary | ICD-10-CM | POA: Diagnosis not present

## 2019-04-17 DIAGNOSIS — G4733 Obstructive sleep apnea (adult) (pediatric): Secondary | ICD-10-CM | POA: Diagnosis not present

## 2019-04-17 DIAGNOSIS — J3089 Other allergic rhinitis: Secondary | ICD-10-CM | POA: Diagnosis not present

## 2019-04-17 DIAGNOSIS — F17210 Nicotine dependence, cigarettes, uncomplicated: Secondary | ICD-10-CM | POA: Diagnosis not present

## 2019-04-17 DIAGNOSIS — J449 Chronic obstructive pulmonary disease, unspecified: Secondary | ICD-10-CM | POA: Diagnosis not present

## 2019-04-23 DIAGNOSIS — C3411 Malignant neoplasm of upper lobe, right bronchus or lung: Secondary | ICD-10-CM | POA: Diagnosis not present

## 2019-04-23 DIAGNOSIS — F17210 Nicotine dependence, cigarettes, uncomplicated: Secondary | ICD-10-CM | POA: Diagnosis not present

## 2019-04-23 DIAGNOSIS — Z510 Encounter for antineoplastic radiation therapy: Secondary | ICD-10-CM | POA: Diagnosis not present

## 2019-04-28 DIAGNOSIS — C3411 Malignant neoplasm of upper lobe, right bronchus or lung: Secondary | ICD-10-CM | POA: Diagnosis not present

## 2019-04-28 DIAGNOSIS — F17210 Nicotine dependence, cigarettes, uncomplicated: Secondary | ICD-10-CM | POA: Diagnosis not present

## 2019-04-28 DIAGNOSIS — Z510 Encounter for antineoplastic radiation therapy: Secondary | ICD-10-CM | POA: Diagnosis not present

## 2019-04-29 DIAGNOSIS — G4733 Obstructive sleep apnea (adult) (pediatric): Secondary | ICD-10-CM | POA: Diagnosis not present

## 2019-04-29 DIAGNOSIS — C3411 Malignant neoplasm of upper lobe, right bronchus or lung: Secondary | ICD-10-CM | POA: Diagnosis not present

## 2019-04-29 DIAGNOSIS — Z510 Encounter for antineoplastic radiation therapy: Secondary | ICD-10-CM | POA: Diagnosis not present

## 2019-04-29 DIAGNOSIS — F17200 Nicotine dependence, unspecified, uncomplicated: Secondary | ICD-10-CM | POA: Diagnosis not present

## 2019-04-29 DIAGNOSIS — J449 Chronic obstructive pulmonary disease, unspecified: Secondary | ICD-10-CM | POA: Diagnosis not present

## 2019-04-30 DIAGNOSIS — C3411 Malignant neoplasm of upper lobe, right bronchus or lung: Secondary | ICD-10-CM | POA: Diagnosis not present

## 2019-04-30 DIAGNOSIS — Z510 Encounter for antineoplastic radiation therapy: Secondary | ICD-10-CM | POA: Diagnosis not present

## 2019-05-01 DIAGNOSIS — C3411 Malignant neoplasm of upper lobe, right bronchus or lung: Secondary | ICD-10-CM | POA: Diagnosis not present

## 2019-05-01 DIAGNOSIS — Z510 Encounter for antineoplastic radiation therapy: Secondary | ICD-10-CM | POA: Diagnosis not present

## 2019-05-02 DIAGNOSIS — C3411 Malignant neoplasm of upper lobe, right bronchus or lung: Secondary | ICD-10-CM | POA: Diagnosis not present

## 2019-05-02 DIAGNOSIS — Z510 Encounter for antineoplastic radiation therapy: Secondary | ICD-10-CM | POA: Diagnosis not present

## 2019-05-02 DIAGNOSIS — F17210 Nicotine dependence, cigarettes, uncomplicated: Secondary | ICD-10-CM | POA: Diagnosis not present

## 2019-05-05 DIAGNOSIS — Z510 Encounter for antineoplastic radiation therapy: Secondary | ICD-10-CM | POA: Diagnosis not present

## 2019-05-05 DIAGNOSIS — C3411 Malignant neoplasm of upper lobe, right bronchus or lung: Secondary | ICD-10-CM | POA: Diagnosis not present

## 2019-05-06 ENCOUNTER — Other Ambulatory Visit: Payer: Self-pay

## 2019-05-06 ENCOUNTER — Emergency Department (INDEPENDENT_AMBULATORY_CARE_PROVIDER_SITE_OTHER)
Admission: EM | Admit: 2019-05-06 | Discharge: 2019-05-06 | Disposition: A | Discharge status: Home / Self Care | Payer: Medicare Other | Source: Home / Self Care | Attending: Family Medicine | Admitting: Family Medicine

## 2019-05-06 DIAGNOSIS — R300 Dysuria: Secondary | ICD-10-CM

## 2019-05-06 DIAGNOSIS — Z955 Presence of coronary angioplasty implant and graft: Secondary | ICD-10-CM | POA: Diagnosis not present

## 2019-05-06 DIAGNOSIS — Z510 Encounter for antineoplastic radiation therapy: Secondary | ICD-10-CM | POA: Diagnosis not present

## 2019-05-06 DIAGNOSIS — I2510 Atherosclerotic heart disease of native coronary artery without angina pectoris: Secondary | ICD-10-CM | POA: Diagnosis not present

## 2019-05-06 DIAGNOSIS — C3411 Malignant neoplasm of upper lobe, right bronchus or lung: Secondary | ICD-10-CM | POA: Diagnosis not present

## 2019-05-06 DIAGNOSIS — I10 Essential (primary) hypertension: Secondary | ICD-10-CM | POA: Diagnosis not present

## 2019-05-06 DIAGNOSIS — N3090 Cystitis, unspecified without hematuria: Secondary | ICD-10-CM

## 2019-05-06 MED ORDER — ONDANSETRON 4 MG PO TBDP: ORAL_TABLET | ORAL | 0 refills | Status: DC

## 2019-05-06 MED ORDER — LEVOFLOXACIN 500 MG PO TABS: ORAL_TABLET | ORAL | 0 refills | Status: DC

## 2019-05-06 NOTE — ED Provider Notes (Signed)
[name]    CSN: [id number] Arrival date & time: 05/06/19  1441      History   Chief Complaint Chief Complaint  Patient presents with  . Dysuria  . Back Pain    HPI Anita Krause is a 79 y.o. female.   Patient complains of dysuria, frequency, and mild low back pain for four days.  She has been taking AZO daily.    The history is provided by the patient.  Dysuria Pain quality:  Burning Pain severity:  Moderate Onset quality:  Sudden Duration:  4 days Timing:  Constant Progression:  Worsening Chronicity:  Recurrent Recent urinary tract infections: yes   Relieved by:  Nothing Worsened by:  Nothing Ineffective treatments:  Phenazopyridine Urinary symptoms: frequent urination and hesitancy   Urinary symptoms: no discolored urine, no foul-smelling urine, no hematuria and no bladder incontinence   Associated symptoms: nausea   Associated symptoms: no abdominal pain, no fever, no flank pain and no vomiting   Risk factors: recurrent urinary tract infections     Past Medical History:  Diagnosis Date  . AAA (abdominal aortic aneurysm) without rupture ([place]) 05/03/2017   3.4 cm May 2019  . Bipolar 1 disorder ([place])   . 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  . Chronic back pain 03/14/2017  . Chronic bronchitis ([place]) 09/21/2016    Patient Active Problem List   Diagnosis Date Noted  . 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  . AAA (abdominal aortic aneurysm) without rupture ([place]) 05/03/2017  . Bursitis of hip, right 05/02/2017  . Chronic back pain 03/14/2017  . 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 Abdominal aortic atherosclerosis ([place]) 03/08/2017  . Bipolar 1 disorder, mixed, partial remission ([place]) 09/21/2016  . 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 History of hypertension 09/21/2016  . Tobacco abuse disorder 09/21/2016  . Chronic bronchitis ([place]) 09/21/2016  . Functional memory problem 09/21/2016  . Muscle spasm 09/21/2016   . Anxiety 09/21/2016  . Adjustment disorder with disturbance of emotion 08/29/2016    Past Surgical History:  Procedure Laterality Date  . ABDOMINAL HYSTERECTOMY    . ANEURYSM COILING    . BACK SURGERY    . CARDIAC SURGERY    . POLYPECTOMY     VOCAL CORD    OB History   No obstetric history on file.      Home Medications    Prior to Admission medications   Medication Sig Start Date End Date Taking? Authorizing Provider  albuterol (PROVENTIL HFA;VENTOLIN HFA) 108 (90 Base) MCG/ACT inhaler Inhale 1-2 puffs into the lungs every 4 (four) hours as needed for wheezing or shortness of breath. 03/07/17   Emeterio Reeve, DO  albuterol (PROVENTIL) (5 MG/ML) 0.5% nebulizer solution Take 0.5 mLs (2.5 mg total) by nebulization every 6 (six) hours as needed for wheezing or shortness of breath. 06/28/18   Varney Biles, MD  carvedilol (COREG) 3.125 MG tablet Take 3.125 mg by mouth 2 (two) times daily. 03/21/19   [provider]  cyclobenzaprine (FLEXERIL) 5 MG tablet Take 1 tablet (5 mg total) by mouth 3 (three) times daily as needed for muscle spasms. 01/16/19   Emeterio Reeve, DO  divalproex (DEPAKOTE) 250 MG DR tablet TK 3 TS PO HS 04/20/17   [provider]  FLUoxetine (PROZAC) 20 MG capsule  07/10/18   [provider]  Fluticasone-Umeclidin-Vilant (TRELEGY ELLIPTA) 100-62.5-25 MCG/INH AEPB Inhale 1 Inhaler into the lungs daily. For chronic bronchitis / COPD 03/07/17  Emeterio Reeve, DO  hydrOXYzine (ATARAX/VISTARIL) 25 MG tablet Take 25 mg by mouth 2 (two) times daily as needed for anxiety.    [provider]  levofloxacin (LEVAQUIN) 500 MG tablet Take one tab by mouth once daily 05/06/19   Kandra Nicolas, MD  naproxen (NAPROSYN) 500 MG tablet Take 1 tablet (500 mg total) by mouth 2 (two) times daily as needed for moderate pain. 01/16/19 01/16/20  Emeterio Reeve, DO  ondansetron (ZOFRAN ODT) 4 MG disintegrating tablet Take one tab by mouth Q6hr prn  nausea.  Dissolve under tongue. 05/06/19   Kandra Nicolas, MD  ticagrelor (BRILINTA) 90 MG TABS tablet Take by mouth. 03/21/19   [provider]  triamterene-hydrochlorothiazide (DYAZIDE) 37.5-25 MG capsule Take 1 each (1 capsule total) by mouth 2 (two) times daily. For high blood pressure 03/07/17   Emeterio Reeve, DO    Family History Family History  Problem Relation Age of Onset  . Hyperlipidemia Mother   . Hypertension Mother   . Diabetes Mother   . Hyperlipidemia Father   . Hypertension Father   . Cancer Sister     Social History Social History   Tobacco Use  . Smoking status: Former Smoker    Types: Cigarettes  . Smokeless tobacco: Never Used  . Tobacco comment: 09/10/17 - As per pt, no longer smoking  Substance Use Topics  . Alcohol use: No  . Drug use: No     Allergies   Erythromycin, Nitrofurantoin macrocrystal, Penicillins, Colesevelam hcl, Doxycycline, and Lamotrigine   Review of Systems Review of Systems  Constitutional: Negative for chills, diaphoresis and fever.  Gastrointestinal: Positive for nausea. Negative for abdominal pain and vomiting.  Genitourinary: Positive for dysuria, frequency and urgency. Negative for flank pain.  All other systems reviewed and are negative.    Physical Exam Triage Vital Signs ED Triage Vitals  Enc Vitals Group     BP 05/06/19 1453 113/70     Pulse Rate 05/06/19 1453 (!) 111     Resp 05/06/19 1453 18     Temp 05/06/19 1453 98.6 F (37 C)     Temp Source 05/06/19 1453 Oral     SpO2 05/06/19 1453 94 %     Weight --      Height --      Head Circumference --      Peak Flow --      Pain Score 05/06/19 1454 0     Pain Loc --      Pain Edu? --      Excl. in [place]? --    No data found.  Updated Vital Signs BP 113/70 (BP Location: Right Arm)   Pulse (!) 111   Temp 98.6 F (37 C) (Oral)   Resp 18   SpO2 94%   Visual Acuity Right Eye Distance:   Left Eye Distance:   Bilateral Distance:    Right Eye  Near:   Left Eye Near:    Bilateral Near:     Physical Exam Nursing notes and Vital Signs reviewed. Appearance:  Patient appears stated age, and in no acute distress.    Eyes:  Pupils are equal, round, and reactive to light and accomodation.  Extraocular movement is intact.  Conjunctivae are not inflamed   Pharynx:  Normal; moist mucous membranes  Neck:  Supple.  No adenopathy Lungs:  Clear to auscultation.  Breath sounds are equal.  Moving air well. Heart:  Regular rate and rhythm without murmurs, rubs, or gallops.  Abdomen:  Nontender without masses or hepatosplenomegaly.  Bowel sounds are present.  No CVA or flank tenderness.  Extremities:  No edema.  Skin:  No rash present.     UC Treatments / Results  Labs (all labs ordered are listed, but only abnormal results are displayed) Labs Reviewed  URINE CULTURE    EKG   Radiology No results found.  Procedures Procedures (including critical care time)  Medications Ordered in UC Medications - No data to display  Initial Impression / Assessment and Plan / UC Course  I have reviewed the triage vital signs and the nursing notes.  Pertinent labs & imaging results that were available during my care of the patient were reviewed by me and considered in my medical decision making (see chart for details).    Patient requests previous antibiotic that was prescribed for UTI in March (Levaquin). Begin empiric Levaquin.  Urine culture pending. Rx for Zofran ODT 4mg  at patient's request. Recommend follow-up with urologist.   Final Clinical Impressions(s) / UC Diagnoses   Final diagnoses:  Dysuria  Cystitis     Discharge Instructions     Increase fluid intake.  If symptoms become significantly worse during the night or over the weekend, proceed to the local emergency room.     ED Prescriptions    Medication Sig Dispense Auth. Provider   levofloxacin (LEVAQUIN) 500 MG tablet Take one tab by mouth once daily 5 tablet Kandra Nicolas, MD   ondansetron (ZOFRAN ODT) 4 MG disintegrating tablet Take one tab by mouth Q6hr prn nausea.  Dissolve under tongue. 12 tablet Kandra Nicolas, MD        Kandra Nicolas, MD 05/07/19 [phone]

## 2019-05-06 NOTE — ED Triage Notes (Signed)
Pt c/o dysuria x 4 days. Urinary frequency and some low back pain. Taking azo daily.

## 2019-05-06 NOTE — Discharge Instructions (Signed)
Increase fluid intake. °If symptoms become significantly worse during the night or over the weekend, proceed to the local emergency room.  °

## 2019-05-07 DIAGNOSIS — Z510 Encounter for antineoplastic radiation therapy: Secondary | ICD-10-CM | POA: Diagnosis not present

## 2019-05-07 DIAGNOSIS — C3411 Malignant neoplasm of upper lobe, right bronchus or lung: Secondary | ICD-10-CM | POA: Diagnosis not present

## 2019-05-07 LAB — URINE CULTURE
MICRO NUMBER:: 10437754
SPECIMEN QUALITY:: ADEQUATE

## 2019-05-08 DIAGNOSIS — C3411 Malignant neoplasm of upper lobe, right bronchus or lung: Secondary | ICD-10-CM | POA: Diagnosis not present

## 2019-05-08 DIAGNOSIS — Z510 Encounter for antineoplastic radiation therapy: Secondary | ICD-10-CM | POA: Diagnosis not present

## 2019-05-09 DIAGNOSIS — Z9114 Patient's other noncompliance with medication regimen: Secondary | ICD-10-CM | POA: Diagnosis not present

## 2019-05-09 DIAGNOSIS — F17210 Nicotine dependence, cigarettes, uncomplicated: Secondary | ICD-10-CM | POA: Diagnosis not present

## 2019-05-09 DIAGNOSIS — F17200 Nicotine dependence, unspecified, uncomplicated: Secondary | ICD-10-CM | POA: Diagnosis not present

## 2019-05-09 DIAGNOSIS — C3411 Malignant neoplasm of upper lobe, right bronchus or lung: Secondary | ICD-10-CM | POA: Diagnosis not present

## 2019-05-09 DIAGNOSIS — F5101 Primary insomnia: Secondary | ICD-10-CM | POA: Diagnosis not present

## 2019-05-09 DIAGNOSIS — Z510 Encounter for antineoplastic radiation therapy: Secondary | ICD-10-CM | POA: Diagnosis not present

## 2019-05-09 DIAGNOSIS — F411 Generalized anxiety disorder: Secondary | ICD-10-CM | POA: Diagnosis not present

## 2019-05-09 DIAGNOSIS — F319 Bipolar disorder, unspecified: Secondary | ICD-10-CM | POA: Diagnosis not present

## 2019-05-12 DIAGNOSIS — C3411 Malignant neoplasm of upper lobe, right bronchus or lung: Secondary | ICD-10-CM | POA: Diagnosis not present

## 2019-05-12 DIAGNOSIS — Z510 Encounter for antineoplastic radiation therapy: Secondary | ICD-10-CM | POA: Diagnosis not present

## 2019-05-13 DIAGNOSIS — Z510 Encounter for antineoplastic radiation therapy: Secondary | ICD-10-CM | POA: Diagnosis not present

## 2019-05-13 DIAGNOSIS — C3411 Malignant neoplasm of upper lobe, right bronchus or lung: Secondary | ICD-10-CM | POA: Diagnosis not present

## 2019-05-14 DIAGNOSIS — Z510 Encounter for antineoplastic radiation therapy: Secondary | ICD-10-CM | POA: Diagnosis not present

## 2019-05-14 DIAGNOSIS — C3411 Malignant neoplasm of upper lobe, right bronchus or lung: Secondary | ICD-10-CM | POA: Diagnosis not present

## 2019-05-15 DIAGNOSIS — Z510 Encounter for antineoplastic radiation therapy: Secondary | ICD-10-CM | POA: Diagnosis not present

## 2019-05-15 DIAGNOSIS — C3411 Malignant neoplasm of upper lobe, right bronchus or lung: Secondary | ICD-10-CM | POA: Diagnosis not present

## 2019-05-16 DIAGNOSIS — Z510 Encounter for antineoplastic radiation therapy: Secondary | ICD-10-CM | POA: Diagnosis not present

## 2019-05-16 DIAGNOSIS — F17210 Nicotine dependence, cigarettes, uncomplicated: Secondary | ICD-10-CM | POA: Diagnosis not present

## 2019-05-16 DIAGNOSIS — C3411 Malignant neoplasm of upper lobe, right bronchus or lung: Secondary | ICD-10-CM | POA: Diagnosis not present

## 2019-05-19 DIAGNOSIS — C3411 Malignant neoplasm of upper lobe, right bronchus or lung: Secondary | ICD-10-CM | POA: Diagnosis not present

## 2019-05-19 DIAGNOSIS — Z510 Encounter for antineoplastic radiation therapy: Secondary | ICD-10-CM | POA: Diagnosis not present

## 2019-05-20 DIAGNOSIS — Z510 Encounter for antineoplastic radiation therapy: Secondary | ICD-10-CM | POA: Diagnosis not present

## 2019-05-20 DIAGNOSIS — C3411 Malignant neoplasm of upper lobe, right bronchus or lung: Secondary | ICD-10-CM | POA: Diagnosis not present

## 2019-05-21 DIAGNOSIS — R350 Frequency of micturition: Secondary | ICD-10-CM | POA: Diagnosis not present

## 2019-05-21 DIAGNOSIS — Z510 Encounter for antineoplastic radiation therapy: Secondary | ICD-10-CM | POA: Diagnosis not present

## 2019-05-21 DIAGNOSIS — C3411 Malignant neoplasm of upper lobe, right bronchus or lung: Secondary | ICD-10-CM | POA: Diagnosis not present

## 2019-05-22 DIAGNOSIS — Z510 Encounter for antineoplastic radiation therapy: Secondary | ICD-10-CM | POA: Diagnosis not present

## 2019-05-22 DIAGNOSIS — R350 Frequency of micturition: Secondary | ICD-10-CM | POA: Diagnosis not present

## 2019-05-22 DIAGNOSIS — C3411 Malignant neoplasm of upper lobe, right bronchus or lung: Secondary | ICD-10-CM | POA: Diagnosis not present

## 2019-05-23 DIAGNOSIS — Z510 Encounter for antineoplastic radiation therapy: Secondary | ICD-10-CM | POA: Diagnosis not present

## 2019-05-23 DIAGNOSIS — F17210 Nicotine dependence, cigarettes, uncomplicated: Secondary | ICD-10-CM | POA: Diagnosis not present

## 2019-05-23 DIAGNOSIS — C3411 Malignant neoplasm of upper lobe, right bronchus or lung: Secondary | ICD-10-CM | POA: Diagnosis not present

## 2019-05-26 DIAGNOSIS — Z510 Encounter for antineoplastic radiation therapy: Secondary | ICD-10-CM | POA: Diagnosis not present

## 2019-05-26 DIAGNOSIS — C3411 Malignant neoplasm of upper lobe, right bronchus or lung: Secondary | ICD-10-CM | POA: Diagnosis not present

## 2019-05-27 DIAGNOSIS — C3411 Malignant neoplasm of upper lobe, right bronchus or lung: Secondary | ICD-10-CM | POA: Diagnosis not present

## 2019-05-27 DIAGNOSIS — Z510 Encounter for antineoplastic radiation therapy: Secondary | ICD-10-CM | POA: Diagnosis not present

## 2019-05-27 DIAGNOSIS — R300 Dysuria: Secondary | ICD-10-CM | POA: Diagnosis not present

## 2019-05-27 DIAGNOSIS — Z9889 Other specified postprocedural states: Secondary | ICD-10-CM | POA: Diagnosis not present

## 2019-05-28 DIAGNOSIS — C3411 Malignant neoplasm of upper lobe, right bronchus or lung: Secondary | ICD-10-CM | POA: Diagnosis not present

## 2019-05-28 DIAGNOSIS — Z510 Encounter for antineoplastic radiation therapy: Secondary | ICD-10-CM | POA: Diagnosis not present

## 2019-05-29 DIAGNOSIS — C3411 Malignant neoplasm of upper lobe, right bronchus or lung: Secondary | ICD-10-CM | POA: Diagnosis not present

## 2019-05-29 DIAGNOSIS — Z510 Encounter for antineoplastic radiation therapy: Secondary | ICD-10-CM | POA: Diagnosis not present

## 2019-05-30 DIAGNOSIS — C3411 Malignant neoplasm of upper lobe, right bronchus or lung: Secondary | ICD-10-CM | POA: Diagnosis not present

## 2019-05-30 DIAGNOSIS — Z510 Encounter for antineoplastic radiation therapy: Secondary | ICD-10-CM | POA: Diagnosis not present

## 2019-05-30 DIAGNOSIS — F17210 Nicotine dependence, cigarettes, uncomplicated: Secondary | ICD-10-CM | POA: Diagnosis not present

## 2019-06-03 DIAGNOSIS — C3411 Malignant neoplasm of upper lobe, right bronchus or lung: Secondary | ICD-10-CM | POA: Diagnosis not present

## 2019-06-03 DIAGNOSIS — Z510 Encounter for antineoplastic radiation therapy: Secondary | ICD-10-CM | POA: Diagnosis not present

## 2019-06-18 DIAGNOSIS — Z79899 Other long term (current) drug therapy: Secondary | ICD-10-CM | POA: Diagnosis not present

## 2019-06-18 DIAGNOSIS — C3411 Malignant neoplasm of upper lobe, right bronchus or lung: Secondary | ICD-10-CM | POA: Diagnosis not present

## 2019-06-18 DIAGNOSIS — Z87891 Personal history of nicotine dependence: Secondary | ICD-10-CM | POA: Diagnosis not present

## 2019-06-18 DIAGNOSIS — J449 Chronic obstructive pulmonary disease, unspecified: Secondary | ICD-10-CM | POA: Diagnosis not present

## 2019-06-27 DIAGNOSIS — Z955 Presence of coronary angioplasty implant and graft: Secondary | ICD-10-CM | POA: Diagnosis not present

## 2019-06-27 DIAGNOSIS — I10 Essential (primary) hypertension: Secondary | ICD-10-CM | POA: Diagnosis not present

## 2019-06-27 DIAGNOSIS — I2510 Atherosclerotic heart disease of native coronary artery without angina pectoris: Secondary | ICD-10-CM | POA: Diagnosis not present

## 2019-07-03 DIAGNOSIS — I2510 Atherosclerotic heart disease of native coronary artery without angina pectoris: Secondary | ICD-10-CM | POA: Diagnosis not present

## 2019-07-03 DIAGNOSIS — Z955 Presence of coronary angioplasty implant and graft: Secondary | ICD-10-CM | POA: Diagnosis not present

## 2019-07-03 DIAGNOSIS — I10 Essential (primary) hypertension: Secondary | ICD-10-CM | POA: Diagnosis not present

## 2019-07-04 ENCOUNTER — Encounter: Payer: Self-pay | Admitting: Osteopathic Medicine

## 2019-07-17 DIAGNOSIS — F17200 Nicotine dependence, unspecified, uncomplicated: Secondary | ICD-10-CM | POA: Diagnosis not present

## 2019-07-17 DIAGNOSIS — J441 Chronic obstructive pulmonary disease with (acute) exacerbation: Secondary | ICD-10-CM | POA: Diagnosis not present

## 2019-07-17 DIAGNOSIS — C3411 Malignant neoplasm of upper lobe, right bronchus or lung: Secondary | ICD-10-CM | POA: Diagnosis not present

## 2019-07-17 DIAGNOSIS — G4733 Obstructive sleep apnea (adult) (pediatric): Secondary | ICD-10-CM | POA: Diagnosis not present

## 2019-08-05 ENCOUNTER — Telehealth: Payer: Self-pay

## 2019-08-05 NOTE — Telephone Encounter (Signed)
Patient called into office wanting order for MRI for pain management, Please advise

## 2019-08-05 NOTE — Telephone Encounter (Signed)
She needs to ask her pain management specialsit about tests the yare ordering, not me

## 2019-08-06 NOTE — Telephone Encounter (Signed)
Patient contacted. Left detailed message on patients voicemail advising her to contact her pain management office for MRI order.

## 2019-08-19 ENCOUNTER — Encounter: Payer: Medicare Other | Admitting: Medical-Surgical

## 2019-08-26 DIAGNOSIS — M47816 Spondylosis without myelopathy or radiculopathy, lumbar region: Secondary | ICD-10-CM | POA: Diagnosis not present

## 2019-08-26 DIAGNOSIS — M7061 Trochanteric bursitis, right hip: Secondary | ICD-10-CM | POA: Diagnosis not present

## 2019-08-26 DIAGNOSIS — M961 Postlaminectomy syndrome, not elsewhere classified: Secondary | ICD-10-CM | POA: Diagnosis not present

## 2019-08-26 DIAGNOSIS — M5416 Radiculopathy, lumbar region: Secondary | ICD-10-CM | POA: Diagnosis not present

## 2019-09-04 ENCOUNTER — Ambulatory Visit (INDEPENDENT_AMBULATORY_CARE_PROVIDER_SITE_OTHER): Payer: Medicare Other | Admitting: Medical-Surgical

## 2019-09-04 ENCOUNTER — Other Ambulatory Visit: Payer: Self-pay

## 2019-09-04 ENCOUNTER — Encounter: Payer: Self-pay | Admitting: Medical-Surgical

## 2019-09-04 VITALS — BP 129/66 | HR 76 | Temp 98.0°F | Ht 64.0 in | Wt 161.1 lb

## 2019-09-04 DIAGNOSIS — Z1211 Encounter for screening for malignant neoplasm of colon: Secondary | ICD-10-CM | POA: Diagnosis not present

## 2019-09-04 DIAGNOSIS — L728 Other follicular cysts of the skin and subcutaneous tissue: Secondary | ICD-10-CM | POA: Diagnosis not present

## 2019-09-04 DIAGNOSIS — Z0000 Encounter for general adult medical examination without abnormal findings: Secondary | ICD-10-CM | POA: Diagnosis not present

## 2019-09-04 DIAGNOSIS — L820 Inflamed seborrheic keratosis: Secondary | ICD-10-CM | POA: Diagnosis not present

## 2019-09-04 DIAGNOSIS — L570 Actinic keratosis: Secondary | ICD-10-CM | POA: Diagnosis not present

## 2019-09-04 DIAGNOSIS — L209 Atopic dermatitis, unspecified: Secondary | ICD-10-CM | POA: Diagnosis not present

## 2019-09-04 NOTE — Progress Notes (Signed)
Subjective:   Anita Krause is a 79 y.o. female who presents for Medicare Annual (Subsequent) preventive examination.  Review of Systems    Overdue for chronic disease management visit with PCP. Sees multiple specialist for most of her problems. Not taking her psych meds or seeing her psychiatrist anymore since none of the meds he gave her work. Recent diagnosis of lung cancer and MI. Had eye surgery this year.       Objective:    Today's Vitals   09/04/19 1521 09/04/19 1537  BP:  129/66  Pulse:  76  Temp:  98 F (36.7 C)  TempSrc:  Oral  SpO2:  95%  Weight: 161 lb 1.6 oz (73.1 kg) 161 lb 1.6 oz (73.1 kg)  Height: 5' 4.1" (1.628 m) 5\' 4"  (1.626 m)  PainSc:  6    Body mass index is 27.65 kg/m.  Advanced Directives 09/04/2019 05/10/2017  Does Patient Have a Medical Advance Directive? No No  Would patient like information on creating a medical advance directive? No - Patient declined No - Patient declined    Current Medications (verified) Outpatient Encounter Medications as of 09/04/2019  Medication Sig  . albuterol (PROVENTIL) (5 MG/ML) 0.5% nebulizer solution Take 0.5 mLs (2.5 mg total) by nebulization every 6 (six) hours as needed for wheezing or shortness of breath.  Marland Kitchen aspirin EC 81 MG tablet Take 81 mg by mouth daily. Swallow whole.  . Budeson-Glycopyrrol-Formoterol (BREZTRI AEROSPHERE) 160-9-4.8 MCG/ACT AERO Inhale 2 puffs into the lungs 2 (two) times daily as needed.  . clopidogrel (PLAVIX) 75 MG tablet Take 75 mg by mouth daily.  . cyclobenzaprine (FLEXERIL) 5 MG tablet Take 1 tablet (5 mg total) by mouth 3 (three) times daily as needed for muscle spasms.  . hydrOXYzine (ATARAX/VISTARIL) 25 MG tablet Take 25 mg by mouth 2 (two) times daily as needed for anxiety.  . ondansetron (ZOFRAN ODT) 4 MG disintegrating tablet Take one tab by mouth Q6hr prn nausea.  Dissolve under tongue.  . triamterene-hydrochlorothiazide (DYAZIDE) 37.5-25 MG capsule Take 1 each (1 capsule total)  by mouth 2 (two) times daily. For high blood pressure  . albuterol (PROVENTIL HFA;VENTOLIN HFA) 108 (90 Base) MCG/ACT inhaler Inhale 1-2 puffs into the lungs every 4 (four) hours as needed for wheezing or shortness of breath. (Patient not taking: Reported on 09/04/2019)  . ALPRAZolam (XANAX) 0.25 MG tablet Take 0.25 mg by mouth 3 (three) times daily as needed. (Patient not taking: Reported on 09/04/2019)  . carvedilol (COREG) 3.125 MG tablet Take 3.125 mg by mouth 2 (two) times daily. (Patient not taking: Reported on 09/04/2019)  . divalproex (DEPAKOTE) 250 MG DR tablet TK 3 TS PO HS (Patient not taking: Reported on 09/04/2019)  . FLUoxetine (PROZAC) 20 MG capsule  (Patient not taking: Reported on 09/04/2019)  . Fluticasone-Umeclidin-Vilant (TRELEGY ELLIPTA) 100-62.5-25 MCG/INH AEPB Inhale 1 Inhaler into the lungs daily. For chronic bronchitis / COPD (Patient not taking: Reported on 09/04/2019)  . naproxen (NAPROSYN) 500 MG tablet Take 1 tablet (500 mg total) by mouth 2 (two) times daily as needed for moderate pain. (Patient not taking: Reported on 09/04/2019)  . ticagrelor (BRILINTA) 90 MG TABS tablet Take by mouth. (Patient not taking: Reported on 09/04/2019)  . [DISCONTINUED] levofloxacin (LEVAQUIN) 500 MG tablet Take one tab by mouth once daily   No facility-administered encounter medications on file as of 09/04/2019.    Allergies (verified) Erythromycin, Nitrofurantoin macrocrystal, Penicillins, Colesevelam hcl, Doxycycline, and Lamotrigine   History: Past Medical History:  Diagnosis Date  . AAA (abdominal aortic aneurysm) without rupture ([place]) 05/03/2017   3.4 cm May 2019  . Bipolar 1 disorder ([place])   . 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  . Chronic back pain 03/14/2017  . Chronic bronchitis ([place]) 09/21/2016  . COPD (chronic obstructive pulmonary disease) ([place])   . Hypertension    Past Surgical History:  Procedure Laterality Date  . ABDOMINAL HYSTERECTOMY    . ANEURYSM COILING    . BACK SURGERY    . CARDIAC  SURGERY    . POLYPECTOMY     VOCAL CORD   Family History  Problem Relation Age of Onset  . Hyperlipidemia Mother   . Hypertension Mother   . Diabetes Mother   . Hyperlipidemia Father   . Hypertension Father   . Cancer Sister    Social History   Socioeconomic History  . Marital status: Divorced    Spouse name: Not on file  . Number of children: Not on file  . Years of education: Not on file  . Highest education level: Not on file  Occupational History  . Not on file  Tobacco Use  . Smoking status: Current Every Day Smoker    Packs/day: 0.25    Types: Cigarettes  . Smokeless tobacco: Never Used  . Tobacco comment: 09/10/17  Vaping Use  . Vaping Use: Never used  Substance and Sexual Activity  . Alcohol use: No  . Drug use: No  . Sexual activity: Not Currently  Other Topics Concern  . Not on file  Social History Narrative  . Not on file   Social Determinants of Health   Financial Resource Strain:   . Difficulty of Paying Living Expenses: Not on file  Food Insecurity:   . Worried About Charity fundraiser in the Last Year: Not on file  . Ran Out of Food in the Last Year: Not on file  Transportation Needs:   . Lack of Transportation (Medical): Not on file  . Lack of Transportation (Non-Medical): Not on file  Physical Activity:   . Days of Exercise per Week: Not on file  . Minutes of Exercise per Session: Not on file  Stress:   . Feeling of Stress : Not on file  Social Connections:   . Frequency of Communication with Friends and Family: Not on file  . Frequency of Social Gatherings with Friends and Family: Not on file  . Attends Religious Services: Not on file  . Active Member of Clubs or Organizations: Not on file  . Attends Archivist Meetings: Not on file  . Marital Status: Not on file    Tobacco Counseling Ready to quit: Not Answered Counseling given: Not Answered Comment: 09/10/17   Clinical Intake:  Pre-visit preparation completed:  Yes  Pain : 0-10 Pain Score: 6  Pain Type: Chronic pain Pain Location: Back Pain Onset: More than a month ago     Diabetes: No  How often do you need to have someone help you when you read instructions, pamphlets, or other written materials from your doctor or pharmacy?: 1 - Never What is the last grade level you completed in school?: 12th grade  Diabetic? No  Interpreter Needed?: No  Information entered by :: [place],CMA   Activities of Daily Living In your present state of health, do you have any difficulty performing the following activities: 09/04/2019  Hearing? N  Vision? N  Difficulty concentrating or making decisions? N  Walking or climbing stairs? Y  Dressing or bathing?  Y  Doing errands, shopping? N  Preparing Food and eating ? Y  Using the Toilet? N  In the past six months, have you accidently leaked urine? N  Do you have problems with loss of bowel control? N  Managing your Medications? N  Managing your Finances? N  Housekeeping or managing your Housekeeping? Y  Some recent data might be hidden    Patient Care Team: Emeterio Reeve, DO as PCP - General (Osteopathic Medicine)  Indicate any recent Medical Services you may have received from other than [facility] providers in the past year (date may be approximate).     Assessment:   This is a routine wellness examination for [place].  Hearing/Vision screen No exam data present  Dietary issues and exercise activities discussed: Current Exercise Habits: The patient does not participate in regular exercise at present, Exercise limited by: orthopedic condition(s)  Goals   None    Depression Screen PHQ 2/9 Scores 09/04/2019 09/04/2019 08/26/2018 09/10/2017 09/21/2016  PHQ - 2 Score 0 0 6 0 3  PHQ- 9 Score - - 14 12 20     Fall Risk Fall Risk  09/04/2019 07/31/2018  Falls in the past year? 0 0  Comment - Emmi Telephone Survey: data to providers prior to load  Number falls in past yr: 0 -  Injury with Fall? 0 -  Follow  up Falls evaluation completed -    Any stairs in or around the home? No  If so, are there any without handrails? No  Home free of loose throw rugs in walkways, pet beds, electrical cords, etc? No  Adequate lighting in your home to reduce risk of falls? Yes   ASSISTIVE DEVICES UTILIZED TO PREVENT FALLS:  Life alert? No  Use of a cane, walker or w/c? No  Grab bars in the bathroom? Yes  Shower chair or bench in shower? No  Elevated toilet seat or a handicapped toilet? No   Gait steady and fast without use of assistive device  Cognitive Function:     6CIT Screen 09/04/2019  What Year? 0 points  What month? 0 points  What time? 0 points  Count back from 20 2 points  Months in reverse 2 points  Repeat phrase 2 points  Total Score 6    Immunizations Immunization History  Administered Date(s) Administered  . Influenza, High Dose Seasonal PF 09/24/2018  . Influenza,inj,Quad PF,6+ Mos 09/24/2018  . Influenza-Unspecified 09/24/2018  . PFIZER SARS-COV-2 Vaccination 02/25/2019, 03/23/2019  . Pneumococcal Polysaccharide-23 05/16/2017  . Pneumococcal-Unspecified 05/16/2017  . Tdap 10/30/2018, 10/30/2018  . Zoster Recombinat (Shingrix) 10/30/2018, 10/30/2018    TDAP status: Up to date Flu Vaccine status: Declined, Education has been provided regarding the importance of this vaccine but patient still declined. Advised may receive this vaccine at local pharmacy or Health Dept. Aware to provide a copy of the vaccination record if obtained from local pharmacy or Health Dept. Verbalized acceptance and understanding. Pneumococcal vaccine status: Up to date Covid-19 vaccine status: Completed vaccines  Qualifies for Shingles Vaccine? No   Zostavax completed No   Shingrix Completed?: Yes  Screening Tests Health Maintenance  Topic Date Due  . Hepatitis C Screening  Never done  . INFLUENZA VACCINE  04/01/2020 (Originally 08/03/2019)  . DEXA SCAN  09/03/2020 (Originally 04/07/2005)  . PNA  vac Low Risk Adult (2 of 2 - PCV13) 09/03/2020 (Originally 05/17/2018)  . TETANUS/TDAP  10/29/2028  . COVID-19 Vaccine  Completed    Health Maintenance  Health Maintenance Due  Topic Date Due  . Hepatitis C Screening  Never done    Colorectal cancer screening: Referral to GI placed today. Pt aware the office will call re: appt. Mammogram status: No longer required.  Declined DEXA scan  Lung Cancer Screening: (Low Dose CT Chest recommended if Age 70-80 years, 30 pack-year currently smoking OR have quit w/in 15years.) does not qualify.   Lung Cancer Screening Referral: NA  Additional Screening:  Hepatitis C Screening: does not qualify  Vision Screening: Recommended annual ophthalmology exams for early detection of glaucoma and other disorders of the eye. Is the patient up to date with their annual eye exam?  Yes  Who is the provider or what is the name of the office in which the patient attends annual eye exams? [place] office, unable to recall name If pt is not established with a provider, would they like to be referred to a provider to establish care? No .   Dental Screening: Recommended annual dental exams for proper oral hygiene  Community Resource Referral / Chronic Care Management: CRR required this visit?  No   CCM required this visit?  No      Plan:     Anita Krause , Thank you for taking time to come for your Medicare Wellness Visit. I appreciate your ongoing commitment to your health goals. Please review the following plan we discussed and let me know if I can assist you in the future.   These are the goals we discussed: Goals    . Follow up with Primary Care Provider    . Weight (lb) < 160 lb (72.6 kg)       This is a list of the screening recommended for you and due dates:  Health Maintenance  Topic Date Due  .  Hepatitis C: One time screening is recommended by Center for Disease Control  (CDC) for  adults born from [birth year] through 1965.   Never done  .  Flu Shot  04/01/2020*  . DEXA scan (bone density measurement)  09/03/2020*  . Pneumonia vaccines (2 of 2 - PCV13) 09/03/2020*  . Tetanus Vaccine  10/29/2028  . COVID-19 Vaccine  Completed  *Topic was postponed. The date shown is not the original due date.    I have personally reviewed and noted the following in the patient's chart:   . Medical and social history . Use of alcohol, tobacco or illicit drugs  . Current medications and supplements . Functional ability and status . Nutritional status . Physical activity . Advanced directives . List of other physicians . Hospitalizations, surgeries, and ER visits in previous 12 months . Vitals . Screenings to include cognitive, depression, and falls . Referrals and appointments  In addition, I have reviewed and discussed with patient certain preventive protocols, quality metrics, and best practice recommendations. A written personalized care plan for preventive services as well as general preventive health recommendations were provided to patient.   Clearnce Sorrel, DNP, APRN, FNP-BC [facility] 09/04/2019

## 2019-09-05 DIAGNOSIS — J3089 Other allergic rhinitis: Secondary | ICD-10-CM | POA: Diagnosis not present

## 2019-09-05 DIAGNOSIS — J449 Chronic obstructive pulmonary disease, unspecified: Secondary | ICD-10-CM | POA: Diagnosis not present

## 2019-09-05 DIAGNOSIS — F17210 Nicotine dependence, cigarettes, uncomplicated: Secondary | ICD-10-CM | POA: Diagnosis not present

## 2019-09-05 DIAGNOSIS — R05 Cough: Secondary | ICD-10-CM | POA: Diagnosis not present

## 2019-09-05 DIAGNOSIS — Z23 Encounter for immunization: Secondary | ICD-10-CM | POA: Diagnosis not present

## 2019-09-05 DIAGNOSIS — C3411 Malignant neoplasm of upper lobe, right bronchus or lung: Secondary | ICD-10-CM | POA: Diagnosis not present

## 2019-09-11 DIAGNOSIS — M961 Postlaminectomy syndrome, not elsewhere classified: Secondary | ICD-10-CM | POA: Diagnosis not present

## 2019-09-12 DIAGNOSIS — C3411 Malignant neoplasm of upper lobe, right bronchus or lung: Secondary | ICD-10-CM | POA: Diagnosis not present

## 2019-09-12 DIAGNOSIS — R918 Other nonspecific abnormal finding of lung field: Secondary | ICD-10-CM | POA: Diagnosis not present

## 2019-09-12 DIAGNOSIS — C3490 Malignant neoplasm of unspecified part of unspecified bronchus or lung: Secondary | ICD-10-CM | POA: Diagnosis not present

## 2019-09-12 DIAGNOSIS — J929 Pleural plaque without asbestos: Secondary | ICD-10-CM | POA: Diagnosis not present

## 2019-09-12 DIAGNOSIS — I700 Atherosclerosis of aorta: Secondary | ICD-10-CM | POA: Diagnosis not present

## 2019-09-17 DIAGNOSIS — F411 Generalized anxiety disorder: Secondary | ICD-10-CM | POA: Diagnosis not present

## 2019-09-17 DIAGNOSIS — F5101 Primary insomnia: Secondary | ICD-10-CM | POA: Diagnosis not present

## 2019-09-17 DIAGNOSIS — F319 Bipolar disorder, unspecified: Secondary | ICD-10-CM | POA: Diagnosis not present

## 2019-09-18 ENCOUNTER — Ambulatory Visit (INDEPENDENT_AMBULATORY_CARE_PROVIDER_SITE_OTHER): Payer: Medicare Other | Admitting: Nurse Practitioner

## 2019-09-18 ENCOUNTER — Encounter: Payer: Self-pay | Admitting: Nurse Practitioner

## 2019-09-18 ENCOUNTER — Other Ambulatory Visit: Payer: Self-pay

## 2019-09-18 VITALS — BP 122/65 | HR 72 | Wt 159.0 lb

## 2019-09-18 DIAGNOSIS — R05 Cough: Secondary | ICD-10-CM | POA: Diagnosis not present

## 2019-09-18 DIAGNOSIS — J42 Unspecified chronic bronchitis: Secondary | ICD-10-CM

## 2019-09-18 DIAGNOSIS — I10 Essential (primary) hypertension: Secondary | ICD-10-CM | POA: Diagnosis not present

## 2019-09-18 DIAGNOSIS — L299 Pruritus, unspecified: Secondary | ICD-10-CM

## 2019-09-18 DIAGNOSIS — Z131 Encounter for screening for diabetes mellitus: Secondary | ICD-10-CM

## 2019-09-18 DIAGNOSIS — Z13220 Encounter for screening for lipoid disorders: Secondary | ICD-10-CM | POA: Diagnosis not present

## 2019-09-18 DIAGNOSIS — Z130 Encounter for screening for diseases of the blood and blood-forming organs and certain disorders involving the immune mechanism: Secondary | ICD-10-CM

## 2019-09-18 DIAGNOSIS — K219 Gastro-esophageal reflux disease without esophagitis: Secondary | ICD-10-CM | POA: Diagnosis not present

## 2019-09-18 DIAGNOSIS — Z8639 Personal history of other endocrine, nutritional and metabolic disease: Secondary | ICD-10-CM | POA: Diagnosis not present

## 2019-09-18 DIAGNOSIS — Z1329 Encounter for screening for other suspected endocrine disorder: Secondary | ICD-10-CM

## 2019-09-18 DIAGNOSIS — M62838 Other muscle spasm: Secondary | ICD-10-CM | POA: Diagnosis not present

## 2019-09-18 DIAGNOSIS — R059 Cough, unspecified: Secondary | ICD-10-CM

## 2019-09-18 MED ORDER — BUDESONIDE-FORMOTEROL FUMARATE 80-4.5 MCG/ACT IN AERO: 2.0000 | INHALATION_SPRAY | Freq: Two times a day (BID) | RESPIRATORY_TRACT | 12 refills | Status: DC

## 2019-09-18 MED ORDER — CYCLOBENZAPRINE HCL 5 MG PO TABS: 5.0000 mg | ORAL_TABLET | Freq: Three times a day (TID) | ORAL | 1 refills | Status: DC | PRN

## 2019-09-18 MED ORDER — OMEPRAZOLE 40 MG PO CPDR: 40.0000 mg | DELAYED_RELEASE_CAPSULE | Freq: Every day | ORAL | 11 refills | Status: DC

## 2019-09-18 MED ORDER — BENZONATATE 200 MG PO CAPS: 200.0000 mg | ORAL_CAPSULE | Freq: Three times a day (TID) | ORAL | 6 refills | Status: DC | PRN

## 2019-09-18 MED ORDER — TRIAMTERENE-HCTZ 37.5-25 MG PO CAPS: 1.0000 | ORAL_CAPSULE | Freq: Two times a day (BID) | ORAL | 1 refills | Status: DC

## 2019-09-18 MED ORDER — HYDROXYZINE HCL 25 MG PO TABS: 25.0000 mg | ORAL_TABLET | Freq: Two times a day (BID) | ORAL | 5 refills | Status: DC | PRN

## 2019-09-18 NOTE — Assessment & Plan Note (Signed)
Intermittent itching that is longstanding. Patient is requesting a refill on her hydroxyzine which she utilizes for intermittent periods of itching. Refills provided.

## 2019-09-18 NOTE — Assessment & Plan Note (Signed)
Cough in the setting of chronic lung condition. Patient is requesting refills on Tessalon Perles which are helpful for her cough at night. Refills provided.

## 2019-09-18 NOTE — Assessment & Plan Note (Signed)
Refills today provided for Prilosec 40 mg once a day.

## 2019-09-18 NOTE — Assessment & Plan Note (Signed)
History of chronic bronchitis for which the patient does utilize albuterol nebulizers andBreztri inhaler.   She reports that she is out of the [facility] inhaler which she usually gets from her pulmonologist as a sample due to cost.  She reports that she is unable to afford the usual cost of the medication.   Discussed the option with attempting a less expensive option such as Symbicort..  Patient is agreeable to this and reports she has used Symbicort successfully in the past. Prescription for Symbicort sent to pharmacy.  Recommend the patient follow-up with her pulmonologist if this medication is not effective.

## 2019-09-18 NOTE — Assessment & Plan Note (Signed)
Patient reported history of B12 deficiency for which she has received B12 injections in the past. She did request injections today and it was discussed with her that we are unable to provide injections unless the laboratory findings indicate that there is a need. Lab orders placed today.

## 2019-09-18 NOTE — Assessment & Plan Note (Signed)
Lab orders for CBC placed today in preparation for upcoming annual physical exam.

## 2019-09-18 NOTE — Assessment & Plan Note (Signed)
History of atherosclerosis and other CVD.  Patient is not currently on a statin although given her age she may opt out of statin medication. We will obtain labs today in preparation for annual physical exam coming up.

## 2019-09-18 NOTE — Assessment & Plan Note (Signed)
Lab orders placed today for CMP in preparation for upcoming annual physical exam.

## 2019-09-18 NOTE — Assessment & Plan Note (Signed)
Longstanding history of muscle spasms to the back.  Refills provided today for Flexeril.

## 2019-09-18 NOTE — Assessment & Plan Note (Signed)
Patient reported history of hypothyroidism although she is not on any medications at this time.  Obtaining labs today for upcoming annual physical exam we will also obtain thyroid evaluation.

## 2019-09-18 NOTE — Assessment & Plan Note (Signed)
Patient is at home blood pressure readings 150-160/70-75.  In the office today her blood pressure is 122/65.   At this time it is unclear if her blood pressure cuff at home could be incorrect or if her blood pressure was just good today in the office. It was determined upon our evaluation with her medications that she is only taking the triamterene-hydrochlorothiazide once a day and this prescription has been written for twice a day.  She reports that she was unaware that she should be taking this twice a day. Discussed with the patient to continue to monitor blood pressure and bring her blood pressure cuff into the next visit so that we can evaluate the accuracy. No changes to medications today patient educated on correct dosing. Refills provided and lab orders placed.

## 2019-09-18 NOTE — Progress Notes (Signed)
Acute Office Visit  Subjective:    Patient ID: Anita Krause, female    DOB: 1940/11/08, 79 y.o.   MRN: [id number]  CC: High blood pressure at home.  HPI Patient is in today for evaluation of her blood pressure.  She reports that her blood pressure has been running high at home.  She reports that she has been receiving systolic readings of 423-536 and diastolic readings of 14-43.  She states that she has been taking her medications as prescribed.  She does report that she has stopped her Plavix as her cardiologist told her that she could stop this medication after 6 months.  She denies chest pains, shortness of breath, dizziness, headaches, vision changes.  She reports she is taking triamterene-hydrochlorothiazide 37.5 - 25 mg/day and she denies missing any doses.  She does report that she has not been seen in this office for quite some time and would like to have some refills on her medications.  She does report that she plans to follow-up with an annual physical exams soon and would like to also have orders for her labs placed.  She states that in the past she has had low B12 levels and has required B12 injections and she wanted to know if she could get more of these today.  She is also requesting to have her ears cleaned out today.  Past Medical History:  Diagnosis Date  . AAA (abdominal aortic aneurysm) without rupture ([place]) 05/03/2017   3.4 cm May 2019  . Bipolar 1 disorder ([place])   . 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  . Chronic back pain 03/14/2017  . Chronic bronchitis ([place]) 09/21/2016  . COPD (chronic obstructive pulmonary disease) ([place])   . Hypertension     Past Surgical History:  Procedure Laterality Date  . ABDOMINAL HYSTERECTOMY    . ANEURYSM COILING    . BACK SURGERY    . CARDIAC SURGERY    . POLYPECTOMY     VOCAL CORD    Family History  Problem Relation Age of Onset  . Hyperlipidemia Mother   . Hypertension Mother   . Diabetes Mother   . Hyperlipidemia Father   .  Hypertension Father   . Cancer Sister     Social History   Socioeconomic History  . Marital status: Divorced    Spouse name: Not on file  . Number of children: Not on file  . Years of education: Not on file  . Highest education level: Not on file  Occupational History  . Not on file  Tobacco Use  . Smoking status: Current Every Day Smoker    Packs/day: 0.25    Types: Cigarettes  . Smokeless tobacco: Never Used  . Tobacco comment: 09/10/17  Vaping Use  . Vaping Use: Never used  Substance and Sexual Activity  . Alcohol use: No  . Drug use: No  . Sexual activity: Not Currently  Other Topics Concern  . Not on file  Social History Narrative  . Not on file   Social Determinants of Health   Financial Resource Strain:   . Difficulty of Paying Living Expenses: Not on file  Food Insecurity:   . Worried About Charity fundraiser in the Last Year: Not on file  . Ran Out of Food in the Last Year: Not on file  Transportation Needs:   . Lack of Transportation (Medical): Not on file  . Lack of Transportation (Non-Medical): Not on file  Physical Activity:   . Days of Exercise  per Week: Not on file  . Minutes of Exercise per Session: Not on file  Stress:   . Feeling of Stress : Not on file  Social Connections:   . Frequency of Communication with Friends and Family: Not on file  . Frequency of Social Gatherings with Friends and Family: Not on file  . Attends Religious Services: Not on file  . Active Member of Clubs or Organizations: Not on file  . Attends Archivist Meetings: Not on file  . Marital Status: Not on file  Intimate Partner Violence:   . Fear of Current or Ex-Partner: Not on file  . Emotionally Abused: Not on file  . Physically Abused: Not on file  . Sexually Abused: Not on file    Outpatient Medications Prior to Visit  Medication Sig Dispense Refill  . albuterol (PROVENTIL) (5 MG/ML) 0.5% nebulizer solution Take 0.5 mLs (2.5 mg total) by nebulization  every 6 (six) hours as needed for wheezing or shortness of breath. 20 mL 1  . aspirin EC 81 MG tablet Take 81 mg by mouth daily. Swallow whole.    . Budeson-Glycopyrrol-Formoterol (BREZTRI AEROSPHERE) 160-9-4.8 MCG/ACT AERO Inhale 2 puffs into the lungs 2 (two) times daily as needed.    . ondansetron (ZOFRAN ODT) 4 MG disintegrating tablet Take one tab by mouth Q6hr prn nausea.  Dissolve under tongue. 12 tablet 0  . clopidogrel (PLAVIX) 75 MG tablet Take 75 mg by mouth daily.    . cyclobenzaprine (FLEXERIL) 5 MG tablet Take 1 tablet (5 mg total) by mouth 3 (three) times daily as needed for muscle spasms. 90 tablet 1  . hydrOXYzine (ATARAX/VISTARIL) 25 MG tablet Take 25 mg by mouth 2 (two) times daily as needed for anxiety.    . triamterene-hydrochlorothiazide (DYAZIDE) 37.5-25 MG capsule Take 1 each (1 capsule total) by mouth 2 (two) times daily. For high blood pressure 180 capsule 1   No facility-administered medications prior to visit.    Allergies  Allergen Reactions  . Erythromycin Anaphylaxis    Throat closes up, ling in mouth peals  . Nitrofurantoin Macrocrystal Anaphylaxis    Pt states her throat closes up  . Penicillins Anaphylaxis  . Colesevelam Hcl Other (See Comments)  . Doxycycline Rash  . Lamotrigine Rash    Review of Systems All review of systems negative except for what is pertinently listed in the HPI    Objective:    Physical Exam Vitals and nursing note reviewed.  Constitutional:      Appearance: Normal appearance. She is normal weight.  HENT:     Head: Normocephalic.  Eyes:     Extraocular Movements: Extraocular movements intact.     Conjunctiva/sclera: Conjunctivae normal.     Pupils: Pupils are equal, round, and reactive to light.  Neck:     Vascular: No carotid bruit.  Cardiovascular:     Rate and Rhythm: Normal rate and regular rhythm.     Pulses: Normal pulses.     Heart sounds: Normal heart sounds.  Pulmonary:     Effort: Pulmonary effort is  normal.     Breath sounds: Normal breath sounds.  Abdominal:     General: Abdomen is flat. Bowel sounds are normal.     Palpations: Abdomen is soft.  Musculoskeletal:        General: Normal range of motion.     Cervical back: Normal range of motion.     Right lower leg: No edema.     Left lower leg: No  edema.  Skin:    General: Skin is warm and dry.     Capillary Refill: Capillary refill takes less than 2 seconds.  Neurological:     General: No focal deficit present.     Mental Status: She is alert and oriented to person, place, and time.  Psychiatric:        Mood and Affect: Mood normal.        Behavior: Behavior normal.        Thought Content: Thought content normal.        Judgment: Judgment normal.     BP 122/65   Pulse 72   Wt 159 lb (72.1 kg)   SpO2 96%   BMI 27.29 kg/m  Wt Readings from Last 3 Encounters:  09/18/19 159 lb (72.1 kg)  09/04/19 161 lb 1.6 oz (73.1 kg)  03/30/19 152 lb 1.9 oz (69 kg)    Health Maintenance Due  Topic Date Due  . Hepatitis C Screening  Never done    There are no preventive care reminders to display for this patient.   Lab Results  Component Value Date   TSH 1.21 10/23/2018   Lab Results  Component Value Date   WBC 5.4 10/23/2018   HGB 14.8 10/23/2018   HCT 44.4 10/23/2018   MCV 87.7 10/23/2018   PLT 206 10/23/2018   Lab Results  Component Value Date   NA 138 10/23/2018   K 4.1 10/23/2018   CO2 34 (H) 10/23/2018   GLUCOSE 104 (H) 10/23/2018   BUN 9 10/23/2018   CREATININE 0.90 10/23/2018   BILITOT 0.3 10/23/2018   ALKPHOS 53 08/28/2016   AST 15 10/23/2018   ALT 7 10/23/2018   PROT 6.1 10/23/2018   ALBUMIN 4.3 08/28/2016   CALCIUM 9.7 10/23/2018   ANIONGAP 12 06/28/2018   Lab Results  Component Value Date   CHOL 253 (H) 08/29/2018   Lab Results  Component Value Date   HDL 59 08/29/2018   Lab Results  Component Value Date   LDLCALC 162 (H) 08/29/2018   Lab Results  Component Value Date   TRIG 167  (H) 08/29/2018   Lab Results  Component Value Date   CHOLHDL 4.3 08/29/2018   Lab Results  Component Value Date   HGBA1C 5.3 10/23/2018       Assessment & Plan:  Discussed with the patient that we would be unable to clean out her ears at the visit today due to this being an acute visit and addressing multiple other issues.  Recommended that she make an appointment as she is leaving for specific evaluation of her ears and for the option having these cleaned out.  Also recommend that the patient make a separate appointment for her annual physical exam.  Discussed with the patient that the annual physical exam will not be able to address new concerns or complaints.   Problem List Items Addressed This Visit      Cardiovascular and Mediastinum   Hypertension - Primary    Patient is at home blood pressure readings 150-160/70-75.  In the office today her blood pressure is 122/65.   At this time it is unclear if her blood pressure cuff at home could be incorrect or if her blood pressure was just good today in the office. It was determined upon our evaluation with her medications that she is only taking the triamterene-hydrochlorothiazide once a day and this prescription has been written for twice a day.  She reports that she was  unaware that she should be taking this twice a day. Discussed with the patient to continue to monitor blood pressure and bring her blood pressure cuff into the next visit so that we can evaluate the accuracy. No changes to medications today patient educated on correct dosing. Refills provided and lab orders placed.      Relevant Medications   triamterene-hydrochlorothiazide (DYAZIDE) 37.5-25 MG capsule   Other Relevant Orders   CBC with Differential   COMPLETE METABOLIC PANEL WITH GFR   Lipid panel   B12 and Folate Panel   Thyroid Panel With TSH     Respiratory   Chronic bronchitis (HCC)    History of chronic bronchitis for which the patient does utilize  albuterol nebulizers andBreztri inhaler.   She reports that she is out of the [facility] inhaler which she usually gets from her pulmonologist as a sample due to cost.  She reports that she is unable to afford the usual cost of the medication.   Discussed the option with attempting a less expensive option such as Symbicort..  Patient is agreeable to this and reports she has used Symbicort successfully in the past. Prescription for Symbicort sent to pharmacy.  Recommend the patient follow-up with her pulmonologist if this medication is not effective.       Relevant Medications   budesonide-formoterol (SYMBICORT) 80-4.5 MCG/ACT inhaler     Digestive   Gastroesophageal reflux disease    Refills today provided for Prilosec 40 mg once a day.      Relevant Medications   omeprazole (PRILOSEC) 40 MG capsule   Other Relevant Orders   CBC with Differential   COMPLETE METABOLIC PANEL WITH GFR   Lipid panel   B12 and Folate Panel   Thyroid Panel With TSH     Other   Muscle spasm    Longstanding history of muscle spasms to the back.  Refills provided today for Flexeril.      Relevant Medications   cyclobenzaprine (FLEXERIL) 5 MG tablet   Other Relevant Orders   CBC with Differential   COMPLETE METABOLIC PANEL WITH GFR   Lipid panel   B12 and Folate Panel   Thyroid Panel With TSH   Cough    Cough in the setting of chronic lung condition. Patient is requesting refills on Tessalon Perles which are helpful for her cough at night. Refills provided.      Relevant Medications   benzonatate (TESSALON) 200 MG capsule   budesonide-formoterol (SYMBICORT) 80-4.5 MCG/ACT inhaler   Itching    Intermittent itching that is longstanding. Patient is requesting a refill on her hydroxyzine which she utilizes for intermittent periods of itching. Refills provided.      Relevant Medications   hydrOXYzine (ATARAX/VISTARIL) 25 MG tablet   Screening for hypothyroidism    Patient reported history of  hypothyroidism although she is not on any medications at this time.  Obtaining labs today for upcoming annual physical exam we will also obtain thyroid evaluation.      Relevant Orders   CBC with Differential   COMPLETE METABOLIC PANEL WITH GFR   Lipid panel   B12 and Folate Panel   Thyroid Panel With TSH   Screening for cholesterol level    History of atherosclerosis and other CVD.  Patient is not currently on a statin although given her age she may opt out of statin medication. We will obtain labs today in preparation for annual physical exam coming up.      Relevant Orders  CBC with Differential   COMPLETE METABOLIC PANEL WITH GFR   Lipid panel   B12 and Folate Panel   Thyroid Panel With TSH   History of non anemic vitamin B12 deficiency    Patient reported history of B12 deficiency for which she has received B12 injections in the past. She did request injections today and it was discussed with her that we are unable to provide injections unless the laboratory findings indicate that there is a need. Lab orders placed today.      Relevant Orders   CBC with Differential   COMPLETE METABOLIC PANEL WITH GFR   Lipid panel   B12 and Folate Panel   Thyroid Panel With TSH   Screening for iron deficiency anemia    Lab orders for CBC placed today in preparation for upcoming annual physical exam.      Relevant Orders   CBC with Differential   COMPLETE METABOLIC PANEL WITH GFR   Lipid panel   B12 and Folate Panel   Thyroid Panel With TSH   Screening for diabetes mellitus    Lab orders placed today for CMP in preparation for upcoming annual physical exam.      Relevant Orders   CBC with Differential   COMPLETE METABOLIC PANEL WITH GFR   Lipid panel   B12 and Folate Panel   Thyroid Panel With TSH       Meds ordered this encounter  Medications  . cyclobenzaprine (FLEXERIL) 5 MG tablet    Sig: Take 1 tablet (5 mg total) by mouth 3 (three) times daily as needed for  muscle spasms.    Dispense:  90 tablet    Refill:  1  . hydrOXYzine (ATARAX/VISTARIL) 25 MG tablet    Sig: Take 1 tablet (25 mg total) by mouth 2 (two) times daily as needed for anxiety.    Dispense:  30 tablet    Refill:  5  . triamterene-hydrochlorothiazide (DYAZIDE) 37.5-25 MG capsule    Sig: Take 1 each (1 capsule total) by mouth 2 (two) times daily. For high blood pressure    Dispense:  180 capsule    Refill:  1  . benzonatate (TESSALON) 200 MG capsule    Sig: Take 1 capsule (200 mg total) by mouth 3 (three) times daily as needed for cough.    Dispense:  90 capsule    Refill:  6  . budesonide-formoterol (SYMBICORT) 80-4.5 MCG/ACT inhaler    Sig: Inhale 2 puffs into the lungs in the morning and at bedtime.    Dispense:  10.2 g    Refill:  12  . omeprazole (PRILOSEC) 40 MG capsule    Sig: Take 1 capsule (40 mg total) by mouth daily.    Dispense:  30 capsule    Refill:  11   A total of 41 minutes spent face-to-face with the patient today with greater than 50% of this time spent with education and evaluation of medications and refills required.  Follow-up with separate visit for annual physical exam. Follow-up to have ears cleaned out with a separate visit.  Orma Render, NP

## 2019-09-23 DIAGNOSIS — M961 Postlaminectomy syndrome, not elsewhere classified: Secondary | ICD-10-CM | POA: Diagnosis not present

## 2019-09-23 DIAGNOSIS — M47816 Spondylosis without myelopathy or radiculopathy, lumbar region: Secondary | ICD-10-CM | POA: Diagnosis not present

## 2019-09-23 DIAGNOSIS — M48061 Spinal stenosis, lumbar region without neurogenic claudication: Secondary | ICD-10-CM | POA: Diagnosis not present

## 2019-09-23 DIAGNOSIS — M4807 Spinal stenosis, lumbosacral region: Secondary | ICD-10-CM | POA: Diagnosis not present

## 2019-09-23 DIAGNOSIS — M545 Low back pain: Secondary | ICD-10-CM | POA: Diagnosis not present

## 2019-09-25 DIAGNOSIS — J3089 Other allergic rhinitis: Secondary | ICD-10-CM | POA: Diagnosis not present

## 2019-09-25 DIAGNOSIS — J449 Chronic obstructive pulmonary disease, unspecified: Secondary | ICD-10-CM | POA: Diagnosis not present

## 2019-09-25 DIAGNOSIS — R05 Cough: Secondary | ICD-10-CM | POA: Diagnosis not present

## 2019-09-25 DIAGNOSIS — F17210 Nicotine dependence, cigarettes, uncomplicated: Secondary | ICD-10-CM | POA: Diagnosis not present

## 2019-09-29 DIAGNOSIS — Z8639 Personal history of other endocrine, nutritional and metabolic disease: Secondary | ICD-10-CM | POA: Diagnosis not present

## 2019-09-29 DIAGNOSIS — Z13220 Encounter for screening for lipoid disorders: Secondary | ICD-10-CM | POA: Diagnosis not present

## 2019-09-29 DIAGNOSIS — Z131 Encounter for screening for diabetes mellitus: Secondary | ICD-10-CM | POA: Diagnosis not present

## 2019-09-29 DIAGNOSIS — Z1329 Encounter for screening for other suspected endocrine disorder: Secondary | ICD-10-CM | POA: Diagnosis not present

## 2019-09-29 DIAGNOSIS — Z8349 Family history of other endocrine, nutritional and metabolic diseases: Secondary | ICD-10-CM | POA: Diagnosis not present

## 2019-09-29 DIAGNOSIS — K219 Gastro-esophageal reflux disease without esophagitis: Secondary | ICD-10-CM | POA: Diagnosis not present

## 2019-09-29 DIAGNOSIS — M62838 Other muscle spasm: Secondary | ICD-10-CM | POA: Diagnosis not present

## 2019-09-29 DIAGNOSIS — Z130 Encounter for screening for diseases of the blood and blood-forming organs and certain disorders involving the immune mechanism: Secondary | ICD-10-CM | POA: Diagnosis not present

## 2019-09-29 DIAGNOSIS — I10 Essential (primary) hypertension: Secondary | ICD-10-CM | POA: Diagnosis not present

## 2019-09-29 LAB — COMPLETE METABOLIC PANEL WITH GFR
AG Ratio: 2 (calc) (ref 1.0–2.5)
ALT: 16 U/L (ref 6–29)
AST: 17 U/L (ref 10–35)
Albumin: 4.1 g/dL (ref 3.6–5.1)
Alkaline phosphatase (APISO): 56 U/L (ref 37–153)
BUN/Creatinine Ratio: 15 (calc) (ref 6–22)
BUN: 14 mg/dL (ref 7–25)
CO2: 32 mmol/L (ref 20–32)
Calcium: 9.5 mg/dL (ref 8.6–10.4)
Chloride: 98 mmol/L (ref 98–110)
Creat: 0.94 mg/dL — ABNORMAL HIGH (ref 0.60–0.93)
GFR, Est African American: 67 mL/min/{1.73_m2} (ref >=60)
GFR, Est Non African American: 58 mL/min/{1.73_m2} — ABNORMAL LOW (ref >=60)
Globulin: 2.1 g/dL (calc) (ref 1.9–3.7)
Glucose, Bld: 94 mg/dL (ref 65–99)
Potassium: 4 mmol/L (ref 3.5–5.3)
Sodium: 140 mmol/L (ref 135–146)
Total Bilirubin: 0.5 mg/dL (ref 0.2–1.2)
Total Protein: 6.2 g/dL (ref 6.1–8.1)

## 2019-09-29 LAB — THYROID PANEL WITH TSH
Free Thyroxine Index: 2.8 (ref 1.4–3.8)
T3 Uptake: 30 % (ref 22–35)
T4, Total: 9.2 ug/dL (ref 5.1–11.9)
TSH: 2.48 mIU/L (ref 0.40–4.50)

## 2019-09-29 LAB — CBC WITH DIFFERENTIAL/PLATELET
Absolute Monocytes: 467 cells/uL (ref 200–950)
Basophils Absolute: 51 cells/uL (ref 0–200)
Basophils Relative: 0.9 %
Eosinophils Absolute: 239 cells/uL (ref 15–500)
Eosinophils Relative: 4.2 %
HCT: 41.3 % (ref 35.0–45.0)
Hemoglobin: 13.9 g/dL (ref 11.7–15.5)
Lymphs Abs: 1345 cells/uL (ref 850–3900)
MCH: 29.3 pg (ref 27.0–33.0)
MCHC: 33.7 g/dL (ref 32.0–36.0)
MCV: 87.1 fL (ref 80.0–100.0)
MPV: 10.3 fL (ref 7.5–12.5)
Monocytes Relative: 8.2 %
Neutro Abs: 3597 cells/uL (ref 1500–7800)
Neutrophils Relative %: 63.1 %
Platelets: 156 10*3/uL (ref 140–400)
RBC: 4.74 10*6/uL (ref 3.80–5.10)
RDW: 16.6 % — ABNORMAL HIGH (ref 11.0–15.0)
Total Lymphocyte: 23.6 %
WBC: 5.7 10*3/uL (ref 3.8–10.8)

## 2019-09-29 LAB — B12 AND FOLATE PANEL
Folate: 17.2 ng/mL
Vitamin B-12: 298 pg/mL (ref 200–1100)

## 2019-09-29 LAB — LIPID PANEL
Cholesterol: 140 mg/dL (ref <200)
HDL: 59 mg/dL (ref >=50)
LDL Cholesterol (Calc): 57 mg/dL (calc)
Non-HDL Cholesterol (Calc): 81 mg/dL (calc) (ref <130)
Total CHOL/HDL Ratio: 2.4 (calc) (ref <5.0)
Triglycerides: 163 mg/dL — ABNORMAL HIGH (ref <150)

## 2019-10-02 ENCOUNTER — Ambulatory Visit (INDEPENDENT_AMBULATORY_CARE_PROVIDER_SITE_OTHER): Payer: Medicare Other | Admitting: Nurse Practitioner

## 2019-10-02 ENCOUNTER — Other Ambulatory Visit: Payer: Self-pay

## 2019-10-02 ENCOUNTER — Encounter: Payer: Self-pay | Admitting: Nurse Practitioner

## 2019-10-02 VITALS — BP 149/68 | HR 108 | Ht 64.0 in | Wt 158.0 lb

## 2019-10-02 DIAGNOSIS — I10 Essential (primary) hypertension: Secondary | ICD-10-CM | POA: Diagnosis not present

## 2019-10-02 DIAGNOSIS — Z2821 Immunization not carried out because of patient refusal: Secondary | ICD-10-CM | POA: Diagnosis not present

## 2019-10-02 DIAGNOSIS — J42 Unspecified chronic bronchitis: Secondary | ICD-10-CM | POA: Diagnosis not present

## 2019-10-02 DIAGNOSIS — F5105 Insomnia due to other mental disorder: Secondary | ICD-10-CM

## 2019-10-02 DIAGNOSIS — H6123 Impacted cerumen, bilateral: Secondary | ICD-10-CM | POA: Insufficient documentation

## 2019-10-02 DIAGNOSIS — N393 Stress incontinence (female) (male): Secondary | ICD-10-CM | POA: Diagnosis not present

## 2019-10-02 DIAGNOSIS — F99 Mental disorder, not otherwise specified: Secondary | ICD-10-CM

## 2019-10-02 NOTE — Progress Notes (Signed)
Established Patient Office Visit  Subjective:  Patient ID: Anita Krause, female    DOB: 13-Aug-1940  Age: 79 y.o. MRN: [id number]  CC: Ears full  HPI Anita Krause presents to have ears evaluated for cerumen impaction.   She would also like to review her annual labs which were drawn at her last visit and would like to discuss her upcoming medical appointments.   She reports that she had a bladder sling placed many years ago after her hysterectomy. She feels that she needs the bladder sling replaced. She reports for the past "few months" she has been "dribbling" urine when she coughs and sneezes. She would like a referral to someone who may be able to do this.   She reports that she is followed by Dr. Germain Osgood with pulmonology. She tells me she was recently placed on antibiotics for a lung infection. She has nearly completed these antibiotics and reports that her symptoms have resolved.   She tells me that she had a heart attack and was diagnosed with lung cancer in March of this year. She reports she underwent a cardiac catheterization and had a stent placed at the same time. She has been on blood thinner up until a week ago when she stopped it. She is still currently taking 81 mg of aspirin a day and she is taking triamterene-hydrochlorothiazide 37.5-25 mg twice a day. She does report that up until last week she had only been taking this medication once a day. She is concerned about the medication being taken on a twice a day basis and feels like it is too much medication for her however she does report that her blood pressures are typically in the 093O systolic at night and 671I in the morning when she wakes up before she takes her medication.  She tells me that she finished her radiation treatments for lung cancer last week. She sees Dr. Marylou Mccoy for oncology.  She reports that she sees Dr. Ferrel Logan for psychiatry. She reports that he has given her Xanax and hydroxyzine to help her sleep however  neither are effective. She has asking about an increased dose of Xanax to help her sleep.  She tells me that she is due to get her [place] COVID-19 booster shot in the next week or so.   Past Medical History:  Diagnosis Date  . AAA (abdominal aortic aneurysm) without rupture ([place]) 05/03/2017   3.4 cm May 2019  . Bipolar 1 disorder ([place])   . 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  . Chronic back pain 03/14/2017  . Chronic bronchitis ([place]) 09/21/2016  . COPD (chronic obstructive pulmonary disease) ([place])   . Hypertension     Past Surgical History:  Procedure Laterality Date  . ABDOMINAL HYSTERECTOMY    . ANEURYSM COILING    . BACK SURGERY    . CARDIAC SURGERY    . POLYPECTOMY     VOCAL CORD    Family History  Problem Relation Age of Onset  . Hyperlipidemia Mother   . Hypertension Mother   . Diabetes Mother   . Hyperlipidemia Father   . Hypertension Father   . Cancer Sister     Social History   Socioeconomic History  . Marital status: Divorced    Spouse name: Not on file  . Number of children: Not on file  . Years of education: Not on file  . Highest education level: Not on file  Occupational History  . Not on file  Tobacco Use  . Smoking status:  Current Every Day Smoker    Packs/day: 0.25    Types: Cigarettes  . Smokeless tobacco: Never Used  . Tobacco comment: 09/10/17  Vaping Use  . Vaping Use: Never used  Substance and Sexual Activity  . Alcohol use: No  . Drug use: No  . Sexual activity: Not Currently  Other Topics Concern  . Not on file  Social History Narrative  . Not on file   Social Determinants of Health   Financial Resource Strain:   . Difficulty of Paying Living Expenses: Not on file  Food Insecurity:   . Worried About Charity fundraiser in the Last Year: Not on file  . Ran Out of Food in the Last Year: Not on file  Transportation Needs:   . Lack of Transportation (Medical): Not on file  . Lack of Transportation (Non-Medical): Not on file  Physical  Activity:   . Days of Exercise per Week: Not on file  . Minutes of Exercise per Session: Not on file  Stress:   . Feeling of Stress : Not on file  Social Connections:   . Frequency of Communication with Friends and Family: Not on file  . Frequency of Social Gatherings with Friends and Family: Not on file  . Attends Religious Services: Not on file  . Active Member of Clubs or Organizations: Not on file  . Attends Archivist Meetings: Not on file  . Marital Status: Not on file  Intimate Partner Violence:   . Fear of Current or Ex-Partner: Not on file  . Emotionally Abused: Not on file  . Physically Abused: Not on file  . Sexually Abused: Not on file    Outpatient Medications Prior to Visit  Medication Sig Dispense Refill  . albuterol (PROVENTIL) (5 MG/ML) 0.5% nebulizer solution Take 0.5 mLs (2.5 mg total) by nebulization every 6 (six) hours as needed for wheezing or shortness of breath. 20 mL 1  . aspirin EC 81 MG tablet Take 81 mg by mouth daily. Swallow whole.    . benzonatate (TESSALON) 200 MG capsule Take 1 capsule (200 mg total) by mouth 3 (three) times daily as needed for cough. 90 capsule 6  . Budeson-Glycopyrrol-Formoterol (BREZTRI AEROSPHERE) 160-9-4.8 MCG/ACT AERO Inhale 2 puffs into the lungs 2 (two) times daily as needed.    . budesonide-formoterol (SYMBICORT) 80-4.5 MCG/ACT inhaler Inhale 2 puffs into the lungs in the morning and at bedtime. 10.2 g 12  . cyclobenzaprine (FLEXERIL) 5 MG tablet Take 1 tablet (5 mg total) by mouth 3 (three) times daily as needed for muscle spasms. 90 tablet 1  . hydrOXYzine (ATARAX/VISTARIL) 25 MG tablet Take 1 tablet (25 mg total) by mouth 2 (two) times daily as needed for anxiety. 30 tablet 5  . omeprazole (PRILOSEC) 40 MG capsule Take 1 capsule (40 mg total) by mouth daily. 30 capsule 11  . ondansetron (ZOFRAN ODT) 4 MG disintegrating tablet Take one tab by mouth Q6hr prn nausea.  Dissolve under tongue. 12 tablet 0  .  triamterene-hydrochlorothiazide (DYAZIDE) 37.5-25 MG capsule Take 1 each (1 capsule total) by mouth 2 (two) times daily. For high blood pressure 180 capsule 1   No facility-administered medications prior to visit.    Allergies  Allergen Reactions  . Erythromycin Anaphylaxis    Throat closes up, ling in mouth peals  . Nitrofurantoin Macrocrystal Anaphylaxis    Pt states her throat closes up  . Penicillins Anaphylaxis  . Colesevelam Hcl Other (See Comments)  . Doxycycline Rash  .  Lamotrigine Rash    ROS Review of Systems Denies shortness of breath, chest pain, cough worse than baseline. Denies vision trouble Denies difficulty hearing but does report that she frequently has cerumen impaction due to the presence of eczema and excess skin flaking in her ears. She denies palpitations or irregular heartbeats. She does report difficulty sleeping-sleeps from approximately midnight to 4 AM. She denies any changes to her bowel habits-she reports the use of vegetable laxatives on a daily basis. She does endorse some bladder leakage.     Objective:    Physical Exam Vitals and nursing note reviewed.  Constitutional:      Appearance: Normal appearance.  HENT:     Head: Normocephalic.     Right Ear: There is impacted cerumen.     Left Ear: There is impacted cerumen.  Eyes:     Extraocular Movements: Extraocular movements intact.     Conjunctiva/sclera: Conjunctivae normal.     Pupils: Pupils are equal, round, and reactive to light.  Neck:     Vascular: No carotid bruit.  Cardiovascular:     Rate and Rhythm: Normal rate and regular rhythm.     Pulses: Normal pulses.     Heart sounds: Normal heart sounds.  Pulmonary:     Effort: Pulmonary effort is normal.     Breath sounds: Wheezing present.  Abdominal:     General: Abdomen is flat. Bowel sounds are normal. There is no distension.     Palpations: Abdomen is soft.     Tenderness: There is no abdominal tenderness. There is no  right CVA tenderness or left CVA tenderness.  Musculoskeletal:        General: Normal range of motion.     Cervical back: Normal range of motion.     Right lower leg: No edema.     Left lower leg: No edema.  Skin:    General: Skin is warm and dry.     Capillary Refill: Capillary refill takes less than 2 seconds.  Neurological:     General: No focal deficit present.     Mental Status: She is alert and oriented to person, place, and time.  Psychiatric:        Mood and Affect: Mood normal.        Behavior: Behavior normal.        Thought Content: Thought content normal.        Judgment: Judgment normal.     BP (!) 149/68   Pulse (!) 108   Ht 5\' 4"  (1.626 m)   Wt 158 lb (71.7 kg)   BMI 27.12 kg/m  Wt Readings from Last 3 Encounters:  10/02/19 158 lb (71.7 kg)  09/18/19 159 lb (72.1 kg)  09/04/19 161 lb 1.6 oz (73.1 kg)     Health Maintenance Due  Topic Date Due  . Hepatitis C Screening  Never done    There are no preventive care reminders to display for this patient.  Lab Results  Component Value Date   TSH 2.48 09/29/2019   Lab Results  Component Value Date   WBC 5.7 09/29/2019   HGB 13.9 09/29/2019   HCT 41.3 09/29/2019   MCV 87.1 09/29/2019   PLT 156 09/29/2019   Lab Results  Component Value Date   NA 140 09/29/2019   K 4.0 09/29/2019   CO2 32 09/29/2019   GLUCOSE 94 09/29/2019   BUN 14 09/29/2019   CREATININE 0.94 (H) 09/29/2019   BILITOT 0.5 09/29/2019   ALKPHOS  53 08/28/2016   AST 17 09/29/2019   ALT 16 09/29/2019   PROT 6.2 09/29/2019   ALBUMIN 4.3 08/28/2016   CALCIUM 9.5 09/29/2019   ANIONGAP 12 06/28/2018   Lab Results  Component Value Date   CHOL 140 09/29/2019   Lab Results  Component Value Date   HDL 59 09/29/2019   Lab Results  Component Value Date   LDLCALC 57 09/29/2019   Lab Results  Component Value Date   TRIG 163 (H) 09/29/2019   Lab Results  Component Value Date   CHOLHDL 2.4 09/29/2019   Lab Results  Component  Value Date   HGBA1C 5.3 10/23/2018      Assessment & Plan:   1. Bilateral impacted cerumen Ceruminosis is noted.   Wax removed by warm water irrigation and manual debridement with soft currette.  Ears clear with TM and auditory canal noted WNL post irrigation.  Instructions for home care to prevent wax buildup are given.  2. Hypertension, unspecified type Patient is hypertensive in the office today. She reports that this is due to rushing into the office today. She reports that she did take her blood pressure medication this morning around 4 AM. We did discuss that this blood pressure medication must be taken at 12-hour intervals to be effective. We also discussed the mechanism of action of hydrochlorothiazide and the importance of potassium replacement. We also discussed that it is appropriate to utilize this medication in twice daily dosing and it is not an excessive use of the medication. Patient would like to talk to cardiologist about changing medication.  Refills offered today-patient declined until she has a chance to speak to the cardiologist.   3. Chronic bronchitis, unspecified chronic bronchitis type ([place]) Chronic bronchitis with use of albuterol and Symbicort inhaler daily. She is currently taking an antibiotic for suspected infection given by Dr. Germain Osgood in pulmonology. She is unable to remember the name of this antibiotic at this time. Her lungs are clear today with minimal wheezing noted in the bases. Most likely this is her baseline given her significant history and current smoking history.   4. Insomnia due to other mental disorder Patient reports difficulty sleeping despite use of Xanax and hydroxyzine nightly. Discussed with patient the importance of not utilizing benzodiazepines in patients older than 79 years old due to the increased risk of respiratory depression and changes in mental status. Also discussed with the patient that Xanax is not typically used as a sleep  aid but more as an acute treatment of anxiety symptoms. Denied increased dose of Xanax today. Recommend that the patient discuss this with psychiatry as this is the current prescriber of this medication.   5. Influenza vaccination declined by patient Patient is due for influenza vaccine today. She declines this today.   6. 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 Recommend referral to either urology or gynecology for evaluation. Will send referral to gynecology based on patient's preference.  Discussed lab results with patient. Effectively most labs were unremarkable. Did discuss that her B12 was within normal limits and therefore she would not be eligible for a B12 injection. She expressed understanding.  Follow-up with PCP in approximately 6 months for chronic disease management. She will most likely need repeat labs at that time.  55 minutes of time spent with patient with greater than 50% of this time spent on  education and review of previous lab work and medical history.  Orma Render, NP

## 2019-10-02 NOTE — Patient Instructions (Addendum)
I will ask around to find a good doctor who does a good job with bladder sling.   Let us know if you need more blood pressure medication sent in.    Health Maintenance After Age 79 After age 24, you are at a higher risk for certain long-term diseases and infections as well as injuries from falls. Falls are a major cause of broken bones and head injuries in people who are older than age 90. Getting regular preventive care can help to keep you healthy and well. Preventive care includes getting regular testing and making lifestyle changes as recommended by your health care provider. Talk with your health care provider about:  Which screenings and tests you should have. A screening is a test that checks for a disease when you have no symptoms.  A diet and exercise plan that is right for you. What should I know about screenings and tests to prevent falls? Screening and testing are the best ways to find a health problem Rabecka Brendel. Walton Digilio diagnosis and treatment give you the best chance of managing medical conditions that are common after age 50. Certain conditions and lifestyle choices may make you more likely to have a fall. Your health care provider may recommend:  Regular vision checks. Poor vision and conditions such as cataracts can make you more likely to have a fall. If you wear glasses, make sure to get your prescription updated if your vision changes.  Medicine review. Work with your health care provider to regularly review all of the medicines you are taking, including over-the-counter medicines. Ask your health care provider about any side effects that may make you more likely to have a fall. Tell your health care provider if any medicines that you take make you feel dizzy or sleepy.  Osteoporosis screening. Osteoporosis is a condition that causes the bones to get weaker. This can make the bones weak and cause them to break more easily.  Blood pressure screening. Blood pressure changes and  medicines to control blood pressure can make you feel dizzy.  Strength and balance checks. Your health care provider may recommend certain tests to check your strength and balance while standing, walking, or changing positions.  Foot health exam. Foot pain and numbness, as well as not wearing proper footwear, can make you more likely to have a fall.  Depression screening. You may be more likely to have a fall if you have a fear of falling, feel emotionally low, or feel unable to do activities that you used to do.  Alcohol use screening. Using too much alcohol can affect your balance and may make you more likely to have a fall. What actions can I take to lower my risk of falls? General instructions  Talk with your health care provider about your risks for falling. Tell your health care provider if: ? You fall. Be sure to tell your health care provider about all falls, even ones that seem minor. ? You feel dizzy, sleepy, or off-balance.  Take over-the-counter and prescription medicines only as told by your health care provider. These include any supplements.  Eat a healthy diet and maintain a healthy weight. A healthy diet includes low-fat dairy products, low-fat (lean) meats, and fiber from whole grains, beans, and lots of fruits and vegetables. Home safety  Remove any tripping hazards, such as rugs, cords, and clutter.  Install safety equipment such as grab bars in bathrooms and safety rails on stairs.  Keep rooms and walkways well-lit. Activity   Follow  a regular exercise program to stay fit. This will help you maintain your balance. Ask your health care provider what types of exercise are appropriate for you.  If you need a cane or walker, use it as recommended by your health care provider.  Wear supportive shoes that have nonskid soles. Lifestyle  Do not drink alcohol if your health care provider tells you not to drink.  If you drink alcohol, limit how much you have: ? 0-1  drink a day for women. ? 0-2 drinks a day for men.  Be aware of how much alcohol is in your drink. In the [place] one drink equals one typical bottle of beer (12 oz), one-half glass of wine (5 oz), or one shot of hard liquor (1 oz).  Do not use any products that contain nicotine or tobacco, such as cigarettes and e-cigarettes. If you need help quitting, ask your health care provider. Summary  Having a healthy lifestyle and getting preventive care can help to protect your health and wellness after age 39.  Screening and testing are the best way to find a health problem Elyanah Farino and help you avoid having a fall. Shayonna Ocampo diagnosis and treatment give you the best chance for managing medical conditions that are more common for people who are older than age 45.  Falls are a major cause of broken bones and head injuries in people who are older than age 1. Take precautions to prevent a fall at home.  Work with your health care provider to learn what changes you can make to improve your health and wellness and to prevent falls. This information is not intended to replace advice given to you by your health care provider. Make sure you discuss any questions you have with your health care provider. Document Revised: 04/11/2018 Document Reviewed: 11/01/2016 Elsevier Patient Education  2020 [employer].

## 2019-10-08 DIAGNOSIS — I10 Essential (primary) hypertension: Secondary | ICD-10-CM | POA: Diagnosis not present

## 2019-10-08 DIAGNOSIS — I2510 Atherosclerotic heart disease of native coronary artery without angina pectoris: Secondary | ICD-10-CM | POA: Diagnosis not present

## 2019-10-08 DIAGNOSIS — Z955 Presence of coronary angioplasty implant and graft: Secondary | ICD-10-CM | POA: Diagnosis not present

## 2019-10-09 DIAGNOSIS — L728 Other follicular cysts of the skin and subcutaneous tissue: Secondary | ICD-10-CM | POA: Diagnosis not present

## 2019-10-09 DIAGNOSIS — D485 Neoplasm of uncertain behavior of skin: Secondary | ICD-10-CM | POA: Diagnosis not present

## 2019-10-09 DIAGNOSIS — L820 Inflamed seborrheic keratosis: Secondary | ICD-10-CM | POA: Diagnosis not present

## 2019-10-10 DIAGNOSIS — R0600 Dyspnea, unspecified: Secondary | ICD-10-CM | POA: Diagnosis not present

## 2019-10-10 DIAGNOSIS — F17200 Nicotine dependence, unspecified, uncomplicated: Secondary | ICD-10-CM | POA: Diagnosis not present

## 2019-10-10 DIAGNOSIS — J441 Chronic obstructive pulmonary disease with (acute) exacerbation: Secondary | ICD-10-CM | POA: Diagnosis not present

## 2019-10-10 DIAGNOSIS — C3411 Malignant neoplasm of upper lobe, right bronchus or lung: Secondary | ICD-10-CM | POA: Diagnosis not present

## 2019-10-13 ENCOUNTER — Telehealth: Payer: Self-pay | Admitting: *Deleted

## 2019-10-13 NOTE — Telephone Encounter (Signed)
Left patient an urgent message to call the office to reschedule with Dr. Rosana Hoes on 10/30/2019 at either 11:00 AM or 1:15 PM, if possible or another day. Dr. Elly Modena sent a message that she does not handle these types of appointments. These are the only times available to the patient due to other [facility] on Dr. Rosana Hoes schedule that day, do not schedule back to back due to them being 30 min appointments.

## 2019-10-16 ENCOUNTER — Encounter: Payer: Medicare Other | Admitting: Obstetrics and Gynecology

## 2019-10-16 DIAGNOSIS — B370 Candidal stomatitis: Secondary | ICD-10-CM | POA: Diagnosis not present

## 2019-10-16 DIAGNOSIS — F17210 Nicotine dependence, cigarettes, uncomplicated: Secondary | ICD-10-CM | POA: Diagnosis not present

## 2019-10-16 DIAGNOSIS — J449 Chronic obstructive pulmonary disease, unspecified: Secondary | ICD-10-CM | POA: Diagnosis not present

## 2019-10-16 DIAGNOSIS — C3411 Malignant neoplasm of upper lobe, right bronchus or lung: Secondary | ICD-10-CM | POA: Diagnosis not present

## 2019-10-20 DIAGNOSIS — Z87891 Personal history of nicotine dependence: Secondary | ICD-10-CM | POA: Diagnosis not present

## 2019-10-20 DIAGNOSIS — N393 Stress incontinence (female) (male): Secondary | ICD-10-CM | POA: Diagnosis not present

## 2019-10-20 DIAGNOSIS — J449 Chronic obstructive pulmonary disease, unspecified: Secondary | ICD-10-CM | POA: Diagnosis not present

## 2019-10-20 DIAGNOSIS — G4700 Insomnia, unspecified: Secondary | ICD-10-CM | POA: Diagnosis not present

## 2019-10-20 DIAGNOSIS — C3411 Malignant neoplasm of upper lobe, right bronchus or lung: Secondary | ICD-10-CM | POA: Diagnosis not present

## 2019-10-20 DIAGNOSIS — Z79899 Other long term (current) drug therapy: Secondary | ICD-10-CM | POA: Diagnosis not present

## 2019-10-20 DIAGNOSIS — Z7185 Encounter for immunization safety counseling: Secondary | ICD-10-CM | POA: Diagnosis not present

## 2019-10-20 DIAGNOSIS — I10 Essential (primary) hypertension: Secondary | ICD-10-CM | POA: Diagnosis not present

## 2019-10-21 ENCOUNTER — Other Ambulatory Visit: Payer: Self-pay

## 2019-10-22 DIAGNOSIS — J449 Chronic obstructive pulmonary disease, unspecified: Secondary | ICD-10-CM | POA: Diagnosis not present

## 2019-10-22 DIAGNOSIS — F17210 Nicotine dependence, cigarettes, uncomplicated: Secondary | ICD-10-CM | POA: Diagnosis not present

## 2019-10-22 DIAGNOSIS — R062 Wheezing: Secondary | ICD-10-CM | POA: Diagnosis not present

## 2019-10-22 DIAGNOSIS — R059 Cough, unspecified: Secondary | ICD-10-CM | POA: Diagnosis not present

## 2019-10-28 DIAGNOSIS — F17210 Nicotine dependence, cigarettes, uncomplicated: Secondary | ICD-10-CM | POA: Diagnosis not present

## 2019-10-28 DIAGNOSIS — F319 Bipolar disorder, unspecified: Secondary | ICD-10-CM | POA: Diagnosis not present

## 2019-10-28 DIAGNOSIS — Z888 Allergy status to other drugs, medicaments and biological substances status: Secondary | ICD-10-CM | POA: Diagnosis not present

## 2019-10-28 DIAGNOSIS — J441 Chronic obstructive pulmonary disease with (acute) exacerbation: Secondary | ICD-10-CM | POA: Diagnosis not present

## 2019-10-28 DIAGNOSIS — I10 Essential (primary) hypertension: Secondary | ICD-10-CM | POA: Diagnosis not present

## 2019-10-28 DIAGNOSIS — Z881 Allergy status to other antibiotic agents status: Secondary | ICD-10-CM | POA: Diagnosis not present

## 2019-10-28 DIAGNOSIS — Z923 Personal history of irradiation: Secondary | ICD-10-CM | POA: Diagnosis not present

## 2019-10-28 DIAGNOSIS — Z792 Long term (current) use of antibiotics: Secondary | ICD-10-CM | POA: Diagnosis not present

## 2019-10-28 DIAGNOSIS — Z880 Allergy status to penicillin: Secondary | ICD-10-CM | POA: Diagnosis not present

## 2019-10-28 DIAGNOSIS — Z7902 Long term (current) use of antithrombotics/antiplatelets: Secondary | ICD-10-CM | POA: Diagnosis not present

## 2019-10-28 DIAGNOSIS — G4700 Insomnia, unspecified: Secondary | ICD-10-CM | POA: Diagnosis not present

## 2019-10-28 DIAGNOSIS — Z7982 Long term (current) use of aspirin: Secondary | ICD-10-CM | POA: Diagnosis not present

## 2019-10-28 DIAGNOSIS — R0602 Shortness of breath: Secondary | ICD-10-CM | POA: Diagnosis not present

## 2019-10-28 DIAGNOSIS — J984 Other disorders of lung: Secondary | ICD-10-CM | POA: Diagnosis not present

## 2019-10-28 DIAGNOSIS — Z20822 Contact with and (suspected) exposure to covid-19: Secondary | ICD-10-CM | POA: Diagnosis not present

## 2019-10-28 DIAGNOSIS — Z85118 Personal history of other malignant neoplasm of bronchus and lung: Secondary | ICD-10-CM | POA: Diagnosis not present

## 2019-10-28 DIAGNOSIS — Z79899 Other long term (current) drug therapy: Secondary | ICD-10-CM | POA: Diagnosis not present

## 2019-10-28 DIAGNOSIS — R918 Other nonspecific abnormal finding of lung field: Secondary | ICD-10-CM | POA: Diagnosis not present

## 2019-10-29 DIAGNOSIS — R059 Cough, unspecified: Secondary | ICD-10-CM | POA: Diagnosis not present

## 2019-10-29 DIAGNOSIS — F17200 Nicotine dependence, unspecified, uncomplicated: Secondary | ICD-10-CM | POA: Diagnosis not present

## 2019-10-29 DIAGNOSIS — I2510 Atherosclerotic heart disease of native coronary artery without angina pectoris: Secondary | ICD-10-CM | POA: Diagnosis not present

## 2019-10-29 DIAGNOSIS — Z955 Presence of coronary angioplasty implant and graft: Secondary | ICD-10-CM | POA: Diagnosis not present

## 2019-10-29 DIAGNOSIS — J3089 Other allergic rhinitis: Secondary | ICD-10-CM | POA: Diagnosis not present

## 2019-10-29 DIAGNOSIS — I10 Essential (primary) hypertension: Secondary | ICD-10-CM | POA: Diagnosis not present

## 2019-10-29 DIAGNOSIS — J449 Chronic obstructive pulmonary disease, unspecified: Secondary | ICD-10-CM | POA: Diagnosis not present

## 2019-10-30 ENCOUNTER — Encounter: Payer: Medicare Other | Admitting: Obstetrics and Gynecology

## 2019-11-05 DIAGNOSIS — E871 Hypo-osmolality and hyponatremia: Secondary | ICD-10-CM | POA: Diagnosis not present

## 2019-11-05 DIAGNOSIS — R0602 Shortness of breath: Secondary | ICD-10-CM | POA: Diagnosis not present

## 2019-11-05 DIAGNOSIS — I10 Essential (primary) hypertension: Secondary | ICD-10-CM | POA: Diagnosis not present

## 2019-11-05 DIAGNOSIS — J449 Chronic obstructive pulmonary disease, unspecified: Secondary | ICD-10-CM | POA: Diagnosis not present

## 2019-11-05 DIAGNOSIS — R059 Cough, unspecified: Secondary | ICD-10-CM | POA: Diagnosis not present

## 2019-11-05 DIAGNOSIS — Z20822 Contact with and (suspected) exposure to covid-19: Secondary | ICD-10-CM | POA: Diagnosis not present

## 2019-11-05 DIAGNOSIS — R918 Other nonspecific abnormal finding of lung field: Secondary | ICD-10-CM | POA: Diagnosis not present

## 2019-11-05 DIAGNOSIS — J9602 Acute respiratory failure with hypercapnia: Secondary | ICD-10-CM | POA: Diagnosis not present

## 2019-11-05 DIAGNOSIS — C3411 Malignant neoplasm of upper lobe, right bronchus or lung: Secondary | ICD-10-CM | POA: Diagnosis not present

## 2019-11-05 DIAGNOSIS — R531 Weakness: Secondary | ICD-10-CM | POA: Diagnosis not present

## 2019-11-05 DIAGNOSIS — J168 Pneumonia due to other specified infectious organisms: Secondary | ICD-10-CM | POA: Diagnosis not present

## 2019-11-05 DIAGNOSIS — R0902 Hypoxemia: Secondary | ICD-10-CM | POA: Diagnosis not present

## 2019-11-05 DIAGNOSIS — F17210 Nicotine dependence, cigarettes, uncomplicated: Secondary | ICD-10-CM | POA: Diagnosis not present

## 2019-11-05 DIAGNOSIS — F319 Bipolar disorder, unspecified: Secondary | ICD-10-CM | POA: Diagnosis not present

## 2019-11-05 DIAGNOSIS — J441 Chronic obstructive pulmonary disease with (acute) exacerbation: Secondary | ICD-10-CM | POA: Diagnosis not present

## 2019-11-06 DIAGNOSIS — J449 Chronic obstructive pulmonary disease, unspecified: Secondary | ICD-10-CM | POA: Diagnosis not present

## 2019-11-06 DIAGNOSIS — F17210 Nicotine dependence, cigarettes, uncomplicated: Secondary | ICD-10-CM | POA: Diagnosis present

## 2019-11-06 DIAGNOSIS — I10 Essential (primary) hypertension: Secondary | ICD-10-CM | POA: Diagnosis present

## 2019-11-06 DIAGNOSIS — E871 Hypo-osmolality and hyponatremia: Secondary | ICD-10-CM | POA: Diagnosis not present

## 2019-11-06 DIAGNOSIS — Z85118 Personal history of other malignant neoplasm of bronchus and lung: Secondary | ICD-10-CM | POA: Diagnosis not present

## 2019-11-06 DIAGNOSIS — Z888 Allergy status to other drugs, medicaments and biological substances status: Secondary | ICD-10-CM | POA: Diagnosis not present

## 2019-11-06 DIAGNOSIS — Z880 Allergy status to penicillin: Secondary | ICD-10-CM | POA: Diagnosis not present

## 2019-11-06 DIAGNOSIS — J9602 Acute respiratory failure with hypercapnia: Secondary | ICD-10-CM | POA: Diagnosis not present

## 2019-11-06 DIAGNOSIS — F319 Bipolar disorder, unspecified: Secondary | ICD-10-CM | POA: Diagnosis present

## 2019-11-06 DIAGNOSIS — Z79899 Other long term (current) drug therapy: Secondary | ICD-10-CM | POA: Diagnosis not present

## 2019-11-06 DIAGNOSIS — Z7982 Long term (current) use of aspirin: Secondary | ICD-10-CM | POA: Diagnosis not present

## 2019-11-06 DIAGNOSIS — G4700 Insomnia, unspecified: Secondary | ICD-10-CM | POA: Diagnosis present

## 2019-11-06 DIAGNOSIS — Z20822 Contact with and (suspected) exposure to covid-19: Secondary | ICD-10-CM | POA: Diagnosis present

## 2019-11-06 DIAGNOSIS — J441 Chronic obstructive pulmonary disease with (acute) exacerbation: Secondary | ICD-10-CM | POA: Diagnosis not present

## 2019-11-06 DIAGNOSIS — Z881 Allergy status to other antibiotic agents status: Secondary | ICD-10-CM | POA: Diagnosis not present

## 2019-11-07 DIAGNOSIS — J441 Chronic obstructive pulmonary disease with (acute) exacerbation: Secondary | ICD-10-CM | POA: Diagnosis not present

## 2019-11-07 DIAGNOSIS — I10 Essential (primary) hypertension: Secondary | ICD-10-CM | POA: Diagnosis not present

## 2019-11-07 DIAGNOSIS — E871 Hypo-osmolality and hyponatremia: Secondary | ICD-10-CM | POA: Diagnosis not present

## 2019-11-07 DIAGNOSIS — J9602 Acute respiratory failure with hypercapnia: Secondary | ICD-10-CM | POA: Diagnosis not present

## 2019-11-11 ENCOUNTER — Ambulatory Visit: Payer: Medicare Other | Admitting: Osteopathic Medicine

## 2019-11-11 ENCOUNTER — Encounter: Payer: Self-pay | Admitting: Osteopathic Medicine

## 2019-11-11 ENCOUNTER — Ambulatory Visit (INDEPENDENT_AMBULATORY_CARE_PROVIDER_SITE_OTHER): Payer: Medicare Other | Admitting: Osteopathic Medicine

## 2019-11-11 VITALS — BP 127/70 | HR 76 | Temp 98.2°F | Wt 160.0 lb

## 2019-11-11 DIAGNOSIS — J42 Unspecified chronic bronchitis: Secondary | ICD-10-CM | POA: Diagnosis not present

## 2019-11-11 DIAGNOSIS — F3177 Bipolar disorder, in partial remission, most recent episode mixed: Secondary | ICD-10-CM

## 2019-11-11 DIAGNOSIS — J441 Chronic obstructive pulmonary disease with (acute) exacerbation: Secondary | ICD-10-CM

## 2019-11-11 DIAGNOSIS — J9611 Chronic respiratory failure with hypoxia: Secondary | ICD-10-CM | POA: Diagnosis not present

## 2019-11-11 DIAGNOSIS — R059 Cough, unspecified: Secondary | ICD-10-CM | POA: Diagnosis not present

## 2019-11-11 DIAGNOSIS — I10 Essential (primary) hypertension: Secondary | ICD-10-CM | POA: Diagnosis not present

## 2019-11-11 DIAGNOSIS — R81 Glycosuria: Secondary | ICD-10-CM | POA: Diagnosis not present

## 2019-11-11 DIAGNOSIS — F17210 Nicotine dependence, cigarettes, uncomplicated: Secondary | ICD-10-CM | POA: Diagnosis not present

## 2019-11-11 DIAGNOSIS — E871 Hypo-osmolality and hyponatremia: Secondary | ICD-10-CM | POA: Diagnosis not present

## 2019-11-11 DIAGNOSIS — K219 Gastro-esophageal reflux disease without esophagitis: Secondary | ICD-10-CM | POA: Diagnosis not present

## 2019-11-11 MED ORDER — AMBULATORY NON FORMULARY MEDICATION: 99 refills | Status: DC

## 2019-11-11 MED ORDER — ONDANSETRON 4 MG PO TBDP
ORAL_TABLET | ORAL | 0 refills | Status: DC
Start: 2019-11-11 — End: 2019-12-22

## 2019-11-11 MED ORDER — TRIAMTERENE-HCTZ 37.5-25 MG PO CAPS: 1.0000 | ORAL_CAPSULE | Freq: Every day | ORAL | Status: DC

## 2019-11-11 MED ORDER — OMEPRAZOLE 40 MG PO CPDR: 40.0000 mg | DELAYED_RELEASE_CAPSULE | Freq: Every day | ORAL | 3 refills | Status: DC

## 2019-11-11 MED ORDER — BENZONATATE 200 MG PO CAPS: 200.0000 mg | ORAL_CAPSULE | Freq: Two times a day (BID) | ORAL | Status: DC | PRN

## 2019-11-11 MED ORDER — ALPRAZOLAM 0.5 MG PO TABS
0.5000 mg | ORAL_TABLET | Freq: Every day | ORAL | 0 refills | Status: DC | PRN
Start: 2019-11-11 — End: 2019-12-16

## 2019-11-13 NOTE — Progress Notes (Signed)
Anita Krause is a 79 y.o. female who presents to  [place] at [facility]  today, 11/11/19, seeking care for the following:  . HFU - reviewed records, admitted for COPD exacerbation. Reports improvement after hospitalization/discharge. Has O2 at home. Medication reconciliation performed today. Daughter accompanies patient, requests Rx for walker, shower chair, toilet seat riser.  . On exam, coarse breath sounds diffusely, CV RRR normal S1S2. =1-2 LE edema at ankles. No JVD.      ASSESSMENT & PLAN with other pertinent findings:  The primary encounter diagnosis was COPD exacerbation ([place]). Diagnoses of Chronic bronchitis, unspecified chronic bronchitis type ([place]), Hypertension, unspecified type, Bipolar 1 disorder, mixed, partial remission ([place]), Glycosuria, Cough, Gastroesophageal reflux disease, unspecified whether esophagitis present, and Hyponatremia were also pertinent to this visit.   Rx refilled Follow w/ pulmonary and psychiatry - limited refill on alprazolam   No results found for this or any previous visit (from the past 24 hour(s)).  There are no Patient Instructions on file for this visit.  Orders Placed This Encounter  Procedures  . COMPLETE METABOLIC PANEL WITH GFR  . Hemoglobin A1c    Meds ordered this encounter  Medications  . AMBULATORY NON FORMULARY MEDICATION    Sig: Toilet seat riser Dx ambulatory dysfunction, COPD    Dispense:  1 Units    Refill:  prn  . AMBULATORY NON FORMULARY MEDICATION    Sig: Walker Dx ambulatory dysfunction, COPD    Dispense:  1 Units    Refill:  prn  . AMBULATORY NON FORMULARY MEDICATION    Sig: Shower chair dx ambulatory dysfunction, COPD    Dispense:  1 Units    Refill:  prn  . benzonatate (TESSALON) 200 MG capsule    Sig: Take 1 capsule (200 mg total) by mouth 2 (two) times daily as needed for cough.  . triamterene-hydrochlorothiazide (DYAZIDE) 37.5-25 MG capsule    Sig: Take 1  each (1 capsule total) by mouth daily. For high blood pressure  . omeprazole (PRILOSEC) 40 MG capsule    Sig: Take 1 capsule (40 mg total) by mouth daily.    Dispense:  90 capsule    Refill:  3  . ondansetron (ZOFRAN ODT) 4 MG disintegrating tablet    Sig: Take one tab by mouth Q6hr prn nausea.  Dissolve under tongue.    Dispense:  60 tablet    Refill:  0  . ALPRAZolam (XANAX) 0.5 MG tablet    Sig: Take 1 tablet (0.5 mg total) by mouth daily as needed. REFILLS THROUGH PSYCHIATRY    Dispense:  7 tablet    Refill:  0       Follow-up instructions: Return in about 6 months (around 05/10/2020) for ANNUAL CHECK-UP - SEE [place] SOONER IF NEEDED.                                         BP 127/70 (BP Location: Left Arm, Patient Position: Sitting, Cuff Size: Normal)   Pulse 76   Temp 98.2 F (36.8 C) (Oral)   Wt 160 lb (72.6 kg)   BMI 27.46 kg/m   Current Meds  Medication Sig  . albuterol (PROVENTIL) (5 MG/ML) 0.5% nebulizer solution Take 0.5 mLs (2.5 mg total) by nebulization every 6 (six) hours as needed for wheezing or shortness of breath.  Marland Kitchen albuterol (VENTOLIN HFA) 108 (90 Base)  MCG/ACT inhaler Inhale 2 puffs into the lungs every 6 (six) hours as needed for wheezing or shortness of breath.  . ALPRAZolam (XANAX) 0.5 MG tablet Take 1 tablet (0.5 mg total) by mouth daily as needed. REFILLS THROUGH PSYCHIATRY  . AMBULATORY NON FORMULARY MEDICATION Toilet seat riser Dx ambulatory dysfunction, COPD  . AMBULATORY NON FORMULARY MEDICATION Walker Dx ambulatory dysfunction, COPD  . AMBULATORY NON FORMULARY MEDICATION Shower chair dx ambulatory dysfunction, COPD  . aspirin EC 81 MG tablet Take 81 mg by mouth daily. Swallow whole.  . benzonatate (TESSALON) 200 MG capsule Take 1 capsule (200 mg total) by mouth 2 (two) times daily as needed for cough.  . Budeson-Glycopyrrol-Formoterol (BREZTRI AEROSPHERE) 160-9-4.8 MCG/ACT AERO Inhale 2 puffs into the lungs in the  morning and at bedtime.   . carvedilol (COREG) 6.25 MG tablet Take 6.25 mg by mouth 2 (two) times daily with a meal.   . cetirizine (ZYRTEC) 10 MG tablet Take 10 mg by mouth daily.  . Cholecalciferol 25 MCG (1000 UT) tablet Take 1,000 Units by mouth daily.  . clopidogrel (PLAVIX) 75 MG tablet TAKE 1 TABLET(75 MG) BY MOUTH DAILY  . cyclobenzaprine (FLEXERIL) 5 MG tablet Take 1 tablet (5 mg total) by mouth 3 (three) times daily as needed for muscle spasms.  . fluticasone (FLONASE) 50 MCG/ACT nasal spray Place 2 sprays into both nostrils daily.  . hydrOXYzine (ATARAX/VISTARIL) 25 MG tablet Take 1 tablet (25 mg total) by mouth 2 (two) times daily as needed for anxiety.  . Misc. Devices MISC Chest vest - Smart Vest for respiratory clearance.  Severe COPD.  Marland Kitchen montelukast (SINGULAIR) 10 MG tablet Take 10 mg by mouth at bedtime.  Marland Kitchen nystatin (MYCOSTATIN) 100000 UNIT/ML suspension Take 5 mLs by mouth 4 (four) times daily.  Marland Kitchen omeprazole (PRILOSEC) 40 MG capsule Take 1 capsule (40 mg total) by mouth daily.  . ondansetron (ZOFRAN ODT) 4 MG disintegrating tablet Take one tab by mouth Q6hr prn nausea.  Dissolve under tongue.  . predniSONE (DELTASONE) 10 MG tablet Take 4 tablets for 3 days, Take 3 tablets for 3 days, Take 2 tablets for 3 days, Take 1 tablet for 3 days  . risperiDONE (RISPERDAL) 1 MG tablet Take 1 mg by mouth at bedtime.  . rosuvastatin (CRESTOR) 40 MG tablet Take 40 mg by mouth daily.  Marland Kitchen triamterene-hydrochlorothiazide (DYAZIDE) 37.5-25 MG capsule Take 1 each (1 capsule total) by mouth daily. For high blood pressure  . [DISCONTINUED] ALPRAZolam (XANAX) 0.5 MG tablet Take 0.5 mg by mouth daily as needed.  . [DISCONTINUED] benzonatate (TESSALON) 200 MG capsule Take 1 capsule (200 mg total) by mouth 3 (three) times daily as needed for cough. (Patient taking differently: Take 200 mg by mouth 2 (two) times daily as needed for cough. )  . [DISCONTINUED] budesonide-formoterol (SYMBICORT) 80-4.5 MCG/ACT  inhaler Inhale 2 puffs into the lungs in the morning and at bedtime.  . [DISCONTINUED] omeprazole (PRILOSEC) 40 MG capsule Take 1 capsule (40 mg total) by mouth daily.  . [DISCONTINUED] ondansetron (ZOFRAN ODT) 4 MG disintegrating tablet Take one tab by mouth Q6hr prn nausea.  Dissolve under tongue.  . [DISCONTINUED] triamterene-hydrochlorothiazide (DYAZIDE) 37.5-25 MG capsule Take 1 each (1 capsule total) by mouth 2 (two) times daily. For high blood pressure (Patient taking differently: Take 1 capsule by mouth daily. For high blood pressure)    No results found for this or any previous visit (from the past 72 hour(s)).  No results found.  All questions at time of visit were answered - patient instructed to contact office with any additional concerns or updates.  ER/RTC precautions were reviewed with the patient as applicable.   Please note: voice recognition software was used to produce this document, and typos may escape review. Please contact Dr. Sheppard Coil for any needed clarifications.   Total encounter time: 40 minutes face to to face counseling and coordinating care, performing medication reconciliation, discussion w/ family.

## 2019-11-18 DIAGNOSIS — F5101 Primary insomnia: Secondary | ICD-10-CM | POA: Diagnosis not present

## 2019-11-18 DIAGNOSIS — K219 Gastro-esophageal reflux disease without esophagitis: Secondary | ICD-10-CM | POA: Diagnosis not present

## 2019-11-18 DIAGNOSIS — F319 Bipolar disorder, unspecified: Secondary | ICD-10-CM | POA: Diagnosis not present

## 2019-11-18 DIAGNOSIS — J441 Chronic obstructive pulmonary disease with (acute) exacerbation: Secondary | ICD-10-CM | POA: Diagnosis not present

## 2019-11-18 DIAGNOSIS — F3110 Bipolar disorder, current episode manic without psychotic features, unspecified: Secondary | ICD-10-CM | POA: Diagnosis not present

## 2019-11-18 DIAGNOSIS — E871 Hypo-osmolality and hyponatremia: Secondary | ICD-10-CM | POA: Diagnosis not present

## 2019-11-18 DIAGNOSIS — F411 Generalized anxiety disorder: Secondary | ICD-10-CM | POA: Diagnosis not present

## 2019-11-18 DIAGNOSIS — J449 Chronic obstructive pulmonary disease, unspecified: Secondary | ICD-10-CM | POA: Diagnosis not present

## 2019-11-18 DIAGNOSIS — I10 Essential (primary) hypertension: Secondary | ICD-10-CM | POA: Diagnosis not present

## 2019-11-20 DIAGNOSIS — K219 Gastro-esophageal reflux disease without esophagitis: Secondary | ICD-10-CM | POA: Diagnosis not present

## 2019-11-20 DIAGNOSIS — E871 Hypo-osmolality and hyponatremia: Secondary | ICD-10-CM | POA: Diagnosis not present

## 2019-11-20 DIAGNOSIS — F3110 Bipolar disorder, current episode manic without psychotic features, unspecified: Secondary | ICD-10-CM | POA: Diagnosis not present

## 2019-11-20 DIAGNOSIS — J441 Chronic obstructive pulmonary disease with (acute) exacerbation: Secondary | ICD-10-CM | POA: Diagnosis not present

## 2019-11-20 DIAGNOSIS — I10 Essential (primary) hypertension: Secondary | ICD-10-CM | POA: Diagnosis not present

## 2019-11-23 IMAGING — CR CHEST - 2 VIEW
2 series · 2 of 2 positions shown · non-contrast
Comparison: 03/07/2017, 11/24/2016

CLINICAL DATA: Shortness of breath

EXAM:
CHEST - 2 VIEW

[chest pa]
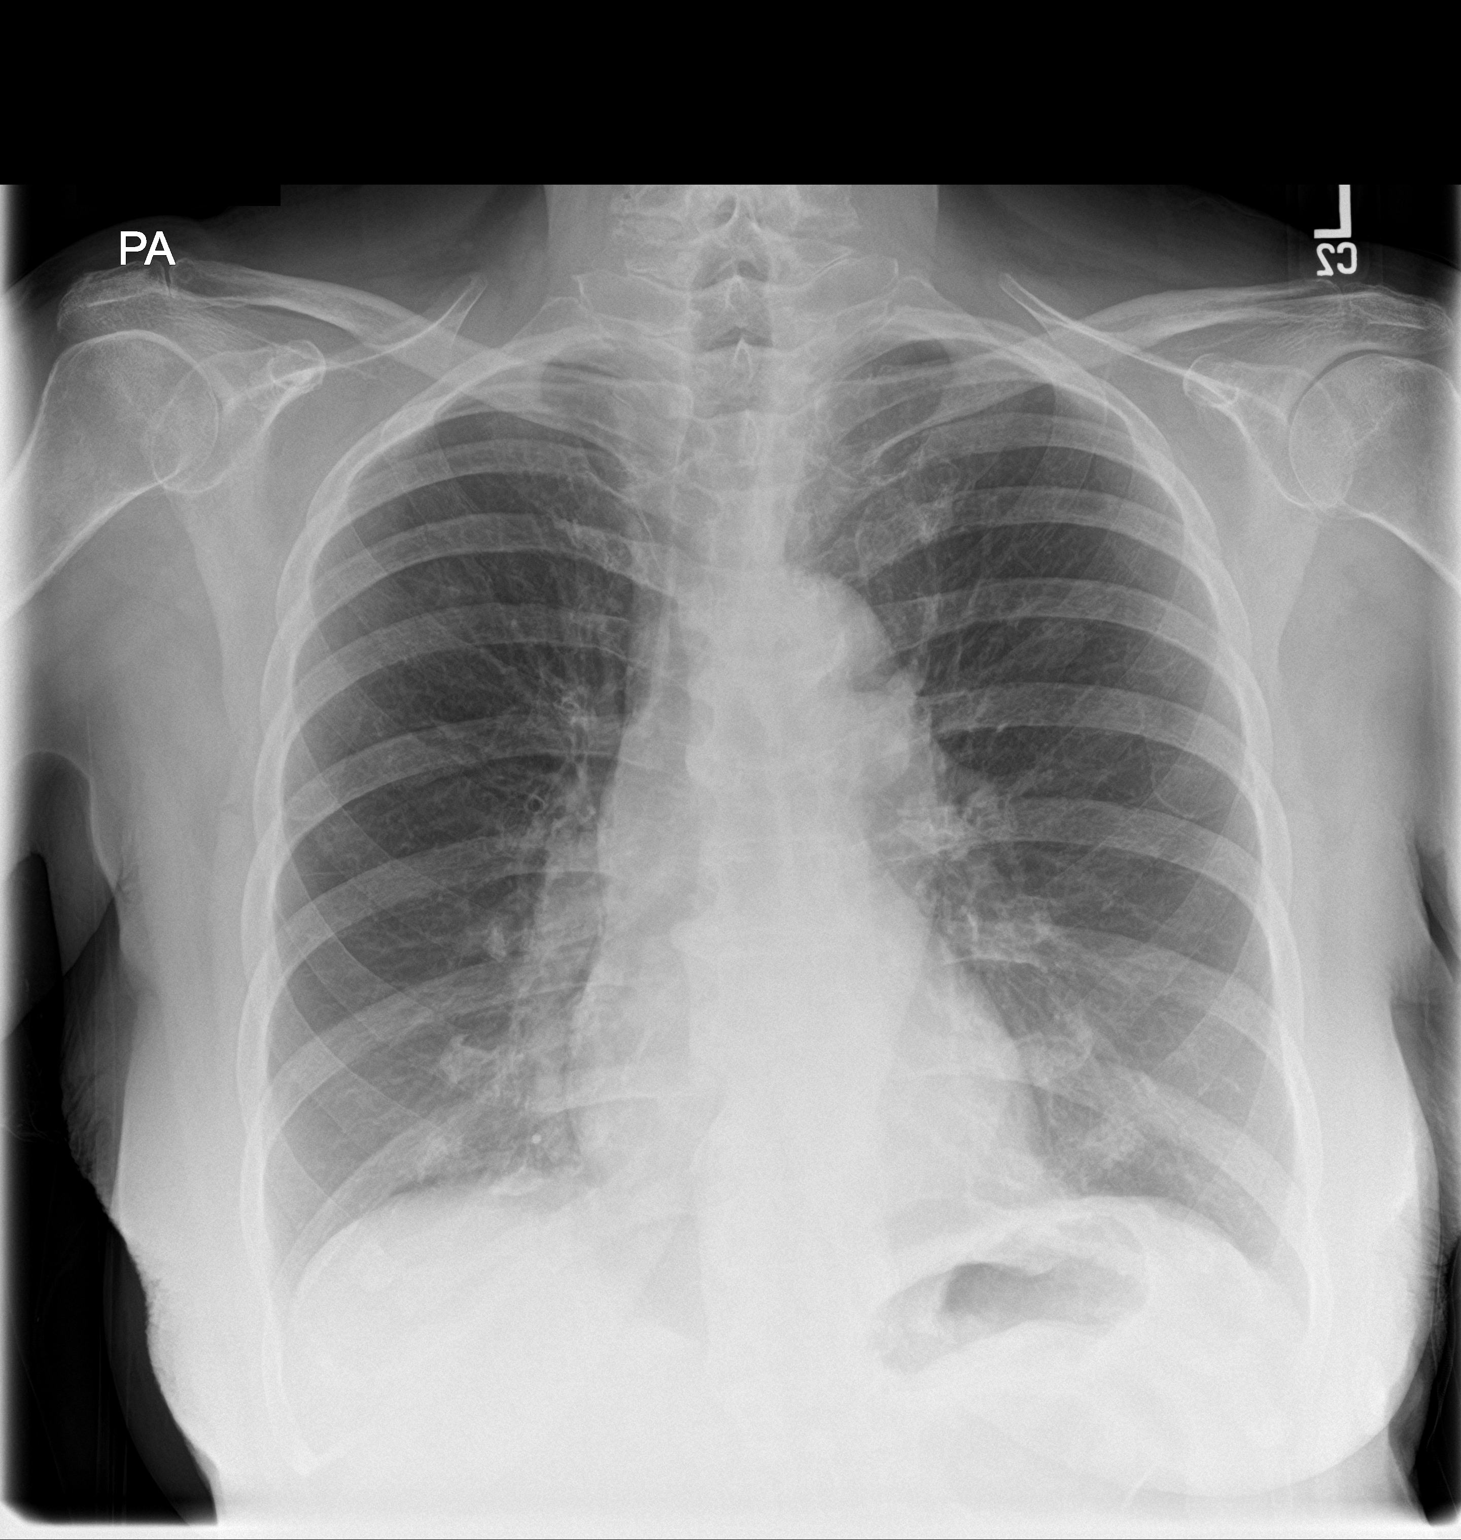

[chest lat]
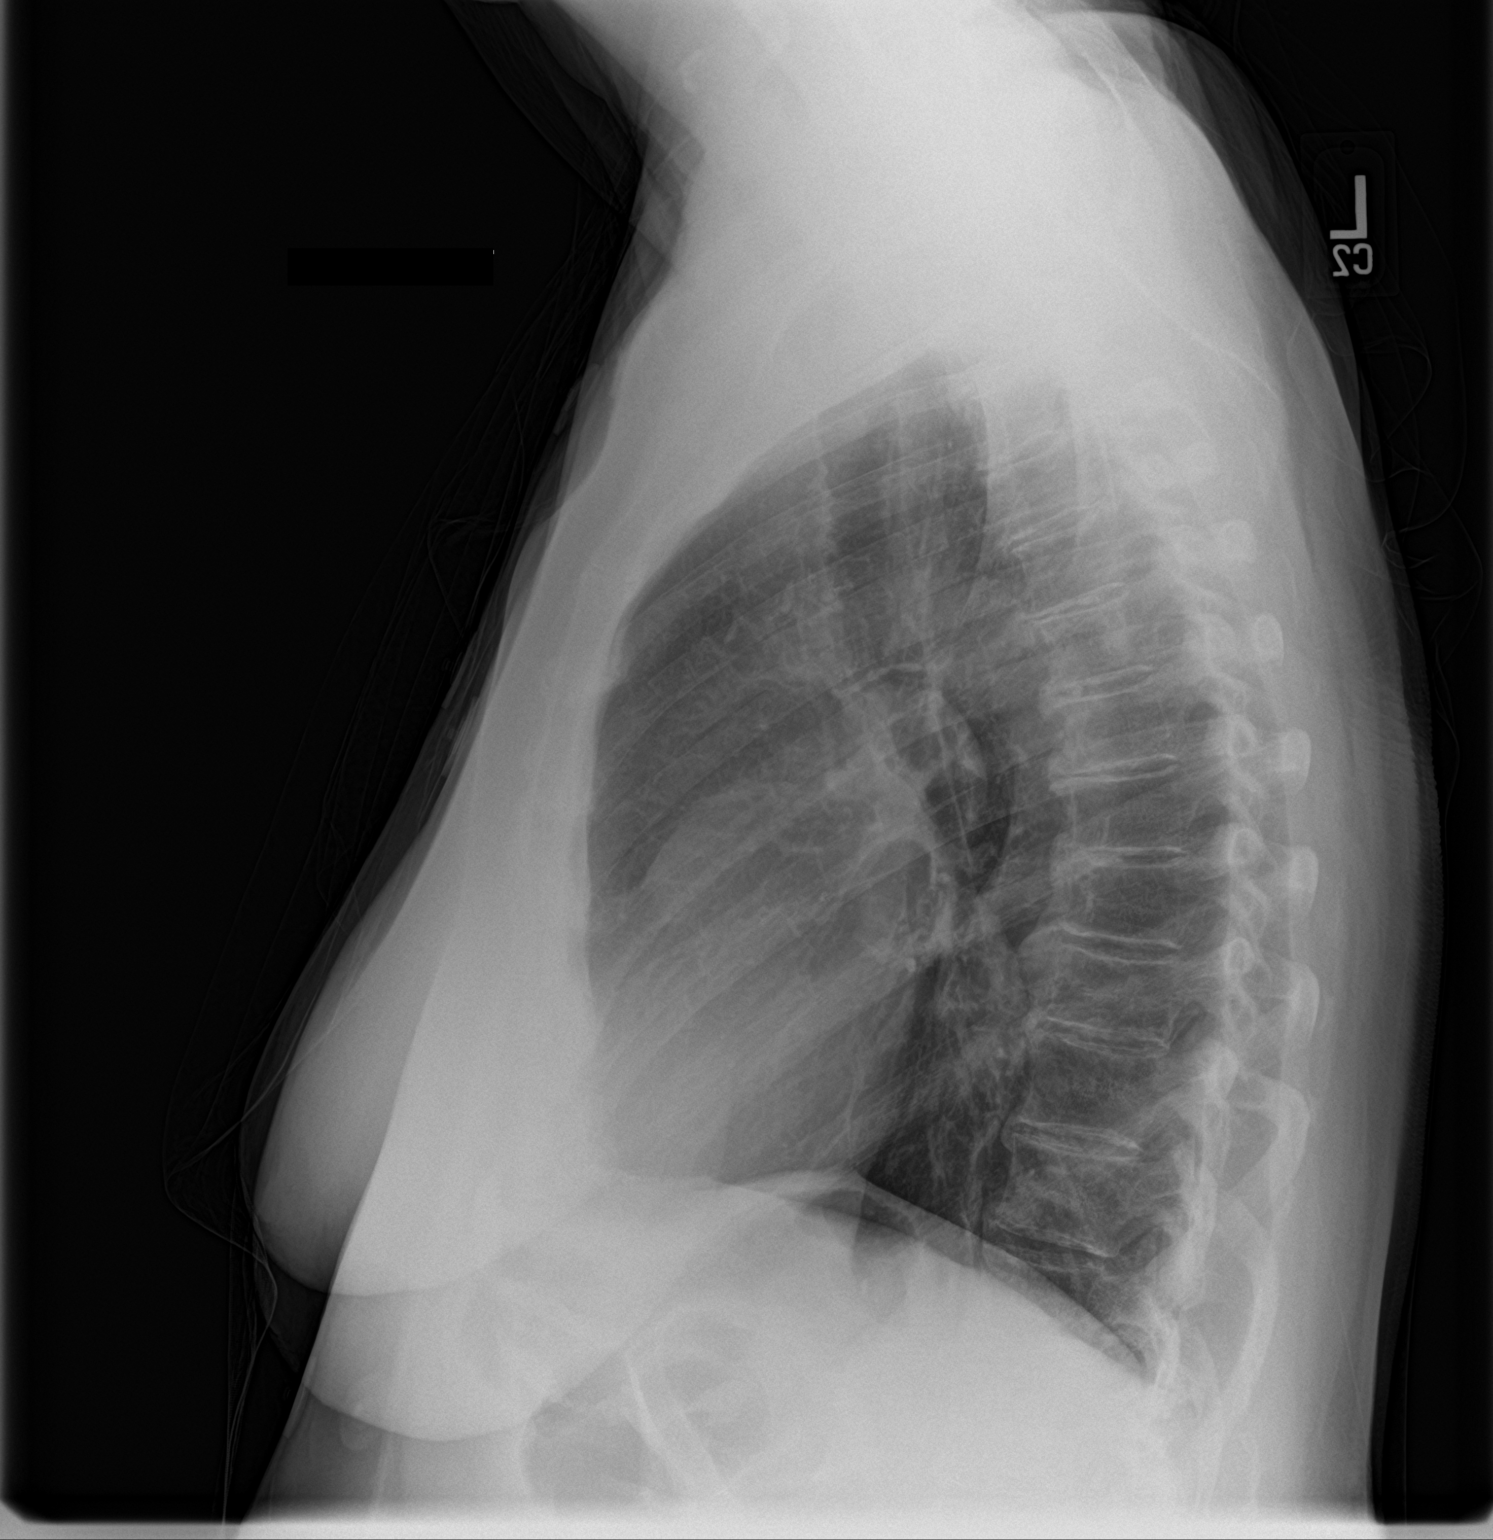

[2 of 2 positions shown; findings below may reference images not displayed]

FINDINGS: No consolidation or effusion. Hazy interstitial lung base opacities,
no change from priors and felt chronic. Stable cardiomediastinal
silhouette with aortic atherosclerosis. No pneumothorax.
IMPRESSION: No active cardiopulmonary disease. No significant change from prior
radiographs.

## 2019-11-26 DIAGNOSIS — K219 Gastro-esophageal reflux disease without esophagitis: Secondary | ICD-10-CM | POA: Diagnosis not present

## 2019-11-26 DIAGNOSIS — R7309 Other abnormal glucose: Secondary | ICD-10-CM | POA: Diagnosis not present

## 2019-11-26 DIAGNOSIS — F3177 Bipolar disorder, in partial remission, most recent episode mixed: Secondary | ICD-10-CM | POA: Diagnosis not present

## 2019-11-26 DIAGNOSIS — F3110 Bipolar disorder, current episode manic without psychotic features, unspecified: Secondary | ICD-10-CM | POA: Diagnosis not present

## 2019-11-26 DIAGNOSIS — J441 Chronic obstructive pulmonary disease with (acute) exacerbation: Secondary | ICD-10-CM | POA: Diagnosis not present

## 2019-11-26 DIAGNOSIS — E871 Hypo-osmolality and hyponatremia: Secondary | ICD-10-CM | POA: Diagnosis not present

## 2019-11-26 DIAGNOSIS — R81 Glycosuria: Secondary | ICD-10-CM | POA: Diagnosis not present

## 2019-11-26 DIAGNOSIS — I10 Essential (primary) hypertension: Secondary | ICD-10-CM | POA: Diagnosis not present

## 2019-11-26 DIAGNOSIS — R059 Cough, unspecified: Secondary | ICD-10-CM | POA: Diagnosis not present

## 2019-11-26 DIAGNOSIS — J42 Unspecified chronic bronchitis: Secondary | ICD-10-CM | POA: Diagnosis not present

## 2019-11-27 LAB — COMPLETE METABOLIC PANEL WITH GFR
AG Ratio: 1.9 (calc) (ref 1.0–2.5)
ALT: 8 U/L (ref 6–29)
AST: 12 U/L (ref 10–35)
Albumin: 3.8 g/dL (ref 3.6–5.1)
Alkaline phosphatase (APISO): 45 U/L (ref 37–153)
BUN: 15 mg/dL (ref 7–25)
CO2: 34 mmol/L — ABNORMAL HIGH (ref 20–32)
Calcium: 9.4 mg/dL (ref 8.6–10.4)
Chloride: 98 mmol/L (ref 98–110)
Creat: 0.85 mg/dL (ref 0.60–0.93)
GFR, Est African American: 76 mL/min/{1.73_m2} (ref >=60)
GFR, Est Non African American: 65 mL/min/{1.73_m2} (ref >=60)
Globulin: 2 g/dL (calc) (ref 1.9–3.7)
Glucose, Bld: 146 mg/dL — ABNORMAL HIGH (ref 65–139)
Potassium: 3.7 mmol/L (ref 3.5–5.3)
Sodium: 141 mmol/L (ref 135–146)
Total Bilirubin: 0.4 mg/dL (ref 0.2–1.2)
Total Protein: 5.8 g/dL — ABNORMAL LOW (ref 6.1–8.1)

## 2019-11-27 LAB — HEMOGLOBIN A1C
Hgb A1c MFr Bld: 6.2 % of total Hgb — ABNORMAL HIGH (ref <5.7)
Mean Plasma Glucose: 131 (calc)
eAG (mmol/L): 7.3 (calc)

## 2019-11-30 ENCOUNTER — Emergency Department (INDEPENDENT_AMBULATORY_CARE_PROVIDER_SITE_OTHER)
Admission: EM | Admit: 2019-11-30 | Discharge: 2019-11-30 | Disposition: A | Discharge status: Home / Self Care | Payer: Medicare Other | Source: Home / Self Care

## 2019-11-30 ENCOUNTER — Encounter: Payer: Self-pay | Admitting: Emergency Medicine

## 2019-11-30 ENCOUNTER — Other Ambulatory Visit: Payer: Self-pay

## 2019-11-30 DIAGNOSIS — N3001 Acute cystitis with hematuria: Secondary | ICD-10-CM | POA: Diagnosis not present

## 2019-11-30 LAB — POCT URINALYSIS DIP (MANUAL ENTRY)
Blood, UA: NEGATIVE
Glucose, UA: 250 mg/dL — AB
Nitrite, UA: POSITIVE — AB
Protein Ur, POC: >=300 mg/dL — AB
Spec Grav, UA: 1.01 (ref 1.010–1.025)
Urobilinogen, UA: >=8 E.U./dL — AB
pH, UA: 5.5 (ref 5.0–8.0)

## 2019-11-30 MED ORDER — LEVOFLOXACIN 500 MG PO TABS: 500.0000 mg | ORAL_TABLET | Freq: Every day | ORAL | 0 refills | Status: DC

## 2019-11-30 NOTE — ED Provider Notes (Signed)
[name]    CSN: [id number] Arrival date & time: 11/30/19  1359      History   Chief Complaint Chief Complaint  Patient presents with  . Dysuria    HPI Anita Krause is a 79 y.o. female.   HPI  Anita Krause is a 79 y.o. female presenting to UC with c/o dysuria that started yesterday. Symptoms have improved today after taking Azo but states she has had UTIs in the past, symptoms feel similar.  She was hospitalized last month for a COPD exacerbation.  Denies fever, chills, n/v/d. No abdominal pain or back pain at this time.   Past Medical History:  Diagnosis Date  . AAA (abdominal aortic aneurysm) without rupture ([place]) 05/03/2017   3.4 cm May 2019  . Bipolar 1 disorder ([place])   . 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  . Chronic back pain 03/14/2017  . Chronic bronchitis ([place]) 09/21/2016  . COPD (chronic obstructive pulmonary disease) ([place])   . Hypertension     Patient Active Problem List   Diagnosis Date Noted  . Influenza vaccination declined by patient 10/02/2019  . Stress incontinence due to pelvic organ prolapse 10/02/2019  . Bilateral impacted cerumen 10/02/2019  . Cough 09/18/2019  . Itching 09/18/2019  . Gastroesophageal reflux disease 09/18/2019  . Screening for hypothyroidism 09/18/2019  . Screening for cholesterol level 09/18/2019  . History of non anemic vitamin B12 deficiency 09/18/2019  . Screening for iron deficiency anemia 09/18/2019  . Screening for diabetes mellitus 09/18/2019  . Hypertension 09/18/2019  . 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  . AAA (abdominal aortic aneurysm) without rupture ([place]) 05/03/2017  . Bursitis of hip, right 05/02/2017  . Chronic back pain 03/14/2017  . 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 Abdominal aortic atherosclerosis ([place]) 03/08/2017  . Bipolar 1 disorder, mixed, partial remission ([place]) 09/21/2016  . 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 History of hypertension  09/21/2016  . Tobacco abuse disorder 09/21/2016  . Chronic bronchitis ([place]) 09/21/2016  . Functional memory problem 09/21/2016  . Muscle spasm 09/21/2016  . Anxiety 09/21/2016  . Adjustment disorder with disturbance of emotion 08/29/2016    Past Surgical History:  Procedure Laterality Date  . ABDOMINAL HYSTERECTOMY    . ANEURYSM COILING    . BACK SURGERY    . CARDIAC SURGERY    . POLYPECTOMY     VOCAL CORD    OB History   No obstetric history on file.      Home Medications    Prior to Admission medications   Medication Sig Start Date End Date Taking? Authorizing Provider  albuterol (PROVENTIL) (5 MG/ML) 0.5% nebulizer solution Take 0.5 mLs (2.5 mg total) by nebulization every 6 (six) hours as needed for wheezing or shortness of breath. 06/28/18  Yes Nanavati, Ankit, MD  albuterol (VENTOLIN HFA) 108 (90 Base) MCG/ACT inhaler Inhale 2 puffs into the lungs every 6 (six) hours as needed for wheezing or shortness of breath.   Yes [provider]  ALPRAZolam (XANAX) 0.5 MG tablet Take 1 tablet (0.5 mg total) by mouth daily as needed. REFILLS THROUGH PSYCHIATRY 11/11/19  Yes Emeterio Reeve, DO  aspirin EC 81 MG tablet Take 81 mg by mouth daily. Swallow whole.   Yes [provider]  benzonatate (TESSALON) 200 MG capsule Take 1 capsule (200 mg total) by mouth 2 (two) times daily as needed for cough. 11/11/19  Yes Emeterio Reeve, DO  Budeson-Glycopyrrol-Formoterol (BREZTRI AEROSPHERE) 160-9-4.8  MCG/ACT AERO Inhale 2 puffs into the lungs in the morning and at bedtime.    Yes [provider]  carvedilol (COREG) 6.25 MG tablet Take 6.25 mg by mouth 2 (two) times daily with a meal.  10/29/19  Yes [provider]  cetirizine (ZYRTEC) 10 MG tablet Take 10 mg by mouth daily.   Yes [provider]  Cholecalciferol 25 MCG (1000 UT) tablet Take 1,000 Units by mouth daily.   Yes [provider]  cyclobenzaprine (FLEXERIL) 5 MG tablet Take 1  tablet (5 mg total) by mouth 3 (three) times daily as needed for muscle spasms. 09/18/19  Yes Early, Coralee Pesa, NP  fluticasone (FLONASE) 50 MCG/ACT nasal spray Place 2 sprays into both nostrils daily. 10/16/19  Yes [provider]  hydrOXYzine (ATARAX/VISTARIL) 25 MG tablet Take 1 tablet (25 mg total) by mouth 2 (two) times daily as needed for anxiety. 09/18/19  Yes Early, Coralee Pesa, NP  nystatin (MYCOSTATIN) 100000 UNIT/ML suspension Take 5 mLs by mouth 4 (four) times daily. 10/16/19  Yes [provider]  omeprazole (PRILOSEC) 40 MG capsule Take 1 capsule (40 mg total) by mouth daily. 11/11/19  Yes Emeterio Reeve, DO  risperiDONE (RISPERDAL) 1 MG tablet Take 1 mg by mouth at bedtime. 10/09/19  Yes [provider]  rosuvastatin (CRESTOR) 40 MG tablet Take 40 mg by mouth daily. 10/07/19  Yes [provider]  triamterene-hydrochlorothiazide (DYAZIDE) 37.5-25 MG capsule Take 1 each (1 capsule total) by mouth daily. For high blood pressure 11/11/19  Yes Emeterio Reeve, DO  AMBULATORY NON FORMULARY MEDICATION Toilet seat riser Dx ambulatory dysfunction, COPD 11/11/19   Emeterio Reeve, DO  AMBULATORY NON FORMULARY MEDICATION Walker Dx ambulatory dysfunction, COPD 11/11/19   Emeterio Reeve, DO  AMBULATORY NON [place] MEDICATION Shower chair dx ambulatory dysfunction, COPD 11/11/19   Emeterio Reeve, DO  clopidogrel (PLAVIX) 75 MG tablet TAKE 1 TABLET(75 MG) BY MOUTH DAILY Patient not taking: Reported on 11/30/2019 11/06/19   [provider]  levofloxacin (LEVAQUIN) 500 MG tablet Take 1 tablet (500 mg total) by mouth daily. 11/30/19   Noe Gens, PA-C  Misc. Devices MISC Chest vest - Smart Vest for respiratory clearance.  Severe COPD. 10/29/19   [provider]  montelukast (SINGULAIR) 10 MG tablet Take 10 mg by mouth at bedtime. Patient not taking: Reported on 11/30/2019    [provider]  ondansetron (ZOFRAN ODT) 4 MG disintegrating  tablet Take one tab by mouth Q6hr prn nausea.  Dissolve under tongue. 11/11/19   Emeterio Reeve, DO  predniSONE (DELTASONE) 10 MG tablet Take 4 tablets for 3 days, Take 3 tablets for 3 days, Take 2 tablets for 3 days, Take 1 tablet for 3 days Patient not taking: Reported on 11/30/2019 11/07/19   [provider]  SYMBICORT 80-4.5 MCG/ACT inhaler Inhale into the lungs. Patient not taking: Reported on 11/30/2019 11/30/19   [provider]    Family History Family History  Problem Relation Age of Onset  . Hyperlipidemia Mother   . Hypertension Mother   . Diabetes Mother   . Heart failure Mother   . Hyperlipidemia Father   . Hypertension Father   . Heart failure Father   . Cancer Sister   . Healthy Brother   . Healthy Brother     Social History Social History   Tobacco Use  . Smoking status: Former Smoker    Packs/day: 0.25    Types: Cigarettes  . Smokeless tobacco: Never Used  .  Tobacco comment: 09/10/17  Vaping Use  . Vaping Use: Never used  Substance Use Topics  . Alcohol use: No  . Drug use: No     Allergies   Erythromycin, Nitrofurantoin macrocrystal, Penicillins, Colesevelam hcl, Doxycycline, and Lamotrigine   Review of Systems Review of Systems  Constitutional: Negative for chills and fever.  Gastrointestinal: Negative for abdominal pain, diarrhea, nausea and vomiting.  Genitourinary: Positive for decreased urine volume (yesterday, improved today after Azo) and dysuria. Negative for flank pain, frequency, hematuria, pelvic pain and urgency.     Physical Exam Triage Vital Signs ED Triage Vitals  Enc Vitals Group     BP 11/30/19 1419 120/76     Pulse Rate 11/30/19 1419 91     Resp 11/30/19 1419 18     Temp 11/30/19 1419 99 F (37.2 C)     Temp Source 11/30/19 1419 Oral     SpO2 11/30/19 1419 96 %     Weight 11/30/19 1421 160 lb (72.6 kg)     Height 11/30/19 1421 5\' 4"  (1.626 m)     Head Circumference --      Peak Flow --      Pain  Score 11/30/19 1420 2     Pain Loc --      Pain Edu? --      Excl. in [place]? --    No data found.  Updated Vital Signs BP 120/76 (BP Location: Right Arm)   Pulse 85   Temp 99 F (37.2 C) (Oral)   Resp 18   Ht 5\' 4"  (1.626 m)   Wt 160 lb (72.6 kg)   SpO2 96%   BMI 27.46 kg/m   Visual Acuity Right Eye Distance:   Left Eye Distance:   Bilateral Distance:    Right Eye Near:   Left Eye Near:    Bilateral Near:     Physical Exam Vitals and nursing note reviewed.  Constitutional:      General: She is not in acute distress.    Appearance: Normal appearance. She is well-developed. She is not ill-appearing, toxic-appearing or diaphoretic.  HENT:     Head: Normocephalic and atraumatic.  Cardiovascular:     Rate and Rhythm: Normal rate and regular rhythm.  Pulmonary:     Effort: Pulmonary effort is normal. No respiratory distress.     Breath sounds: Wheezing and rhonchi present. No rales.     Comments: Diffuse wheeze and rhonchi Abdominal:     General: There is no distension.     Palpations: Abdomen is soft.     Tenderness: There is no abdominal tenderness. There is no right CVA tenderness or left CVA tenderness.  Musculoskeletal:        General: Normal range of motion.     Cervical back: Normal range of motion.  Skin:    General: Skin is warm and dry.  Neurological:     Mental Status: She is alert and oriented to person, place, and time.  Psychiatric:        Behavior: Behavior normal.      UC Treatments / Results  Labs (all labs ordered are listed, but only abnormal results are displayed) Labs Reviewed  POCT URINALYSIS DIP (MANUAL ENTRY) - Abnormal; Notable for the following components:      Result Value   Color, UA red (*)    Glucose, UA =250 (*)    Bilirubin, UA moderate (*)    Ketones, POC UA small (15) (*)    Protein  Ur, POC >=300 (*)    Urobilinogen, UA >=8.0 (*)    Nitrite, UA Positive (*)    Leukocytes, UA Large (3+) (*)    All other components within  normal limits  URINE CULTURE    EKG   Radiology No results found.  Procedures Procedures (including critical care time)  Medications Ordered in UC Medications - No data to display  Initial Impression / Assessment and Plan / UC Course  I have reviewed the triage vital signs and the nursing notes.  Pertinent labs & imaging results that were available during my care of the patient were reviewed by me and considered in my medical decision making (see chart for details).     UA unreliable due to recent use of Azo Culture sent Due to prior hx of UTIs, will start empirically on levoquin, pt states that is all she can take for her UTIs F/u with PCP as needed AVS given  Final Clinical Impressions(s) / UC Diagnoses   Final diagnoses:  Acute cystitis with hematuria     Discharge Instructions      Please take your antibiotic as prescribed. A urine culture has been sent to check the severity of your urinary infection and to determine if you are on the most appropriate antibiotic. The results should come back within 2-3 days. You will only be notified if a medication change is indicated.  Please follow up with family medicine or urology if not improving within 1 week, sooner if symptoms worsening.      ED Prescriptions    Medication Sig Dispense Auth. Provider   levofloxacin (LEVAQUIN) 500 MG tablet Take 1 tablet (500 mg total) by mouth daily. 7 tablet Noe Gens, PA-C     PDMP not reviewed this encounter.   Noe Gens, [place] 11/30/19 1629

## 2019-11-30 NOTE — ED Triage Notes (Signed)
Dysuria since yesterday  Took AZO today - urine is reddish orange

## 2019-11-30 NOTE — Discharge Instructions (Signed)
  Please take your antibiotic as prescribed. A urine culture has been sent to check the severity of your urinary infection and to determine if you are on the most appropriate antibiotic. The results should come back within 2-3 days. You will only be notified if a medication change is indicated.  Please follow up with family medicine or urology if not improving within 1 week, sooner if symptoms worsening.

## 2019-11-30 NOTE — ED Notes (Signed)
Pt discharged in W/C to car - pt to go straight home and go on home O2 - pt remained on 1 LO2 via [place] until transfer into car by RN. Pt denied dizziness or SOB at discharge.

## 2019-12-02 LAB — URINE CULTURE
MICRO NUMBER:: 11250821
SPECIMEN QUALITY:: ADEQUATE

## 2019-12-08 DIAGNOSIS — I2510 Atherosclerotic heart disease of native coronary artery without angina pectoris: Secondary | ICD-10-CM | POA: Diagnosis not present

## 2019-12-08 DIAGNOSIS — Z955 Presence of coronary angioplasty implant and graft: Secondary | ICD-10-CM | POA: Diagnosis not present

## 2019-12-08 DIAGNOSIS — I10 Essential (primary) hypertension: Secondary | ICD-10-CM | POA: Diagnosis not present

## 2019-12-09 DIAGNOSIS — F319 Bipolar disorder, unspecified: Secondary | ICD-10-CM | POA: Diagnosis not present

## 2019-12-09 DIAGNOSIS — E785 Hyperlipidemia, unspecified: Secondary | ICD-10-CM | POA: Diagnosis not present

## 2019-12-09 DIAGNOSIS — R0602 Shortness of breath: Secondary | ICD-10-CM | POA: Diagnosis not present

## 2019-12-09 DIAGNOSIS — F5101 Primary insomnia: Secondary | ICD-10-CM | POA: Diagnosis not present

## 2019-12-09 DIAGNOSIS — Z7951 Long term (current) use of inhaled steroids: Secondary | ICD-10-CM | POA: Diagnosis not present

## 2019-12-09 DIAGNOSIS — R600 Localized edema: Secondary | ICD-10-CM | POA: Diagnosis not present

## 2019-12-09 DIAGNOSIS — J449 Chronic obstructive pulmonary disease, unspecified: Secondary | ICD-10-CM | POA: Diagnosis not present

## 2019-12-09 DIAGNOSIS — J9622 Acute and chronic respiratory failure with hypercapnia: Secondary | ICD-10-CM | POA: Diagnosis not present

## 2019-12-09 DIAGNOSIS — Z90710 Acquired absence of both cervix and uterus: Secondary | ICD-10-CM | POA: Diagnosis not present

## 2019-12-09 DIAGNOSIS — C3411 Malignant neoplasm of upper lobe, right bronchus or lung: Secondary | ICD-10-CM | POA: Diagnosis not present

## 2019-12-09 DIAGNOSIS — R0689 Other abnormalities of breathing: Secondary | ICD-10-CM | POA: Diagnosis not present

## 2019-12-09 DIAGNOSIS — Z881 Allergy status to other antibiotic agents status: Secondary | ICD-10-CM | POA: Diagnosis not present

## 2019-12-09 DIAGNOSIS — I2510 Atherosclerotic heart disease of native coronary artery without angina pectoris: Secondary | ICD-10-CM | POA: Diagnosis not present

## 2019-12-09 DIAGNOSIS — Z806 Family history of leukemia: Secondary | ICD-10-CM | POA: Diagnosis not present

## 2019-12-09 DIAGNOSIS — R918 Other nonspecific abnormal finding of lung field: Secondary | ICD-10-CM | POA: Diagnosis not present

## 2019-12-09 DIAGNOSIS — F17210 Nicotine dependence, cigarettes, uncomplicated: Secondary | ICD-10-CM | POA: Diagnosis not present

## 2019-12-09 DIAGNOSIS — Z20822 Contact with and (suspected) exposure to covid-19: Secondary | ICD-10-CM | POA: Diagnosis not present

## 2019-12-09 DIAGNOSIS — Z8249 Family history of ischemic heart disease and other diseases of the circulatory system: Secondary | ICD-10-CM | POA: Diagnosis not present

## 2019-12-09 DIAGNOSIS — R069 Unspecified abnormalities of breathing: Secondary | ICD-10-CM | POA: Diagnosis not present

## 2019-12-09 DIAGNOSIS — Z79899 Other long term (current) drug therapy: Secondary | ICD-10-CM | POA: Diagnosis not present

## 2019-12-09 DIAGNOSIS — R9431 Abnormal electrocardiogram [ECG] [EKG]: Secondary | ICD-10-CM | POA: Diagnosis not present

## 2019-12-09 DIAGNOSIS — Z880 Allergy status to penicillin: Secondary | ICD-10-CM | POA: Diagnosis not present

## 2019-12-09 DIAGNOSIS — R0902 Hypoxemia: Secondary | ICD-10-CM | POA: Diagnosis not present

## 2019-12-09 DIAGNOSIS — G9340 Encephalopathy, unspecified: Secondary | ICD-10-CM | POA: Diagnosis not present

## 2019-12-09 DIAGNOSIS — F17200 Nicotine dependence, unspecified, uncomplicated: Secondary | ICD-10-CM | POA: Diagnosis not present

## 2019-12-09 DIAGNOSIS — J189 Pneumonia, unspecified organism: Secondary | ICD-10-CM | POA: Diagnosis not present

## 2019-12-09 DIAGNOSIS — Z8049 Family history of malignant neoplasm of other genital organs: Secondary | ICD-10-CM | POA: Diagnosis not present

## 2019-12-09 DIAGNOSIS — R062 Wheezing: Secondary | ICD-10-CM | POA: Diagnosis not present

## 2019-12-09 DIAGNOSIS — I10 Essential (primary) hypertension: Secondary | ICD-10-CM | POA: Diagnosis not present

## 2019-12-09 DIAGNOSIS — Z955 Presence of coronary angioplasty implant and graft: Secondary | ICD-10-CM | POA: Diagnosis not present

## 2019-12-09 DIAGNOSIS — Z7982 Long term (current) use of aspirin: Secondary | ICD-10-CM | POA: Diagnosis not present

## 2019-12-09 DIAGNOSIS — R059 Cough, unspecified: Secondary | ICD-10-CM | POA: Diagnosis not present

## 2019-12-09 DIAGNOSIS — Z9981 Dependence on supplemental oxygen: Secondary | ICD-10-CM | POA: Diagnosis not present

## 2019-12-09 DIAGNOSIS — Z888 Allergy status to other drugs, medicaments and biological substances status: Secondary | ICD-10-CM | POA: Diagnosis not present

## 2019-12-09 DIAGNOSIS — I700 Atherosclerosis of aorta: Secondary | ICD-10-CM | POA: Diagnosis not present

## 2019-12-10 DIAGNOSIS — J9622 Acute and chronic respiratory failure with hypercapnia: Secondary | ICD-10-CM | POA: Diagnosis not present

## 2019-12-10 DIAGNOSIS — F17200 Nicotine dependence, unspecified, uncomplicated: Secondary | ICD-10-CM | POA: Diagnosis not present

## 2019-12-10 DIAGNOSIS — G9340 Encephalopathy, unspecified: Secondary | ICD-10-CM | POA: Diagnosis not present

## 2019-12-10 DIAGNOSIS — I10 Essential (primary) hypertension: Secondary | ICD-10-CM | POA: Diagnosis not present

## 2019-12-10 DIAGNOSIS — F319 Bipolar disorder, unspecified: Secondary | ICD-10-CM | POA: Diagnosis not present

## 2019-12-10 DIAGNOSIS — F5101 Primary insomnia: Secondary | ICD-10-CM | POA: Diagnosis not present

## 2019-12-10 DIAGNOSIS — C3411 Malignant neoplasm of upper lobe, right bronchus or lung: Secondary | ICD-10-CM | POA: Diagnosis not present

## 2019-12-11 DIAGNOSIS — C3411 Malignant neoplasm of upper lobe, right bronchus or lung: Secondary | ICD-10-CM | POA: Diagnosis not present

## 2019-12-11 DIAGNOSIS — F319 Bipolar disorder, unspecified: Secondary | ICD-10-CM | POA: Diagnosis not present

## 2019-12-11 DIAGNOSIS — F5101 Primary insomnia: Secondary | ICD-10-CM | POA: Diagnosis not present

## 2019-12-11 DIAGNOSIS — R059 Cough, unspecified: Secondary | ICD-10-CM | POA: Diagnosis not present

## 2019-12-11 DIAGNOSIS — I10 Essential (primary) hypertension: Secondary | ICD-10-CM | POA: Diagnosis not present

## 2019-12-11 DIAGNOSIS — J9622 Acute and chronic respiratory failure with hypercapnia: Secondary | ICD-10-CM | POA: Diagnosis not present

## 2019-12-11 DIAGNOSIS — G9340 Encephalopathy, unspecified: Secondary | ICD-10-CM | POA: Diagnosis not present

## 2019-12-11 DIAGNOSIS — F17200 Nicotine dependence, unspecified, uncomplicated: Secondary | ICD-10-CM | POA: Diagnosis not present

## 2019-12-12 DIAGNOSIS — K219 Gastro-esophageal reflux disease without esophagitis: Secondary | ICD-10-CM | POA: Diagnosis not present

## 2019-12-12 DIAGNOSIS — I10 Essential (primary) hypertension: Secondary | ICD-10-CM | POA: Diagnosis not present

## 2019-12-12 DIAGNOSIS — F3110 Bipolar disorder, current episode manic without psychotic features, unspecified: Secondary | ICD-10-CM | POA: Diagnosis not present

## 2019-12-12 DIAGNOSIS — E871 Hypo-osmolality and hyponatremia: Secondary | ICD-10-CM | POA: Diagnosis not present

## 2019-12-12 DIAGNOSIS — J441 Chronic obstructive pulmonary disease with (acute) exacerbation: Secondary | ICD-10-CM | POA: Diagnosis not present

## 2019-12-13 DIAGNOSIS — J9622 Acute and chronic respiratory failure with hypercapnia: Secondary | ICD-10-CM | POA: Diagnosis not present

## 2019-12-15 ENCOUNTER — Telehealth: Payer: Self-pay

## 2019-12-15 NOTE — Telephone Encounter (Signed)
Anita Krause called and left a message stating she still have a UTI. I called and left a message stating she will need an appointment.

## 2019-12-16 ENCOUNTER — Encounter: Payer: Self-pay | Admitting: Medical-Surgical

## 2019-12-16 ENCOUNTER — Ambulatory Visit (INDEPENDENT_AMBULATORY_CARE_PROVIDER_SITE_OTHER): Payer: Medicare Other | Admitting: Medical-Surgical

## 2019-12-16 ENCOUNTER — Other Ambulatory Visit: Payer: Self-pay

## 2019-12-16 VITALS — BP 101/60 | HR 83 | Temp 98.5°F | Ht 64.0 in | Wt 164.1 lb

## 2019-12-16 DIAGNOSIS — R600 Localized edema: Secondary | ICD-10-CM | POA: Diagnosis not present

## 2019-12-16 DIAGNOSIS — R300 Dysuria: Secondary | ICD-10-CM | POA: Diagnosis not present

## 2019-12-16 LAB — POCT URINALYSIS DIP (CLINITEK)
Bilirubin, UA: NEGATIVE
Blood, UA: NEGATIVE
Glucose, UA: NEGATIVE mg/dL
Ketones, POC UA: NEGATIVE mg/dL
Leukocytes, UA: NEGATIVE
Nitrite, UA: NEGATIVE
POC PROTEIN,UA: NEGATIVE
Spec Grav, UA: 1.015 (ref 1.010–1.025)
Urobilinogen, UA: 0.2 E.U./dL
pH, UA: 6.5 (ref 5.0–8.0)

## 2019-12-16 NOTE — Progress Notes (Signed)
Subjective:    CC: UTI, leg swelling  HPI: Pleasant 78 year old female accompanied by her sister presenting today for a possible UTI as well as bilateral lower extremity edema.  UTI-no dysuria symptoms today but notes that she was recently treated for a UTI and wanted to make sure her urine was clear.  Bilateral lower extremity edema-was hospitalized for COPD exacerbation with hypoxia on 12/7.  Edema started approximately 1 day ago.  Affects bilateral lower extremities including feet, ankles, and lower legs.  Avoids adding dietary sodium and eating processed foods.  Does sit with her feet and legs dependent most often.  Notes that her blood pressure medications were changed while in the hospital.  Notes they stopped her triamterene-HCTZ and started her on amlodipine 5 mg daily.  Since her swelling started yesterday, she took a dose of furosemide 20 mg that belonged to a friend who has passed away.  I reviewed the past medical history, family history, social history, surgical history, and allergies today and no changes were needed.  Please see the problem list section below in epic for further details.  Past Medical History: Past Medical History:  Diagnosis Date  . AAA (abdominal aortic aneurysm) without rupture ([place]) 05/03/2017   3.4 cm May 2019  . Bipolar 1 disorder ([place])   . 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  . Chronic back pain 03/14/2017  . Chronic bronchitis ([place]) 09/21/2016  . COPD (chronic obstructive pulmonary disease) ([place])   . Hypertension    Past Surgical History: Past Surgical History:  Procedure Laterality Date  . ABDOMINAL HYSTERECTOMY    . ANEURYSM COILING    . BACK SURGERY    . CARDIAC SURGERY    . POLYPECTOMY     VOCAL CORD   Social History: Social History   Socioeconomic History  . Marital status: Divorced    Spouse name: Not on file  . Number of children: Not on file  . Years of education: Not on file  . Highest education level: Not on file  Occupational History  .  Not on file  Tobacco Use  . Smoking status: Former Smoker    Packs/day: 0.25    Types: Cigarettes  . Smokeless tobacco: Never Used  . Tobacco comment: 09/10/17  Vaping Use  . Vaping Use: Never used  Substance and Sexual Activity  . Alcohol use: No  . Drug use: No  . Sexual activity: Not Currently  Other Topics Concern  . Not on file  Social History Narrative  . Not on file   Social Determinants of Health   Financial Resource Strain: Not on file  Food Insecurity: Not on file  Transportation Needs: Not on file  Physical Activity: Not on file  Stress: Not on file  Social Connections: Not on file   Family History: Family History  Problem Relation Age of Onset  . Hyperlipidemia Mother   . Hypertension Mother   . Diabetes Mother   . Heart failure Mother   . Hyperlipidemia Father   . Hypertension Father   . Heart failure Father   . Cancer Sister   . Healthy Brother   . Healthy Brother    Allergies: Allergies  Allergen Reactions  . Erythromycin Anaphylaxis    Throat closes up, ling in mouth peals  . Nitrofurantoin Macrocrystal Anaphylaxis    Pt states her throat closes up  . Penicillins Anaphylaxis  . Colesevelam Hcl Other (See Comments)  . Doxycycline Rash  . Lamotrigine Rash   Medications: See med rec.  Review  of Systems: See HPI for pertinent positives and negatives.   Objective:    General: Well Developed, well nourished, and in no acute distress.  Neuro: Alert and oriented x3.  HEENT: Normocephalic, atraumatic.  Skin: Warm and dry. Cardiac: Regular rate and rhythm, no murmurs rubs or gallops, 4+ bilateral lower extremity edema, mostly at the ankle level but some extending into the feet in the lower legs.  Respiratory: Clear to auscultation bilaterally. Not using accessory muscles, speaking in full sentences.   Impression and Recommendations:    1. Dysuria Previous UTI has completely cleared with a negative POCT UA.  No further treatment required. -  POCT URINALYSIS DIP (CLINITEK)  2.  Bilateral lower extremity edema Strongly suspect amlodipine is the cause for her sudden lower extremity edema.  On review of hospital notes, unsure why her triamterene-HCTZ was stopped.  For now, advised patient to resume taking triamterene-HCTZ and discontinue amlodipine.  Advised to closely monitor blood pressure at home.  Discontinue use of her friend's furosemide.  Recommend elevation of bilateral lower extremities and use of compression stockings.  Patient reports she cannot wear compression stockings.  In lieu of that, recommend obtaining a couple of Ace wraps and wrapping from the toe up to just below the knee bilaterally.  Avoid dietary sodium.  Strongly recommend follow-up with PCP for hospital follow-up as well as full medication reconciliation as her current medication list has a number of inconsistencies with that found in care everywhere from the hospital.  Return for hospital follow up/medication reconcilliation on Monday. ___________________________________________ Clearnce Sorrel, DNP, APRN, FNP-BC [facility] [place]

## 2019-12-18 DIAGNOSIS — J449 Chronic obstructive pulmonary disease, unspecified: Secondary | ICD-10-CM | POA: Diagnosis not present

## 2019-12-22 ENCOUNTER — Ambulatory Visit (INDEPENDENT_AMBULATORY_CARE_PROVIDER_SITE_OTHER): Payer: Medicare Other | Admitting: Osteopathic Medicine

## 2019-12-22 ENCOUNTER — Other Ambulatory Visit: Payer: Self-pay

## 2019-12-22 ENCOUNTER — Encounter: Payer: Self-pay | Admitting: Osteopathic Medicine

## 2019-12-22 VITALS — BP 118/69 | HR 99 | Temp 98.1°F | Wt 163.6 lb

## 2019-12-22 DIAGNOSIS — I10 Essential (primary) hypertension: Secondary | ICD-10-CM

## 2019-12-22 DIAGNOSIS — F3177 Bipolar disorder, in partial remission, most recent episode mixed: Secondary | ICD-10-CM

## 2019-12-22 DIAGNOSIS — R059 Cough, unspecified: Secondary | ICD-10-CM

## 2019-12-22 DIAGNOSIS — K219 Gastro-esophageal reflux disease without esophagitis: Secondary | ICD-10-CM

## 2019-12-22 DIAGNOSIS — Z23 Encounter for immunization: Secondary | ICD-10-CM | POA: Diagnosis not present

## 2019-12-22 DIAGNOSIS — J441 Chronic obstructive pulmonary disease with (acute) exacerbation: Secondary | ICD-10-CM

## 2019-12-22 MED ORDER — BENZONATATE 200 MG PO CAPS: 200.0000 mg | ORAL_CAPSULE | Freq: Three times a day (TID) | ORAL | 11 refills | Status: DC | PRN

## 2019-12-22 MED ORDER — CETIRIZINE HCL 10 MG PO TABS
10.0000 mg | ORAL_TABLET | Freq: Every day | ORAL | 3 refills | Status: DC
Start: 2019-12-22 — End: 2022-05-31

## 2019-12-22 NOTE — Progress Notes (Signed)
Anita Krause is a 79 y.o. female who presents to  [place] at [facility]  today, 12/22/19, seeking care for the following:  . Follow-up from hospitalization for COPD exacerbation - hospitalization records reviewed. Pt is accompanied by her sister, her daughter unfortunately has passed away since our last visit, circumstances unknown to me and I did not pry on this issue.      ASSESSMENT & PLAN with other pertinent findings:  The primary encounter diagnosis was COPD exacerbation ([place]). Diagnoses of Cough, Bipolar 1 disorder, mixed, partial remission ([place]), Gastroesophageal reflux disease, unspecified whether esophagitis present, and Essential hypertension were also pertinent to this visit.   Continue follow-up with specialists managing her pulmonary, cardiac and psychiatric illnesses.   No results found for this or any previous visit (from the past 24 hour(s)).  There are no Patient Instructions on file for this visit.  No orders of the defined types were placed in this encounter.   Meds ordered this encounter  Medications  . benzonatate (TESSALON) 200 MG capsule    Sig: Take 1 capsule (200 mg total) by mouth 3 (three) times daily as needed for cough.    Dispense:  90 capsule    Refill:  11  . cetirizine (ZYRTEC) 10 MG tablet    Sig: Take 1 tablet (10 mg total) by mouth daily.    Dispense:  90 tablet    Refill:  3       Follow-up instructions: Return in about 6 months (around 06/21/2020) for ANNUAL CHECK-UP - SEE [place] SOONER IF NEEDED.                                         BP 118/69   Pulse 99   Temp 98.1 F (36.7 C)   Wt 163 lb 9.6 oz (74.2 kg)   SpO2 (!) 88%   BMI 28.08 kg/m   Current Meds  Medication Sig  . albuterol (PROVENTIL) (5 MG/ML) 0.5% nebulizer solution Take 0.5 mLs (2.5 mg total) by nebulization every 6 (six) hours as needed for wheezing or shortness of breath.  Marland Kitchen  albuterol (VENTOLIN HFA) 108 (90 Base) MCG/ACT inhaler Inhale 2 puffs into the lungs every 6 (six) hours as needed for wheezing or shortness of breath.  . ALPRAZolam (XANAX) 0.25 MG tablet Take 0.25 mg by mouth daily as needed.  Marland Kitchen aspirin EC 81 MG tablet Take 81 mg by mouth daily. Swallow whole.  . carvedilol (COREG) 6.25 MG tablet Take 6.25 mg by mouth 2 (two) times daily with a meal.  . Cholecalciferol 25 MCG (1000 UT) tablet Take 1,000 Units by mouth daily.  . cyclobenzaprine (FLEXERIL) 5 MG tablet Take 1 tablet (5 mg total) by mouth 3 (three) times daily as needed for muscle spasms.  . hydrOXYzine (ATARAX/VISTARIL) 25 MG tablet Take 1 tablet (25 mg total) by mouth 2 (two) times daily as needed for anxiety.  . montelukast (SINGULAIR) 10 MG tablet Take 10 mg by mouth at bedtime.  Marland Kitchen omeprazole (PRILOSEC) 40 MG capsule Take 1 capsule (40 mg total) by mouth daily.  Marland Kitchen POTASSIUM PO Take by mouth.  . risperiDONE (RISPERDAL) 1 MG tablet Take 1 mg by mouth at bedtime.  . rosuvastatin (CRESTOR) 40 MG tablet Take 40 mg by mouth daily.  . SYMBICORT 80-4.5 MCG/ACT inhaler Inhale into the lungs.  . triamterene-hydrochlorothiazide (DYAZIDE) 37.5-25  MG capsule Take 1 each (1 capsule total) by mouth daily. For high blood pressure  . [DISCONTINUED] AMBULATORY NON FORMULARY MEDICATION Toilet seat riser Dx ambulatory dysfunction, COPD  . [DISCONTINUED] AMBULATORY NON FORMULARY MEDICATION Walker Dx ambulatory dysfunction, COPD  . [DISCONTINUED] AMBULATORY NON FORMULARY MEDICATION Shower chair dx ambulatory dysfunction, COPD  . [DISCONTINUED] benzonatate (TESSALON) 200 MG capsule Take 1 capsule (200 mg total) by mouth 2 (two) times daily as needed for cough.  . [DISCONTINUED] cetirizine (ZYRTEC) 10 MG tablet Take 10 mg by mouth daily.    No results found for this or any previous visit (from the past 72 hour(s)).  No results found.     All questions at time of visit were answered - patient instructed to  contact office with any additional concerns or updates.  ER/RTC precautions were reviewed with the patient as applicable.   Please note: voice recognition software was used to produce this document, and typos may escape review. Please contact Dr. Sheppard Coil for any needed clarifications.   Total encounter time: 40 minutes. Spent counseling and coordinating care, performing med rec

## 2020-01-09 ENCOUNTER — Telehealth: Payer: Self-pay | Admitting: Osteopathic Medicine

## 2020-01-09 NOTE — Telephone Encounter (Signed)
Pt has received a bill for labs done on 08-29-19. She said it should have been paid. [facility] is not helping her resolve the issue. She said they're putting it into collection. Please call the patient because she refused to call us back. She is very upset.

## 2020-01-15 ENCOUNTER — Telehealth: Payer: Self-pay

## 2020-01-15 DIAGNOSIS — J9611 Chronic respiratory failure with hypoxia: Secondary | ICD-10-CM | POA: Diagnosis not present

## 2020-01-15 DIAGNOSIS — F17200 Nicotine dependence, unspecified, uncomplicated: Secondary | ICD-10-CM | POA: Diagnosis not present

## 2020-01-15 DIAGNOSIS — J441 Chronic obstructive pulmonary disease with (acute) exacerbation: Secondary | ICD-10-CM | POA: Diagnosis not present

## 2020-01-15 NOTE — Telephone Encounter (Signed)
Leda Gauze from [facility] called requesting an extension of physical therapy for the patient. She mentioned that she has tried numerous to contact the pulmonologist for oxygen titration. Per Dr. Redgie Grayer recommendation oxygen tank should be between 2.5 - 3 liters. Pt is using her tank consistently at 3 liters. Nurse instructed of maximum amount. She will attempt to reach pulmonologist for further recommendations.

## 2020-01-17 DIAGNOSIS — J441 Chronic obstructive pulmonary disease with (acute) exacerbation: Secondary | ICD-10-CM | POA: Diagnosis not present

## 2020-01-17 DIAGNOSIS — F3110 Bipolar disorder, current episode manic without psychotic features, unspecified: Secondary | ICD-10-CM | POA: Diagnosis not present

## 2020-01-17 DIAGNOSIS — E871 Hypo-osmolality and hyponatremia: Secondary | ICD-10-CM | POA: Diagnosis not present

## 2020-01-17 DIAGNOSIS — K219 Gastro-esophageal reflux disease without esophagitis: Secondary | ICD-10-CM | POA: Diagnosis not present

## 2020-01-17 DIAGNOSIS — I10 Essential (primary) hypertension: Secondary | ICD-10-CM | POA: Diagnosis not present

## 2020-01-17 DIAGNOSIS — R531 Weakness: Secondary | ICD-10-CM | POA: Diagnosis not present

## 2020-01-17 DIAGNOSIS — Z9981 Dependence on supplemental oxygen: Secondary | ICD-10-CM | POA: Diagnosis not present

## 2020-01-21 ENCOUNTER — Telehealth: Payer: Self-pay | Admitting: Osteopathic Medicine

## 2020-01-21 NOTE — Telephone Encounter (Signed)
Rx written by historical provider.

## 2020-01-21 NOTE — Telephone Encounter (Signed)
Pt called.  She needs a refill on her Potassium.  Thank you.

## 2020-01-22 NOTE — Telephone Encounter (Signed)
Please call pharmacy to confirm dose - we don't have a dose specified and I can't find one in med rec

## 2020-01-23 MED ORDER — POTASSIUM CHLORIDE CRYS ER 20 MEQ PO TBCR: 20.0000 meq | EXTENDED_RELEASE_TABLET | Freq: Every day | ORAL | 3 refills | Status: DC

## 2020-01-23 NOTE — Telephone Encounter (Signed)
I sent an email to [place] with [facility] to refile the claim with diagnosis code Z86.39.  It will take 30 days for reprocessing.

## 2020-01-23 NOTE — Telephone Encounter (Signed)
Per [facility] pharmacist - potassium rx never filled at their location, no history for the rx. Contacted pt for clarification on the rx strength. Per pt, request is for Potassium 20 Meq. Please send to [place].

## 2020-01-26 DIAGNOSIS — R531 Weakness: Secondary | ICD-10-CM | POA: Diagnosis not present

## 2020-01-26 DIAGNOSIS — I10 Essential (primary) hypertension: Secondary | ICD-10-CM | POA: Diagnosis not present

## 2020-01-26 DIAGNOSIS — K219 Gastro-esophageal reflux disease without esophagitis: Secondary | ICD-10-CM | POA: Diagnosis not present

## 2020-01-26 DIAGNOSIS — E871 Hypo-osmolality and hyponatremia: Secondary | ICD-10-CM | POA: Diagnosis not present

## 2020-01-26 DIAGNOSIS — J441 Chronic obstructive pulmonary disease with (acute) exacerbation: Secondary | ICD-10-CM | POA: Diagnosis not present

## 2020-01-26 DIAGNOSIS — F3110 Bipolar disorder, current episode manic without psychotic features, unspecified: Secondary | ICD-10-CM | POA: Diagnosis not present

## 2020-01-30 DIAGNOSIS — K219 Gastro-esophageal reflux disease without esophagitis: Secondary | ICD-10-CM | POA: Diagnosis not present

## 2020-01-30 DIAGNOSIS — J441 Chronic obstructive pulmonary disease with (acute) exacerbation: Secondary | ICD-10-CM | POA: Diagnosis not present

## 2020-01-30 DIAGNOSIS — F3110 Bipolar disorder, current episode manic without psychotic features, unspecified: Secondary | ICD-10-CM | POA: Diagnosis not present

## 2020-01-30 DIAGNOSIS — I10 Essential (primary) hypertension: Secondary | ICD-10-CM | POA: Diagnosis not present

## 2020-01-30 DIAGNOSIS — R531 Weakness: Secondary | ICD-10-CM | POA: Diagnosis not present

## 2020-01-30 DIAGNOSIS — E871 Hypo-osmolality and hyponatremia: Secondary | ICD-10-CM | POA: Diagnosis not present

## 2020-02-02 DIAGNOSIS — J441 Chronic obstructive pulmonary disease with (acute) exacerbation: Secondary | ICD-10-CM | POA: Diagnosis not present

## 2020-02-02 DIAGNOSIS — J9611 Chronic respiratory failure with hypoxia: Secondary | ICD-10-CM | POA: Diagnosis not present

## 2020-02-02 DIAGNOSIS — R059 Cough, unspecified: Secondary | ICD-10-CM | POA: Diagnosis not present

## 2020-02-02 DIAGNOSIS — F17200 Nicotine dependence, unspecified, uncomplicated: Secondary | ICD-10-CM | POA: Diagnosis not present

## 2020-02-03 ENCOUNTER — Other Ambulatory Visit: Payer: Self-pay | Admitting: Osteopathic Medicine

## 2020-02-04 DIAGNOSIS — R0602 Shortness of breath: Secondary | ICD-10-CM | POA: Diagnosis not present

## 2020-02-04 DIAGNOSIS — I10 Essential (primary) hypertension: Secondary | ICD-10-CM | POA: Diagnosis not present

## 2020-02-04 DIAGNOSIS — Z955 Presence of coronary angioplasty implant and graft: Secondary | ICD-10-CM | POA: Diagnosis not present

## 2020-02-04 DIAGNOSIS — I2510 Atherosclerotic heart disease of native coronary artery without angina pectoris: Secondary | ICD-10-CM | POA: Diagnosis not present

## 2020-02-05 DIAGNOSIS — E871 Hypo-osmolality and hyponatremia: Secondary | ICD-10-CM | POA: Diagnosis not present

## 2020-02-05 DIAGNOSIS — I10 Essential (primary) hypertension: Secondary | ICD-10-CM | POA: Diagnosis not present

## 2020-02-05 DIAGNOSIS — K219 Gastro-esophageal reflux disease without esophagitis: Secondary | ICD-10-CM | POA: Diagnosis not present

## 2020-02-05 DIAGNOSIS — J441 Chronic obstructive pulmonary disease with (acute) exacerbation: Secondary | ICD-10-CM | POA: Diagnosis not present

## 2020-02-05 DIAGNOSIS — R531 Weakness: Secondary | ICD-10-CM | POA: Diagnosis not present

## 2020-02-05 DIAGNOSIS — F3110 Bipolar disorder, current episode manic without psychotic features, unspecified: Secondary | ICD-10-CM | POA: Diagnosis not present

## 2020-02-11 ENCOUNTER — Telehealth: Payer: Self-pay

## 2020-02-11 DIAGNOSIS — J441 Chronic obstructive pulmonary disease with (acute) exacerbation: Secondary | ICD-10-CM | POA: Diagnosis not present

## 2020-02-11 DIAGNOSIS — E871 Hypo-osmolality and hyponatremia: Secondary | ICD-10-CM | POA: Diagnosis not present

## 2020-02-11 DIAGNOSIS — K219 Gastro-esophageal reflux disease without esophagitis: Secondary | ICD-10-CM | POA: Diagnosis not present

## 2020-02-11 DIAGNOSIS — I10 Essential (primary) hypertension: Secondary | ICD-10-CM | POA: Diagnosis not present

## 2020-02-11 DIAGNOSIS — R531 Weakness: Secondary | ICD-10-CM | POA: Diagnosis not present

## 2020-02-11 DIAGNOSIS — F3110 Bipolar disorder, current episode manic without psychotic features, unspecified: Secondary | ICD-10-CM | POA: Diagnosis not present

## 2020-02-11 NOTE — Telephone Encounter (Signed)
DPT Leda Gauze called with concerns regarding the patient. Per physical therapist, she was informed by the patient she had stopped taking all of her medications. Patient mentioned that the medications are causing her to be sick. Unsure if patient stopped her medications 3 days ago or one week ago. She stated that the patient's legs were so swollen, that it appear as though they would "pop". Patient was also having some labored breathing/wheezing that was noticeable on her left lung. According to the physical therapist - patient has gain weight overnight 164 lbs to 169.5 lbs. Leda Gauze informed the patient to seek care in the ED, however patient refused. Pt has been discharged from physical therapy due to non-compliance.

## 2020-02-12 DIAGNOSIS — R0602 Shortness of breath: Secondary | ICD-10-CM | POA: Diagnosis not present

## 2020-02-12 DIAGNOSIS — I10 Essential (primary) hypertension: Secondary | ICD-10-CM | POA: Diagnosis not present

## 2020-02-13 NOTE — Telephone Encounter (Signed)
Spoke to patient and her sister. Per pt, she has resumed taking her medications. She continues to have severe swelling in her lower extremities. Pt had an appointment with the Pulmonologist specialist. The provider informed the patient to go to the ER. Pt refused to follow the provider's recommendation. She also mentioned that she was barely able to walk due to the swelling and pain in her legs. Pt was informed to report to the nearest hospital for a evaluation. Pt mentioned to her sister that her provider has also suggested for her to go to the ER. Pt stated that she will think about going to the hospital. Pt was made aware of the risks. No other inquiries during the call.

## 2020-02-13 NOTE — Telephone Encounter (Signed)
Please call patient's emergency contact, Sister: Kathlee Nations.  I am concerned about patient safety given the symptoms, concern for heart failure exacerbation, mental health exacerbation, or worse.  Could not stay further without office visit of some sort or patient needs to be evaluated at urgent care or in the emergency department.

## 2020-02-17 NOTE — Telephone Encounter (Signed)
Noted. Any other concerns from patient or from home health will need to be addressed in office visit or pt needs to seek ER/UC care as recommended.

## 2020-02-27 DIAGNOSIS — R069 Unspecified abnormalities of breathing: Secondary | ICD-10-CM | POA: Diagnosis not present

## 2020-02-27 DIAGNOSIS — F17210 Nicotine dependence, cigarettes, uncomplicated: Secondary | ICD-10-CM | POA: Diagnosis not present

## 2020-02-27 DIAGNOSIS — R0602 Shortness of breath: Secondary | ICD-10-CM | POA: Diagnosis not present

## 2020-02-27 DIAGNOSIS — W19XXXA Unspecified fall, initial encounter: Secondary | ICD-10-CM | POA: Diagnosis not present

## 2020-02-27 DIAGNOSIS — Z20822 Contact with and (suspected) exposure to covid-19: Secondary | ICD-10-CM | POA: Diagnosis not present

## 2020-02-27 DIAGNOSIS — R600 Localized edema: Secondary | ICD-10-CM | POA: Diagnosis not present

## 2020-02-27 DIAGNOSIS — R609 Edema, unspecified: Secondary | ICD-10-CM | POA: Diagnosis not present

## 2020-02-27 DIAGNOSIS — J441 Chronic obstructive pulmonary disease with (acute) exacerbation: Secondary | ICD-10-CM | POA: Diagnosis not present

## 2020-02-27 DIAGNOSIS — I959 Hypotension, unspecified: Secondary | ICD-10-CM | POA: Diagnosis not present

## 2020-03-01 ENCOUNTER — Telehealth: Payer: Self-pay

## 2020-03-01 DIAGNOSIS — C3490 Malignant neoplasm of unspecified part of unspecified bronchus or lung: Secondary | ICD-10-CM | POA: Diagnosis not present

## 2020-03-01 DIAGNOSIS — J9611 Chronic respiratory failure with hypoxia: Secondary | ICD-10-CM | POA: Diagnosis not present

## 2020-03-01 DIAGNOSIS — R918 Other nonspecific abnormal finding of lung field: Secondary | ICD-10-CM | POA: Diagnosis not present

## 2020-03-01 DIAGNOSIS — F17210 Nicotine dependence, cigarettes, uncomplicated: Secondary | ICD-10-CM | POA: Diagnosis not present

## 2020-03-01 DIAGNOSIS — J3089 Other allergic rhinitis: Secondary | ICD-10-CM | POA: Diagnosis not present

## 2020-03-01 DIAGNOSIS — C3411 Malignant neoplasm of upper lobe, right bronchus or lung: Secondary | ICD-10-CM | POA: Diagnosis not present

## 2020-03-01 DIAGNOSIS — J441 Chronic obstructive pulmonary disease with (acute) exacerbation: Secondary | ICD-10-CM | POA: Diagnosis not present

## 2020-03-01 NOTE — Telephone Encounter (Signed)
Transition Care Management Follow-up Telephone Call  Date of discharge and from where: 02/27/2020 from [facility]   How have you been since you were released from the hospital? Spoke to [name]), she states that pt is doing much better since she has been home.   Any questions or concerns? No  Items Reviewed:  Did the pt receive and understand the discharge instructions provided? Yes   Medications obtained and verified? Yes   Other? No   Any new allergies since your discharge? No   Dietary orders reviewed? Heart Healthy.   Do you have support at home? Yes   Functional Questionnaire: (I = Independent and D = Dependent) ADLs: I and D  Bathing/Dressing- I  Meal Prep- D  Eating- I  Maintaining continence- D  Transferring/Ambulation- I  Managing Meds- I  Follow up appointments reviewed:   PCP Hospital f/u appt confirmed? No    Specialist Hospital f/u appt confirmed? Yes  Lung specialist outside of [place] this week.  Are transportation arrangements needed? No    If their condition worsens, is the pt aware to call PCP or go to the Emergency Dept.? Yes  Was the patient provided with contact information for the PCP's office or ED? Yes  Was to pt encouraged to call back with questions or concerns? Yes

## 2020-03-10 ENCOUNTER — Other Ambulatory Visit: Payer: Self-pay | Admitting: *Deleted

## 2020-03-10 DIAGNOSIS — F17210 Nicotine dependence, cigarettes, uncomplicated: Secondary | ICD-10-CM | POA: Diagnosis not present

## 2020-03-10 DIAGNOSIS — J449 Chronic obstructive pulmonary disease, unspecified: Secondary | ICD-10-CM | POA: Diagnosis not present

## 2020-03-10 DIAGNOSIS — C3411 Malignant neoplasm of upper lobe, right bronchus or lung: Secondary | ICD-10-CM | POA: Diagnosis not present

## 2020-03-19 ENCOUNTER — Other Ambulatory Visit: Payer: Self-pay | Admitting: *Deleted

## 2020-03-19 NOTE — Patient Outreach (Signed)
[facility]) Care Management  03/19/2020  Anita Krause 24-May-1940 [id number]   RN Health Coach telephone call to patient. Patient sister Kathlee Nations answered the phone and stated she was the caregiver. Per liz they had gotten so many companies call and wanting to come out. RN explained the purpose of [facility] and the role of the RN, SW and Pharmacy.THe patient stated that she would like for me to call back that she didn't feel up to talking today.  Plan: RN will follow up outreach within the month of April. RN sent educational material on COPD exacerbation RN sent a COPD packet  [place] Care Management [phone]

## 2020-04-13 DIAGNOSIS — G4733 Obstructive sleep apnea (adult) (pediatric): Secondary | ICD-10-CM | POA: Diagnosis not present

## 2020-04-13 DIAGNOSIS — J9611 Chronic respiratory failure with hypoxia: Secondary | ICD-10-CM | POA: Diagnosis not present

## 2020-04-13 DIAGNOSIS — F17210 Nicotine dependence, cigarettes, uncomplicated: Secondary | ICD-10-CM | POA: Diagnosis not present

## 2020-04-13 DIAGNOSIS — J441 Chronic obstructive pulmonary disease with (acute) exacerbation: Secondary | ICD-10-CM | POA: Diagnosis not present

## 2020-04-13 DIAGNOSIS — C3411 Malignant neoplasm of upper lobe, right bronchus or lung: Secondary | ICD-10-CM | POA: Diagnosis not present

## 2020-04-21 ENCOUNTER — Ambulatory Visit (INDEPENDENT_AMBULATORY_CARE_PROVIDER_SITE_OTHER): Payer: Medicare Other | Admitting: Osteopathic Medicine

## 2020-04-21 ENCOUNTER — Other Ambulatory Visit: Payer: Self-pay

## 2020-04-21 ENCOUNTER — Encounter: Payer: Self-pay | Admitting: Osteopathic Medicine

## 2020-04-21 VITALS — BP 115/53 | HR 90 | Temp 98.4°F | Wt 159.0 lb

## 2020-04-21 DIAGNOSIS — M62838 Other muscle spasm: Secondary | ICD-10-CM

## 2020-04-21 DIAGNOSIS — R609 Edema, unspecified: Secondary | ICD-10-CM

## 2020-04-21 DIAGNOSIS — I872 Venous insufficiency (chronic) (peripheral): Secondary | ICD-10-CM

## 2020-04-21 DIAGNOSIS — I739 Peripheral vascular disease, unspecified: Secondary | ICD-10-CM

## 2020-04-21 DIAGNOSIS — J42 Unspecified chronic bronchitis: Secondary | ICD-10-CM

## 2020-04-21 MED ORDER — AMBULATORY NON FORMULARY MEDICATION: 99 refills | Status: DC

## 2020-04-21 MED ORDER — FUROSEMIDE 20 MG PO TABS: 20.0000 mg | ORAL_TABLET | Freq: Every day | ORAL | 1 refills | Status: DC

## 2020-04-21 MED ORDER — CYCLOBENZAPRINE HCL 5 MG PO TABS: 5.0000 mg | ORAL_TABLET | Freq: Three times a day (TID) | ORAL | 1 refills | Status: DC | PRN

## 2020-04-21 MED ORDER — MELOXICAM 7.5 MG PO TABS: ORAL_TABLET | ORAL | 1 refills | Status: DC

## 2020-04-21 NOTE — Patient Instructions (Addendum)
Plan:  Follow up with pulmonary for steroids, other lung medicines  Compression stockings Rx and refilled furosemide for the swelling. This is going to be permanent due to damage to the blood vessels from smoking. Management requires gentle compression, elevation of legs.   Sent refill cyclobenzaprine muscle relaxer and starting meloxicam pain medications for lower back.   Carvedilol twice per day. Triamterene-HCT once per day.

## 2020-04-21 NOTE — Progress Notes (Signed)
Anita Krause is a 80 y.o. female who presents to  [place] at [facility]  today, 04/21/20, seeking care for the following:  . Concern for LE edema.  Persistent ankle edema, improved after time with legs elevated.  Has been refusing to wear any sort of compression stocking  . Concern for COPD, questions about steroids. Reviewed recent pulmonology notes 04/13/20: Rx prednisone taper and levaquin for COPD exacerbation  . Requesting refill on muscle relaxers and requesting pain medications for low back pain.     ASSESSMENT & PLAN with other pertinent findings:  The primary encounter diagnosis was Dependent edema. Diagnoses of Peripheral vascular disease ([place]), Venous stasis dermatitis of both lower extremities, Chronic bronchitis, unspecified chronic bronchitis type ([place]), and Muscle spasm were also pertinent to this visit.    Patient Instructions  Plan:  Follow up with pulmonary for steroids, other lung medicines  Compression stockings Rx and refilled furosemide for the swelling. This is going to be permanent due to damage to the blood vessels from smoking. Management requires gentle compression, elevation of legs.   Sent refill cyclobenzaprine muscle relaxer and starting meloxicam pain medications for lower back.   Carvedilol twice per day. Triamterene-HCT once per day.      No orders of the defined types were placed in this encounter.   Meds ordered this encounter  Medications  . furosemide (LASIX) 20 MG tablet    Sig: Take 1 tablet (20 mg total) by mouth daily.    Dispense:  90 tablet    Refill:  1  . cyclobenzaprine (FLEXERIL) 5 MG tablet    Sig: Take 1-2 tablets (5-10 mg total) by mouth 3 (three) times daily as needed for muscle spasms.    Dispense:  90 tablet    Refill:  1  . meloxicam (MOBIC) 7.5 MG tablet    Sig: Take 1 tablet (7.5 mg total) po one or twice daily as needed for aches/pains    Dispense:  90 tablet     Refill:  1  . AMBULATORY NON FORMULARY MEDICATION    Sig: Compression stockings to under knee, measure for fit, compression rating 10-20 mmHg. Disp: as many pairs as desired; refill x99    Dispense:  1 Units    Refill:  99     See below for relevant physical exam findings  See below for recent lab and imaging results reviewed  Medications, allergies, PMH, PSH, SocH, FamH reviewed below    Follow-up instructions: Return if symptoms worsen or fail to improve.                                        Exam:  BP (!) 115/53 (BP Location: Left Arm, Patient Position: Sitting, Cuff Size: Normal)   Pulse 90   Temp 98.4 F (36.9 C) (Oral)   Wt 159 lb (72.1 kg)   BMI 27.29 kg/m   Constitutional: VS see above. General Appearance: alert, well-developed, well-nourished, NAD  Neck: No masses, trachea midline.   Respiratory: Normal respiratory effort. no wheeze, diffuse coarse breath sounds/rhonchi, no rales  Cardiovascular: S1/S2 normal, no rub/gallop auscultated. RRR. +1-2 Lower extremity edema to mid shin  Neurological: Normal balance/coordination. No tremor.  Skin: warm, dry, intact.   Psychiatric: Normal judgment/insight. Normal mood and affect. Oriented x3.   Current Meds  Medication Sig  . albuterol (PROVENTIL) (5 MG/ML) 0.5%  nebulizer solution Take 0.5 mLs (2.5 mg total) by nebulization every 6 (six) hours as needed for wheezing or shortness of breath.  Marland Kitchen albuterol (VENTOLIN HFA) 108 (90 Base) MCG/ACT inhaler Inhale 2 puffs into the lungs every 6 (six) hours as needed for wheezing or shortness of breath.  . ALPRAZolam (XANAX) 0.25 MG tablet Take 0.25 mg by mouth daily as needed.  . AMBULATORY NON FORMULARY MEDICATION Compression stockings to under knee, measure for fit, compression rating 10-20 mmHg. Disp: as many pairs as desired; refill x99  . aspirin EC 81 MG tablet Take 81 mg by mouth daily. Swallow whole.  . benzonatate (TESSALON) 200 MG  capsule Take 1 capsule (200 mg total) by mouth 3 (three) times daily as needed for cough.  . carvedilol (COREG) 6.25 MG tablet Take 6.25 mg by mouth 2 (two) times daily with a meal.  . cetirizine (ZYRTEC) 10 MG tablet Take 1 tablet (10 mg total) by mouth daily.  . Cholecalciferol 25 MCG (1000 UT) tablet Take 1,000 Units by mouth daily.  . hydrOXYzine (ATARAX/VISTARIL) 25 MG tablet Take 1 tablet (25 mg total) by mouth 2 (two) times daily as needed for anxiety.  . meloxicam (MOBIC) 7.5 MG tablet Take 1 tablet (7.5 mg total) po one or twice daily as needed for aches/pains  . montelukast (SINGULAIR) 10 MG tablet Take 10 mg by mouth at bedtime.  Marland Kitchen omeprazole (PRILOSEC) 40 MG capsule Take 1 capsule (40 mg total) by mouth daily.  . potassium chloride SA (KLOR-CON) 20 MEQ tablet Take 1 tablet (20 mEq total) by mouth daily.  . risperiDONE (RISPERDAL) 1 MG tablet Take 1 mg by mouth at bedtime.  . rosuvastatin (CRESTOR) 40 MG tablet Take 40 mg by mouth daily.  . SYMBICORT 80-4.5 MCG/ACT inhaler Inhale into the lungs.  . triamterene-hydrochlorothiazide (DYAZIDE) 37.5-25 MG capsule Take 1 each (1 capsule total) by mouth daily. For high blood pressure  . [DISCONTINUED] cyclobenzaprine (FLEXERIL) 5 MG tablet Take 1 tablet (5 mg total) by mouth 3 (three) times daily as needed for muscle spasms.  . [DISCONTINUED] furosemide (LASIX) 20 MG tablet Take 20 mg by mouth daily.    Allergies  Allergen Reactions  . Erythromycin Anaphylaxis    Throat closes up, ling in mouth peals  . Nitrofurantoin Macrocrystal Anaphylaxis    Pt states her throat closes up  . Penicillins Anaphylaxis  . Colesevelam Hcl Other (See Comments)  . Doxycycline Rash  . Lamotrigine Rash    Patient Active Problem List   Diagnosis Date Noted  . Influenza vaccination declined by patient 10/02/2019  . Stress incontinence due to pelvic organ prolapse 10/02/2019  . Bilateral impacted cerumen 10/02/2019  . Cough 09/18/2019  . Itching  09/18/2019  . Gastroesophageal reflux disease 09/18/2019  . Screening for hypothyroidism 09/18/2019  . Screening for cholesterol level 09/18/2019  . History of non anemic vitamin B12 deficiency 09/18/2019  . Screening for iron deficiency anemia 09/18/2019  . Screening for diabetes mellitus 09/18/2019  . Hypertension 09/18/2019  . 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  . AAA (abdominal aortic aneurysm) without rupture ([place]) 05/03/2017  . Bursitis of hip, right 05/02/2017  . Chronic back pain 03/14/2017  . 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 Abdominal aortic atherosclerosis ([place]) 03/08/2017  . Bipolar 1 disorder, mixed, partial remission ([place]) 09/21/2016  . 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 History of hypertension 09/21/2016  . Tobacco abuse disorder 09/21/2016  . Chronic bronchitis ([place])  09/21/2016  . Functional memory problem 09/21/2016  . Muscle spasm 09/21/2016  . Anxiety 09/21/2016  . Adjustment disorder with disturbance of emotion 08/29/2016    Family History  Problem Relation Age of Onset  . Hyperlipidemia Mother   . Hypertension Mother   . Diabetes Mother   . Heart failure Mother   . Hyperlipidemia Father   . Hypertension Father   . Heart failure Father   . Cancer Sister   . Healthy Brother   . Healthy Brother     Social History   Tobacco Use  Smoking Status Former Smoker  . Packs/day: 0.25  . Types: Cigarettes  Smokeless Tobacco Never Used  Tobacco Comment   09/10/17    Past Surgical History:  Procedure Laterality Date  . ABDOMINAL HYSTERECTOMY    . ANEURYSM COILING    . BACK SURGERY    . CARDIAC SURGERY    . POLYPECTOMY     VOCAL CORD    Immunization History  Administered Date(s) Administered  . Influenza, High Dose Seasonal PF 09/24/2018  . Influenza, Seasonal, Injecte, Preservative Fre 09/24/2018  . Influenza,inj,Quad PF,6+ Mos 09/24/2018  . Influenza-Unspecified 09/24/2018, 09/05/2019  .  PFIZER(Purple Top)SARS-COV-2 Vaccination 02/25/2019, 03/23/2019  . Pneumococcal Polysaccharide-23 05/16/2017  . Pneumococcal-Unspecified 05/16/2017  . Tdap 10/30/2018, 10/30/2018  . Zoster Recombinat (Shingrix) 10/30/2018, 10/30/2018    No results found for this or any previous visit (from the past 2160 hour(s)).  No results found.     All questions at time of visit were answered - patient instructed to contact office with any additional concerns or updates. ER/RTC precautions were reviewed with the patient as applicable.   Please note: manual typing as well as voice recognition software may have been used to produce this document - typos may escape review. Please contact Dr. Sheppard Coil for any needed clarifications.

## 2020-04-26 ENCOUNTER — Other Ambulatory Visit: Payer: Self-pay | Admitting: *Deleted

## 2020-04-26 NOTE — Patient Outreach (Signed)
[facility]) Care Management  04/26/2020  MEKIYAH GLADWELL September 23, 1940 [id number]   RN Health Coach attempted follow up outreach call to patient.  Patient was unavailable. HIPPA compliance voicemail message left with return callback number.  Plan: RN will call patient again within 30 days.  [facility] [phone]

## 2020-04-28 DIAGNOSIS — J449 Chronic obstructive pulmonary disease, unspecified: Secondary | ICD-10-CM | POA: Diagnosis not present

## 2020-04-28 DIAGNOSIS — F17210 Nicotine dependence, cigarettes, uncomplicated: Secondary | ICD-10-CM | POA: Diagnosis not present

## 2020-04-28 DIAGNOSIS — C3411 Malignant neoplasm of upper lobe, right bronchus or lung: Secondary | ICD-10-CM | POA: Diagnosis not present

## 2020-04-28 DIAGNOSIS — J9611 Chronic respiratory failure with hypoxia: Secondary | ICD-10-CM | POA: Diagnosis not present

## 2020-05-04 DIAGNOSIS — C3490 Malignant neoplasm of unspecified part of unspecified bronchus or lung: Secondary | ICD-10-CM | POA: Diagnosis not present

## 2020-05-04 DIAGNOSIS — C3411 Malignant neoplasm of upper lobe, right bronchus or lung: Secondary | ICD-10-CM | POA: Diagnosis not present

## 2020-05-04 DIAGNOSIS — I2510 Atherosclerotic heart disease of native coronary artery without angina pectoris: Secondary | ICD-10-CM | POA: Diagnosis not present

## 2020-05-04 DIAGNOSIS — R590 Localized enlarged lymph nodes: Secondary | ICD-10-CM | POA: Diagnosis not present

## 2020-05-04 DIAGNOSIS — J479 Bronchiectasis, uncomplicated: Secondary | ICD-10-CM | POA: Diagnosis not present

## 2020-05-05 ENCOUNTER — Other Ambulatory Visit: Payer: Self-pay | Admitting: Nurse Practitioner

## 2020-05-05 DIAGNOSIS — I10 Essential (primary) hypertension: Secondary | ICD-10-CM

## 2020-05-06 DIAGNOSIS — F411 Generalized anxiety disorder: Secondary | ICD-10-CM | POA: Diagnosis not present

## 2020-05-06 DIAGNOSIS — F5101 Primary insomnia: Secondary | ICD-10-CM | POA: Diagnosis not present

## 2020-05-06 DIAGNOSIS — F319 Bipolar disorder, unspecified: Secondary | ICD-10-CM | POA: Diagnosis not present

## 2020-05-06 DIAGNOSIS — F17200 Nicotine dependence, unspecified, uncomplicated: Secondary | ICD-10-CM | POA: Diagnosis not present

## 2020-05-12 DIAGNOSIS — C3411 Malignant neoplasm of upper lobe, right bronchus or lung: Secondary | ICD-10-CM | POA: Diagnosis not present

## 2020-05-12 DIAGNOSIS — B372 Candidiasis of skin and nail: Secondary | ICD-10-CM | POA: Diagnosis not present

## 2020-05-25 ENCOUNTER — Other Ambulatory Visit: Payer: Self-pay | Admitting: Nurse Practitioner

## 2020-05-25 DIAGNOSIS — L299 Pruritus, unspecified: Secondary | ICD-10-CM

## 2020-05-26 ENCOUNTER — Encounter: Payer: Self-pay | Admitting: *Deleted

## 2020-05-26 ENCOUNTER — Other Ambulatory Visit: Payer: Self-pay | Admitting: *Deleted

## 2020-05-26 DIAGNOSIS — F17200 Nicotine dependence, unspecified, uncomplicated: Secondary | ICD-10-CM | POA: Diagnosis not present

## 2020-05-26 DIAGNOSIS — J449 Chronic obstructive pulmonary disease, unspecified: Secondary | ICD-10-CM | POA: Diagnosis not present

## 2020-05-26 DIAGNOSIS — C3411 Malignant neoplasm of upper lobe, right bronchus or lung: Secondary | ICD-10-CM | POA: Diagnosis not present

## 2020-05-26 DIAGNOSIS — J9611 Chronic respiratory failure with hypoxia: Secondary | ICD-10-CM | POA: Diagnosis not present

## 2020-06-01 NOTE — Patient Outreach (Signed)
[facility]) Care Management  [place]  06/01/2020   Anita Krause 1940/11/17 [id number]  RN Health Coach telephone call to patient.  Hipaa compliance verified. Per patient she is feeling some better. Patient has just finished a round of prednisone and antibiotics for coughing and not feeling well. She does have some fluid in ankles and is taking diuretics. Per patient she is using her inhalers. She uses a walker to ambulate but has not fallen. 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 Patient is and active smoker.  She has O2 2 l per nasal cannula.   Patient has agreed to follow up outreach calls.   Encounter Medications:  Outpatient Encounter Medications as of 05/26/2020  Medication Sig  . clopidogrel (PLAVIX) 75 MG tablet Take 75 mg by mouth daily.  . furosemide (LASIX) 20 MG tablet Take 1 tablet (20 mg total) by mouth daily.  Marland Kitchen albuterol (PROVENTIL) (5 MG/ML) 0.5% nebulizer solution Take 0.5 mLs (2.5 mg total) by nebulization every 6 (six) hours as needed for wheezing or shortness of breath.  Marland Kitchen albuterol (VENTOLIN HFA) 108 (90 Base) MCG/ACT inhaler Inhale 2 puffs into the lungs every 6 (six) hours as needed for wheezing or shortness of breath.  . ALPRAZolam (XANAX) 0.25 MG tablet Take 0.25 mg by mouth daily as needed.  . AMBULATORY NON FORMULARY MEDICATION Compression stockings to under knee, measure for fit, compression rating 10-20 mmHg. Disp: as many pairs as desired; refill x99  . aspirin EC 81 MG tablet Take 81 mg by mouth daily. Swallow whole.  . benzonatate (TESSALON) 200 MG capsule Take 1 capsule (200 mg total) by mouth 3 (three) times daily as needed for cough.  . carvedilol (COREG) 6.25 MG tablet Take 6.25 mg by mouth 2 (two) times daily with a meal.  . cetirizine (ZYRTEC) 10 MG tablet Take 1 tablet (10 mg total) by mouth daily.  . Cholecalciferol 25 MCG (1000 UT) tablet Take 1,000 Units by mouth daily.  . cyclobenzaprine (FLEXERIL) 5 MG  tablet Take 1-2 tablets (5-10 mg total) by mouth 3 (three) times daily as needed for muscle spasms.  . hydrOXYzine (ATARAX/VISTARIL) 25 MG tablet Take 1 tablet (25 mg total) by mouth 2 (two) times daily as needed for anxiety.  . meloxicam (MOBIC) 7.5 MG tablet Take 1 tablet (7.5 mg total) po one or twice daily as needed for aches/pains  . montelukast (SINGULAIR) 10 MG tablet Take 10 mg by mouth at bedtime.  Marland Kitchen omeprazole (PRILOSEC) 40 MG capsule Take 1 capsule (40 mg total) by mouth daily.  . potassium chloride SA (KLOR-CON) 20 MEQ tablet Take 1 tablet (20 mEq total) by mouth daily.  . risperiDONE (RISPERDAL) 1 MG tablet Take 1 mg by mouth at bedtime.  . rosuvastatin (CRESTOR) 40 MG tablet Take 40 mg by mouth daily.  . SYMBICORT 80-4.5 MCG/ACT inhaler Inhale into the lungs.  . triamterene-hydrochlorothiazide (DYAZIDE) 37.5-25 MG capsule TAKE 1 CAPSULE BY MOUTH TWICE DAILY FOR HIGH BLOOD PRESSURE   No facility-administered encounter medications on file as of 05/26/2020.    Functional Status:  In your present state of health, do you have any difficulty performing the following activities: 09/04/2019  Hearing? N  Vision? N  Difficulty concentrating or making decisions? N  Walking or climbing stairs? Y  Dressing or bathing? Y  Doing errands, shopping? N  Preparing Food and eating ? Y  Using the Toilet? N  In the past six months, have you  accidently leaked urine? N  Do you have problems with loss of bowel control? N  Managing your Medications? N  Managing your Finances? N  Housekeeping or managing your Housekeeping? Y  Some recent data might be hidden    Fall/Depression Screening: Fall Risk  09/04/2019 07/31/2018  Falls in the past year? 0 0  Comment - [facility]i Telephone Survey: data to providers prior to load  Number falls in past yr: 0 -  Injury with Fall? 0 -  Follow up Falls evaluation completed -   PHQ 2/9 Scores 05/26/2020 09/04/2019 09/04/2019 08/26/2018 09/10/2017 09/21/2016  PHQ - 2 Score 0  0 0 6 0 3  PHQ- 9 Score - - - 14 12 20     Assessment:  Goals Addressed            This Visit's Progress   . (THN)Manage Fatigue (Tiredness-COPD)       Timeframe:  Long-Range Goal Priority:  Medium Start [id number]                             Expected End Date:     25053976                 Follow Up Date [id number]   - develop a new routine to improve sleep - eat healthy - get outdoors every day (weather permitting) - use devices that will help like a cane, sock-puller or reacher    Why is this important?    Feeling tired or worn out is a common symptom of COPD (chronic obstructive pulmonary disease).   Learning when you feel your best and when you need rest is important.   Managing the tiredness (fatigue) will help you be active and enjoy life.     Notes:     . (THN)Track and Manage My Symptoms-COPD       Timeframe:  Long-Range Goal Priority:  High Start Date:     02409735                        Expected End Date: 32992426                      Follow Up Date 83419622   - develop a rescue plan - eliminate symptom triggers at home - follow rescue plan if symptoms flare-up - keep follow-up appointments    Why is this important?    Tracking your symptoms and other information about your health helps your doctor plan your care.   Write down the symptoms, the time of day, what you were doing and what medicine you are taking.   You will soon learn how to manage your symptoms.     Notes:     . (THN)Track and Manage My Triggers-COPD       Timeframe:  Long-Range Goal Priority:  High Start Date:     29798921                        Expected End Date:       19417408                Follow Up Date 14481856   - identify and remove indoor air pollutants - limit outdoor activity during cold weather - listen for public air quality announcements every day    Why is this important?    Triggers are activities or  things, like tobacco smoke or cold weather, that make  your COPD (chronic obstructive pulmonary disease) flare-up.   Knowing these triggers helps you plan how to stay away from them.   When you cannot remove them, you can learn how to manage them.     Notes:        Plan:  Follow-up:  Patient agrees to Care Plan and Follow-up. RN provided Neurosurgeon on COPD exacerbation RN provided Neurosurgeon on COPD and physical activity RN provided Neurosurgeon on Eating plan for COPD RN sent a picture sheet of foods high and low in sodium Provided an Emergency planning/management officer Provided a calendar Book Provided a BP monitor RN will follow up outreach within the month of July RN sent PCP an update assessment  [facility] Management [phone]

## 2020-06-01 NOTE — Patient Instructions (Signed)
Goals Addressed            This Visit's Progress   . (THN)Manage Fatigue (Tiredness-COPD)       Timeframe:  Long-Range Goal Priority:  Medium Start [id number]                             Expected End Date:     81191478                 Follow Up Date [id number]   - develop a new routine to improve sleep - eat healthy - get outdoors every day (weather permitting) - use devices that will help like a cane, sock-puller or reacher    Why is this important?    Feeling tired or worn out is a common symptom of COPD (chronic obstructive pulmonary disease).   Learning when you feel your best and when you need rest is important.   Managing the tiredness (fatigue) will help you be active and enjoy life.     Notes:     . (THN)Track and Manage My Symptoms-COPD       Timeframe:  Long-Range Goal Priority:  High Start Date:     86578469                        Expected End Date: 62952841                      Follow Up Date 32440102   - develop a rescue plan - eliminate symptom triggers at home - follow rescue plan if symptoms flare-up - keep follow-up appointments    Why is this important?    Tracking your symptoms and other information about your health helps your doctor plan your care.   Write down the symptoms, the time of day, what you were doing and what medicine you are taking.   You will soon learn how to manage your symptoms.     Notes:     . (THN)Track and Manage My Triggers-COPD       Timeframe:  Long-Range Goal Priority:  High Start Date:     72536644                        Expected End Date:       03474259                Follow Up Date 56387564   - identify and remove indoor air pollutants - limit outdoor activity during cold weather - listen for public air quality announcements every day    Why is this important?    Triggers are activities or things, like tobacco smoke or cold weather, that make your COPD (chronic obstructive pulmonary disease) flare-up.    Knowing these triggers helps you plan how to stay away from them.   When you cannot remove them, you can learn how to manage them.     Notes:

## 2020-06-18 ENCOUNTER — Telehealth: Payer: Self-pay | Admitting: Osteopathic Medicine

## 2020-06-18 NOTE — Chronic Care Management (AMB) (Signed)
  Chronic Care Management   Note  06/18/2020 Name: Anita Krause MRN: [id number] DOB: 03-19-1940  Anita Krause is a 80 y.o. year old female who is a primary care patient of Emeterio Reeve, DO. I reached out to [employer] by phone today in response to a referral sent by [name]'s PCP, Emeterio Reeve, DO.   [name] was given information about Chronic Care Management services today including:  CCM service includes personalized support from designated clinical staff supervised by her physician, including individualized plan of care and coordination with other care providers 24/7 contact phone numbers for assistance for urgent and routine care needs. Service will only be billed when office clinical staff spend 20 minutes or more in a month to coordinate care. Only one practitioner may furnish and bill the service in a calendar month. The patient may stop CCM services at any time (effective at the end of the month) by phone call to the office staff.   Patient agreed to services and verbal consent obtained.   Follow up plan:   Lauretta Grill [facility] Scheduler

## 2020-07-02 DIAGNOSIS — E785 Hyperlipidemia, unspecified: Secondary | ICD-10-CM | POA: Diagnosis not present

## 2020-07-02 DIAGNOSIS — I2510 Atherosclerotic heart disease of native coronary artery without angina pectoris: Secondary | ICD-10-CM | POA: Diagnosis not present

## 2020-07-02 DIAGNOSIS — F17200 Nicotine dependence, unspecified, uncomplicated: Secondary | ICD-10-CM | POA: Diagnosis not present

## 2020-07-02 DIAGNOSIS — R609 Edema, unspecified: Secondary | ICD-10-CM | POA: Diagnosis not present

## 2020-07-02 DIAGNOSIS — Z955 Presence of coronary angioplasty implant and graft: Secondary | ICD-10-CM | POA: Diagnosis not present

## 2020-07-02 DIAGNOSIS — I10 Essential (primary) hypertension: Secondary | ICD-10-CM | POA: Diagnosis not present

## 2020-07-07 ENCOUNTER — Telehealth: Payer: Self-pay | Admitting: Osteopathic Medicine

## 2020-07-07 ENCOUNTER — Encounter: Payer: Self-pay | Admitting: Osteopathic Medicine

## 2020-07-07 ENCOUNTER — Ambulatory Visit (INDEPENDENT_AMBULATORY_CARE_PROVIDER_SITE_OTHER): Payer: Medicare Other | Admitting: Osteopathic Medicine

## 2020-07-07 ENCOUNTER — Other Ambulatory Visit: Payer: Self-pay

## 2020-07-07 VITALS — BP 97/63 | HR 80 | Temp 98.2°F | Ht 64.0 in | Wt 172.0 lb

## 2020-07-07 DIAGNOSIS — L89321 Pressure ulcer of left buttock, stage 1: Secondary | ICD-10-CM | POA: Diagnosis not present

## 2020-07-07 DIAGNOSIS — L03317 Cellulitis of buttock: Secondary | ICD-10-CM | POA: Diagnosis not present

## 2020-07-07 MED ORDER — NYSTATIN POWD: 1.0000 "application " | Freq: Three times a day (TID) | 3 refills | Status: DC

## 2020-07-07 MED ORDER — CLOTRIMAZOLE 1 % EX CREA: TOPICAL_CREAM | Freq: Two times a day (BID) | CUTANEOUS | 1 refills | Status: AC

## 2020-07-07 MED ORDER — FLUCONAZOLE 150 MG PO TABS: 150.0000 mg | ORAL_TABLET | Freq: Once | ORAL | 1 refills | Status: AC

## 2020-07-07 MED ORDER — SULFAMETHOXAZOLE-TRIMETHOPRIM 800-160 MG PO TABS: 1.0000 | ORAL_TABLET | Freq: Two times a day (BID) | ORAL | 0 refills | Status: DC

## 2020-07-07 NOTE — Progress Notes (Signed)
Anita Krause is a 80 y.o. female who presents to  [place] at [facility]  today, 07/07/20, seeking care for the following:  Rash - here today w/ her sister. Rash on her backside, started on once cheek and spread to the other. Rash under breast, was given clotrimazole lotion which has been helping there but not on the buttock. Reports there for several weeks. ON EXAM macerated area bilateral buttocs gluteal cleft, superficial ulceration on L buttock, mild erythema.  Azithromycin and Prednisone from pulmonary for COPD exacerbation.  Not taking Plavix or Coreg, reviewed cardio notes and this is c/w their plan. She was started on Toprol XL 25 mg daily      ASSESSMENT & PLAN with other pertinent findings:  The primary encounter diagnosis was Cellulitis of buttock. A diagnosis of Pressure injury of left buttock, stage 1 was also pertinent to this visit.    Patient Instructions   Meds ordered this encounter  Medications   fluconazole (DIFLUCAN) 150 MG tablet - ANTIFUNGAL PILL    Sig: Take 1 tablet (150 mg total) by mouth once for 1 dose. Repeat dose 72 hours.    Dispense:  2 tablet    Refill:  1   Nystatin POWD - ANTI-YEAST POWDER    Sig: Place 1 application onto the skin in the morning, at noon, and at bedtime.    Dispense:  1 each    Refill:  3   sulfamethoxazole-trimethoprim (BACTRIM DS) 800-160 MG tablet - ANTIBACTERIAL PILL     Sig: Take 1 tablet by mouth 2 (two) times daily.    Dispense:  10 tablet    Refill:  0   REFERRAL TO HOME HEALTH - NURSE SHOULD BE ABLE TO COME CHECK ON THIS IN THE NEXT COUPLE DAYS   Orders Placed This Encounter  Procedures   Ambulatory referral to [facility] ordered this encounter  Medications   fluconazole (DIFLUCAN) 150 MG tablet    Sig: Take 1 tablet (150 mg total) by mouth once for 1 dose. Repeat dose 72 hours.    Dispense:  2 tablet    Refill:  1   Nystatin POWD    Sig: Place 1  application onto the skin in the morning, at noon, and at bedtime.    Dispense:  1 each    Refill:  3   sulfamethoxazole-trimethoprim (BACTRIM DS) 800-160 MG tablet    Sig: Take 1 tablet by mouth 2 (two) times daily.    Dispense:  10 tablet    Refill:  0     See below for relevant physical exam findings  See below for recent lab and imaging results reviewed  Medications, allergies, PMH, PSH, SocH, FamH reviewed below    Follow-up instructions: Return if symptoms worsen or fail to improve.                                        Exam:  BP 97/63   Pulse 80   Temp 98.2 F (36.8 C)   Ht 5\' 4"  (1.626 m)   Wt 172 lb (78 kg)   SpO2 97%   BMI 29.52 kg/m  Constitutional: VS see above. General Appearance: alert, well-developed, well-nourished, NAD Neck: No masses, trachea midline.  Respiratory: Normal respiratory effort.  Musculoskeletal: Gait normal. Symmetric and independent movement of all extremities Neurological: Normal balance/coordination.  No tremor. Skin: see above.  Psychiatric: Normal judgment/insight. Normal mood and affect. Oriented x3.   Current Meds  Medication Sig   albuterol (PROVENTIL) (5 MG/ML) 0.5% nebulizer solution Take 0.5 mLs (2.5 mg total) by nebulization every 6 (six) hours as needed for wheezing or shortness of breath.   albuterol (VENTOLIN HFA) 108 (90 Base) MCG/ACT inhaler Inhale 2 puffs into the lungs every 6 (six) hours as needed for wheezing or shortness of breath.   ALPRAZolam (XANAX) 0.25 MG tablet Take 0.25 mg by mouth daily as needed.   AMBULATORY NON FORMULARY MEDICATION Compression stockings to under knee, measure for fit, compression rating 10-20 mmHg. Disp: as many pairs as desired; refill x99   aspirin EC 81 MG tablet Take 81 mg by mouth daily. Swallow whole.   azithromycin (ZITHROMAX) 250 MG tablet Take 250 mg by mouth daily.   benzonatate (TESSALON) 200 MG capsule Take 1 capsule (200 mg total) by mouth 3  (three) times daily as needed for cough.   carvedilol (COREG) 6.25 MG tablet Take 6.25 mg by mouth 2 (two) times daily with a meal.   cetirizine (ZYRTEC) 10 MG tablet Take 1 tablet (10 mg total) by mouth daily.   Cholecalciferol 25 MCG (1000 UT) tablet Take 1,000 Units by mouth daily.   clopidogrel (PLAVIX) 75 MG tablet Take 75 mg by mouth daily.   clotrimazole (LOTRIMIN) 1 % cream Apply topically 2 (two) times daily.   cyclobenzaprine (FLEXERIL) 5 MG tablet Take 1-2 tablets (5-10 mg total) by mouth 3 (three) times daily as needed for muscle spasms.   fluconazole (DIFLUCAN) 150 MG tablet Take 1 tablet (150 mg total) by mouth once for 1 dose. Repeat dose 72 hours.   furosemide (LASIX) 20 MG tablet Take 1 tablet (20 mg total) by mouth daily.   hydrOXYzine (ATARAX/VISTARIL) 25 MG tablet Take 1 tablet (25 mg total) by mouth 2 (two) times daily as needed for anxiety.   meloxicam (MOBIC) 7.5 MG tablet Take 1 tablet (7.5 mg total) po one or twice daily as needed for aches/pains   metoprolol succinate (TOPROL-XL) 25 MG 24 hr tablet Take 25 mg by mouth daily.   montelukast (SINGULAIR) 10 MG tablet Take 10 mg by mouth at bedtime.   Nystatin POWD Place 1 application onto the skin in the morning, at noon, and at bedtime.   omeprazole (PRILOSEC) 40 MG capsule Take 1 capsule (40 mg total) by mouth daily.   potassium chloride SA (KLOR-CON) 20 MEQ tablet Take 1 tablet (20 mEq total) by mouth daily.   predniSONE (DELTASONE) 10 MG tablet Take by mouth.   risperiDONE (RISPERDAL) 1 MG tablet Take 1 mg by mouth at bedtime.   rosuvastatin (CRESTOR) 40 MG tablet Take 40 mg by mouth daily.   sulfamethoxazole-trimethoprim (BACTRIM DS) 800-160 MG tablet Take 1 tablet by mouth 2 (two) times daily.   SYMBICORT 80-4.5 MCG/ACT inhaler Inhale into the lungs.   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 10/02/2019   Bilateral impacted cerumen 10/02/2019   Cough 09/18/2019   Itching 09/18/2019   Gastroesophageal reflux disease 09/18/2019   Screening for hypothyroidism 09/18/2019   Screening for cholesterol level 09/18/2019   History of non anemic vitamin B12 deficiency 09/18/2019   Screening for iron deficiency anemia 09/18/2019   Screening for diabetes mellitus 09/18/2019   Hypertension 09/18/2019   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   AAA (abdominal aortic aneurysm) without rupture ([place]) 05/03/2017   Bursitis of hip, right 05/02/2017   Chronic back pain 03/14/2017   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   Abdominal aortic atherosclerosis ([place]) 03/08/2017   Bipolar 1 disorder, mixed, partial remission ([place]) 09/21/2016   Insomnia due to other mental disorder 09/21/2016   History of hypertension 09/21/2016   Tobacco abuse disorder 09/21/2016   Chronic bronchitis ([place]) 09/21/2016   Functional memory problem 09/21/2016   Muscle spasm 09/21/2016   Anxiety 09/21/2016   Adjustment disorder with disturbance of emotion 08/29/2016    Family History  Problem Relation Age of Onset   Hyperlipidemia Mother    Hypertension Mother    Diabetes Mother    Heart failure Mother    Hyperlipidemia Father    Hypertension Father    Heart failure Father    Cancer Sister    Healthy Brother    Healthy Brother     Social History   Tobacco Use  Smoking Status Former   Packs/day: 0.25   Pack years: 0.00   Types: Cigarettes  Smokeless Tobacco Never  Tobacco  Comments   09/10/17    Past Surgical History:  Procedure Laterality Date   ABDOMINAL HYSTERECTOMY     ANEURYSM COILING     BACK SURGERY     CARDIAC SURGERY     POLYPECTOMY     VOCAL CORD    Immunization History  Administered Date(s) Administered   Influenza, High Dose Seasonal PF 09/24/2018   Influenza, Seasonal, Injecte, Preservative Fre 09/24/2018   Influenza,inj,Quad PF,6+ Mos 09/24/2018   Influenza-Unspecified 09/24/2018, 09/05/2019   PFIZER(Purple Top)SARS-COV-2 Vaccination 02/25/2019, 03/23/2019   Pneumococcal Polysaccharide-23 05/16/2017   Pneumococcal-Unspecified 05/16/2017   Tdap 10/30/2018, 10/30/2018   Zoster Recombinat (Shingrix) 10/30/2018, 10/30/2018    No results found for this or any previous visit (from the past 2160 hour(s)).  No results found.     All questions at time of visit were answered - patient instructed to contact office with any additional concerns or updates. ER/RTC precautions were reviewed with the patient as applicable.   Please note: manual typing as well as voice recognition software may have been used to produce this document - typos may escape review. Please contact Dr. Sheppard Coil for any needed clarifications.

## 2020-07-07 NOTE — Telephone Encounter (Signed)
YES, that's ok

## 2020-07-07 NOTE — Patient Instructions (Signed)
Meds ordered this encounter  Medications   fluconazole (DIFLUCAN) 150 MG tablet - ANTIFUNGAL PILL    Sig: Take 1 tablet (150 mg total) by mouth once for 1 dose. Repeat dose 72 hours.    Dispense:  2 tablet    Refill:  1   Nystatin POWD - ANTI-YEAST POWDER    Sig: Place 1 application onto the skin in the morning, at noon, and at bedtime.    Dispense:  1 each    Refill:  3   sulfamethoxazole-trimethoprim (BACTRIM DS) 800-160 MG tablet - ANTIBACTERIAL PILL     Sig: Take 1 tablet by mouth 2 (two) times daily.    Dispense:  10 tablet    Refill:  0   REFERRAL TO [facility] TO COME CHECK ON THIS IN THE NEXT COUPLE DAYS

## 2020-07-07 NOTE — Telephone Encounter (Addendum)
Dr. Sheppard Coil  Per Baird Lyons at [facility] start of care will be on 7/12.  Is this acceptable?  Jenny Reichmann

## 2020-07-13 DIAGNOSIS — L89890 Pressure ulcer of other site, unstageable: Secondary | ICD-10-CM | POA: Diagnosis not present

## 2020-07-13 DIAGNOSIS — K219 Gastro-esophageal reflux disease without esophagitis: Secondary | ICD-10-CM | POA: Diagnosis not present

## 2020-07-13 DIAGNOSIS — Z87891 Personal history of nicotine dependence: Secondary | ICD-10-CM | POA: Diagnosis not present

## 2020-07-13 DIAGNOSIS — I10 Essential (primary) hypertension: Secondary | ICD-10-CM | POA: Diagnosis not present

## 2020-07-13 DIAGNOSIS — J449 Chronic obstructive pulmonary disease, unspecified: Secondary | ICD-10-CM | POA: Diagnosis not present

## 2020-07-13 DIAGNOSIS — N393 Stress incontinence (female) (male): Secondary | ICD-10-CM | POA: Diagnosis not present

## 2020-07-13 DIAGNOSIS — Z9181 History of falling: Secondary | ICD-10-CM | POA: Diagnosis not present

## 2020-07-13 DIAGNOSIS — I714 Abdominal aortic aneurysm, without rupture: Secondary | ICD-10-CM | POA: Diagnosis not present

## 2020-07-13 DIAGNOSIS — M461 Sacroiliitis, not elsewhere classified: Secondary | ICD-10-CM | POA: Diagnosis not present

## 2020-07-13 DIAGNOSIS — Z8679 Personal history of other diseases of the circulatory system: Secondary | ICD-10-CM | POA: Diagnosis not present

## 2020-07-13 DIAGNOSIS — L03317 Cellulitis of buttock: Secondary | ICD-10-CM | POA: Diagnosis not present

## 2020-07-13 DIAGNOSIS — Z7951 Long term (current) use of inhaled steroids: Secondary | ICD-10-CM | POA: Diagnosis not present

## 2020-07-13 DIAGNOSIS — F3177 Bipolar disorder, in partial remission, most recent episode mixed: Secondary | ICD-10-CM | POA: Diagnosis not present

## 2020-07-13 DIAGNOSIS — G4701 Insomnia due to medical condition: Secondary | ICD-10-CM | POA: Diagnosis not present

## 2020-07-13 DIAGNOSIS — F419 Anxiety disorder, unspecified: Secondary | ICD-10-CM | POA: Diagnosis not present

## 2020-07-13 DIAGNOSIS — Z79899 Other long term (current) drug therapy: Secondary | ICD-10-CM | POA: Diagnosis not present

## 2020-07-13 DIAGNOSIS — M7071 Other bursitis of hip, right hip: Secondary | ICD-10-CM | POA: Diagnosis not present

## 2020-07-13 DIAGNOSIS — Z7952 Long term (current) use of systemic steroids: Secondary | ICD-10-CM | POA: Diagnosis not present

## 2020-07-13 DIAGNOSIS — N8189 Other female genital prolapse: Secondary | ICD-10-CM | POA: Diagnosis not present

## 2020-07-13 DIAGNOSIS — L89322 Pressure ulcer of left buttock, stage 2: Secondary | ICD-10-CM | POA: Diagnosis not present

## 2020-07-13 DIAGNOSIS — I700 Atherosclerosis of aorta: Secondary | ICD-10-CM | POA: Diagnosis not present

## 2020-07-13 DIAGNOSIS — F4325 Adjustment disorder with mixed disturbance of emotions and conduct: Secondary | ICD-10-CM | POA: Diagnosis not present

## 2020-07-13 DIAGNOSIS — M47816 Spondylosis without myelopathy or radiculopathy, lumbar region: Secondary | ICD-10-CM | POA: Diagnosis not present

## 2020-07-13 DIAGNOSIS — E538 Deficiency of other specified B group vitamins: Secondary | ICD-10-CM | POA: Diagnosis not present

## 2020-07-14 ENCOUNTER — Telehealth: Payer: Self-pay | Admitting: *Deleted

## 2020-07-14 DIAGNOSIS — I10 Essential (primary) hypertension: Secondary | ICD-10-CM | POA: Diagnosis not present

## 2020-07-14 DIAGNOSIS — R609 Edema, unspecified: Secondary | ICD-10-CM | POA: Diagnosis not present

## 2020-07-14 DIAGNOSIS — Z955 Presence of coronary angioplasty implant and graft: Secondary | ICD-10-CM | POA: Diagnosis not present

## 2020-07-14 DIAGNOSIS — I2510 Atherosclerotic heart disease of native coronary artery without angina pectoris: Secondary | ICD-10-CM | POA: Diagnosis not present

## 2020-07-14 DIAGNOSIS — E785 Hyperlipidemia, unspecified: Secondary | ICD-10-CM | POA: Diagnosis not present

## 2020-07-14 DIAGNOSIS — Z23 Encounter for immunization: Secondary | ICD-10-CM | POA: Diagnosis not present

## 2020-07-14 NOTE — Telephone Encounter (Signed)
Diane from [facility] left vm requesting VO for skilled nursing for 1x/1 week, 2x/2 weeks, 1x/6 weeks. She also requested co morbidities and current medications.  I left her a vm to return call for this information.

## 2020-07-15 NOTE — Telephone Encounter (Signed)
Spoke with Diane to update current medications based on last OV with Dr. Sheppard Coil. Diane reported that she will be using the creama nd powder prescribed by Dr. Sheppard Coil and keep [place] updated on patient's progress, as well as, using non-weight bearing techniques.

## 2020-07-21 DIAGNOSIS — Z20822 Contact with and (suspected) exposure to covid-19: Secondary | ICD-10-CM | POA: Diagnosis not present

## 2020-07-22 ENCOUNTER — Telehealth: Payer: Self-pay | Admitting: Pharmacist

## 2020-07-22 DIAGNOSIS — L89322 Pressure ulcer of left buttock, stage 2: Secondary | ICD-10-CM | POA: Diagnosis not present

## 2020-07-22 DIAGNOSIS — L89890 Pressure ulcer of other site, unstageable: Secondary | ICD-10-CM | POA: Diagnosis not present

## 2020-07-22 DIAGNOSIS — L03317 Cellulitis of buttock: Secondary | ICD-10-CM | POA: Diagnosis not present

## 2020-07-22 DIAGNOSIS — J449 Chronic obstructive pulmonary disease, unspecified: Secondary | ICD-10-CM | POA: Diagnosis not present

## 2020-07-22 DIAGNOSIS — I10 Essential (primary) hypertension: Secondary | ICD-10-CM | POA: Diagnosis not present

## 2020-07-22 DIAGNOSIS — F3177 Bipolar disorder, in partial remission, most recent episode mixed: Secondary | ICD-10-CM | POA: Diagnosis not present

## 2020-07-22 NOTE — Chronic Care Management (AMB) (Signed)
Chronic Care Management Pharmacy Assistant   Name: Anita Krause  MRN: [id number] DOB: 03/31/1940  Anita Krause is an 80 y.o. year old female who presents for his initial CCM visit with the clinical pharmacist.  Reason for Encounter: Initial CCM Visit    Recent office visits:  07/07/20 Anita Reeve, DO PCP - Seen for cellulitis of buttocks. Referral placed to home health. Start diflucan 150 mg, Nystatin powder and Bactrim DS. Return as needed.   04/21/20 Anita Reeve, DO PCP - Seen for edema. Prescriptioon given for compression stockings. Start meloxicam for lower back pain. Return as needed.   Recent consult visits:  07/02/20 Anita Krause Cardiology - Seen for coronary artery disease. Discontinue carvedilol 3.125 mg and start Toprol XL 25 mg daily.Return in 4 months.   05/26/20 Anita Krause Pulmonology - Seen for follow up. Discontinue Trelegy. Start [place], [place], yupetri.   05/12/20 Anita Krause Hematology Primary cancer of right upper lung.  05/06/20 Anita Krause Psychiatry- No notes available.  04/28/20 Anita Krause Pulmonology - seen for follow up. Start azithromycin 250 mg and start Trelegy Ellipta. Discontinue Budeson-Glycopyrrol- Formoterol.   04/13/20 Anita Krause pulmonology - Seen for follow up. Start levofloxacin 500 mg daily.   03/10/20 Anita Krause Pulmonology - Seen for follow up.  03/01/20 Anita Krause Pulmonology - Seen for follow up. No notes available.   02/04/20 Anita Krause Cardiology - Seen for Shortness of breath. Start Coreg 3.125 mg twice a day and take Dyazide once a day. Labs done. Return for follow-up with Dr. Mauricio Po in 4 months.   02/02/20 Anita Krause Pulmonology - Seen for follow up. Start carvedilol 3.125 mg 2 times daily.   Hospital visits:  Medication Reconciliation was completed by comparing discharge summary, patient's EMR and Pharmacy list, and upon discussion with patient.  Admitted to the hospital on 02/27/20 due  to COPD exacerbation. Discharge date was 02/27/20. Discharged from [facility]?Medications Started at [facility] Discharge:?? -started none noted  Medication Changes at Hospital Discharge: -Changed none  Medications Discontinued at Hospital Discharge: -Stopped none  Medications that remain the same after Hospital Discharge:??  -All other medications will remain the same.    Medications: Outpatient Encounter Medications as of 07/22/2020  Medication Sig   albuterol (PROVENTIL) (5 MG/ML) 0.5% nebulizer solution Take 0.5 mLs (2.5 mg total) by nebulization every 6 (six) hours as needed for wheezing or shortness of breath.   albuterol (VENTOLIN HFA) 108 (90 Base) MCG/ACT inhaler Inhale 2 puffs into the lungs every 6 (six) hours as needed for wheezing or shortness of breath.   ALPRAZolam (XANAX) 0.25 MG tablet Take 0.25 mg by mouth daily as needed.   AMBULATORY NON FORMULARY MEDICATION Compression stockings to under knee, measure for fit, compression rating 10-20 mmHg. Disp: as many pairs as desired; refill x99   aspirin EC 81 MG tablet Take 81 mg by mouth daily. Swallow whole.   azithromycin (ZITHROMAX) 250 MG tablet Take 250 mg by mouth daily.   benzonatate (TESSALON) 200 MG capsule Take 1 capsule (200 mg total) by mouth 3 (three) times daily as needed for cough.   carvedilol (COREG) 6.25 MG tablet Take 6.25 mg by mouth 2 (two) times daily with a meal.   cetirizine (ZYRTEC) 10 MG tablet Take 1 tablet (10 mg total) by mouth daily.   Cholecalciferol 25 MCG (1000 UT) tablet Take 1,000 Units by mouth daily.   clopidogrel (PLAVIX) 75 MG tablet Take 75 mg by  mouth daily.   clotrimazole (LOTRIMIN) 1 % cream Apply topically 2 (two) times daily.   cyclobenzaprine (FLEXERIL) 5 MG tablet Take 1-2 tablets (5-10 mg total) by mouth 3 (three) times daily as needed for muscle spasms.   furosemide (LASIX) 20 MG tablet Take 1 tablet (20 mg total) by mouth daily.   hydrOXYzine  (ATARAX/VISTARIL) 25 MG tablet Take 1 tablet (25 mg total) by mouth 2 (two) times daily as needed for anxiety.   meloxicam (MOBIC) 7.5 MG tablet Take 1 tablet (7.5 mg total) po one or twice daily as needed for aches/pains   metoprolol succinate (TOPROL-XL) 25 MG 24 hr tablet Take 25 mg by mouth daily.   montelukast (SINGULAIR) 10 MG tablet Take 10 mg by mouth at bedtime.   Nystatin POWD Place 1 application onto the skin in the morning, at noon, and at bedtime.   omeprazole (PRILOSEC) 40 MG capsule Take 1 capsule (40 mg total) by mouth daily.   potassium chloride SA (KLOR-CON) 20 MEQ tablet Take 1 tablet (20 mEq total) by mouth daily.   predniSONE (DELTASONE) 10 MG tablet Take by mouth.   risperiDONE (RISPERDAL) 1 MG tablet Take 1 mg by mouth at bedtime.   rosuvastatin (CRESTOR) 40 MG tablet Take 40 mg by mouth daily.   sulfamethoxazole-trimethoprim (BACTRIM DS) 800-160 MG tablet Take 1 tablet by mouth 2 (two) times daily.   SYMBICORT 80-4.5 MCG/ACT inhaler Inhale into the lungs.   triamterene-hydrochlorothiazide (DYAZIDE) 37.5-25 MG capsule TAKE 1 CAPSULE BY MOUTH TWICE DAILY FOR HIGH BLOOD PRESSURE   No facility-administered encounter medications on file as of 07/22/2020.    Medications: albuterol (PROVENTIL) (5 MG/ML) 0.5% nebulizer solution albuterol (VENTOLIN HFA) 108 (90 Base) MCG/ACT inhaler last filled 04/19/20 25 DS ALPRAZolam (XANAX) 0.25 MG tablet last filled 07/03/20 30 DS aspirin EC 81 MG tablet azithromycin (ZITHROMAX) 250 MG tablet last filled 06/29/20 30 DS carvedilol (COREG) 3.125 MG tablet  last filled 04/20/20 90 DS cetirizine (ZYRTEC) 10 MG tablet last filled 06/04/20 90 DS Cholecalciferol 25 MCG (1000 UT) tablet clopidogrel (PLAVIX) 75 MG tablet last filled 04/09/20 90 DS clotrimazole (LOTRIMIN) 1 % cream last filled 07/07/20 20 DS cyclobenzaprine (FLEXERIL) 5 MG tablet last filled 04/21/20 15 DS furosemide (LASIX) 20 MG tablet last filled 05/31/20 90 DS hydrOXYzine  (ATARAX/VISTARIL) 25 MG tablet last filled 06/07/20 30 DS meloxicam (MOBIC) 7.5 MG tablet last filled 04/21/20 45DS metoprolol succinate (TOPROL-XL) 25 MG 24 hr tablet last filled 07/02/20 90 DS montelukast (SINGULAIR) 10 MG tablet last filled 05/31/20 90 DS Nystatin POWD last filled 07/07/20 25 DS omeprazole (PRILOSEC) 40 MG capsule last filled 07/08/20 90 DS potassium chloride SA (KLOR-CON) 20 MEQ tablet last filled 06/04/20 90 DS predniSONE (DELTASONE) 10 MG tablet last filled 04/29/20 90 DS risperiDONE (RISPERDAL) 1 MG tablet last filled 06/04/20 90 DS rosuvastatin (CRESTOR) 40 MG tablet last filled 04/09/20 90 DS sulfamethoxazole-trimethoprim (BACTRIM DS) 800-160 MG tablet last filled 07/07/20 5 DS SYMBICORT 80-4.5 MCG/ACT inhaler last filled 003/03/22 30 DS triamterene-hydrochlorothiazide (DYAZIDE) 37.5-25 MG capsule last filled 05/06/20 90 DS   Star Rating Drugs: rosuvastatin (CRESTOR) 40 MG tablet last filled 04/09/20 90 DS  [place] Pharmacist Assistant [phone]

## 2020-07-24 DIAGNOSIS — L89890 Pressure ulcer of other site, unstageable: Secondary | ICD-10-CM | POA: Diagnosis not present

## 2020-07-24 DIAGNOSIS — L03317 Cellulitis of buttock: Secondary | ICD-10-CM | POA: Diagnosis not present

## 2020-07-24 DIAGNOSIS — F3177 Bipolar disorder, in partial remission, most recent episode mixed: Secondary | ICD-10-CM | POA: Diagnosis not present

## 2020-07-24 DIAGNOSIS — I10 Essential (primary) hypertension: Secondary | ICD-10-CM | POA: Diagnosis not present

## 2020-07-24 DIAGNOSIS — L89322 Pressure ulcer of left buttock, stage 2: Secondary | ICD-10-CM | POA: Diagnosis not present

## 2020-07-24 DIAGNOSIS — J449 Chronic obstructive pulmonary disease, unspecified: Secondary | ICD-10-CM | POA: Diagnosis not present

## 2020-07-27 ENCOUNTER — Other Ambulatory Visit: Payer: Self-pay | Admitting: Medical-Surgical

## 2020-07-27 ENCOUNTER — Telehealth: Payer: Self-pay | Admitting: Osteopathic Medicine

## 2020-07-27 DIAGNOSIS — J449 Chronic obstructive pulmonary disease, unspecified: Secondary | ICD-10-CM | POA: Diagnosis not present

## 2020-07-27 DIAGNOSIS — L03317 Cellulitis of buttock: Secondary | ICD-10-CM | POA: Diagnosis not present

## 2020-07-27 DIAGNOSIS — L89322 Pressure ulcer of left buttock, stage 2: Secondary | ICD-10-CM | POA: Diagnosis not present

## 2020-07-27 DIAGNOSIS — F3177 Bipolar disorder, in partial remission, most recent episode mixed: Secondary | ICD-10-CM | POA: Diagnosis not present

## 2020-07-27 DIAGNOSIS — I10 Essential (primary) hypertension: Secondary | ICD-10-CM | POA: Diagnosis not present

## 2020-07-27 DIAGNOSIS — L89890 Pressure ulcer of other site, unstageable: Secondary | ICD-10-CM | POA: Diagnosis not present

## 2020-07-27 NOTE — Telephone Encounter (Signed)
Patient would like to reschedule appointment.

## 2020-07-29 ENCOUNTER — Telehealth: Payer: Medicare Other

## 2020-07-29 ENCOUNTER — Other Ambulatory Visit: Payer: Self-pay | Admitting: *Deleted

## 2020-07-29 DIAGNOSIS — F319 Bipolar disorder, unspecified: Secondary | ICD-10-CM | POA: Diagnosis not present

## 2020-07-29 DIAGNOSIS — F5101 Primary insomnia: Secondary | ICD-10-CM | POA: Diagnosis not present

## 2020-07-29 DIAGNOSIS — F411 Generalized anxiety disorder: Secondary | ICD-10-CM | POA: Diagnosis not present

## 2020-07-30 DIAGNOSIS — F3177 Bipolar disorder, in partial remission, most recent episode mixed: Secondary | ICD-10-CM | POA: Diagnosis not present

## 2020-07-30 DIAGNOSIS — I10 Essential (primary) hypertension: Secondary | ICD-10-CM | POA: Diagnosis not present

## 2020-07-30 DIAGNOSIS — J449 Chronic obstructive pulmonary disease, unspecified: Secondary | ICD-10-CM | POA: Diagnosis not present

## 2020-07-30 DIAGNOSIS — L03317 Cellulitis of buttock: Secondary | ICD-10-CM | POA: Diagnosis not present

## 2020-07-30 DIAGNOSIS — L89322 Pressure ulcer of left buttock, stage 2: Secondary | ICD-10-CM | POA: Diagnosis not present

## 2020-07-30 DIAGNOSIS — L89890 Pressure ulcer of other site, unstageable: Secondary | ICD-10-CM | POA: Diagnosis not present

## 2020-08-02 DIAGNOSIS — Z20822 Contact with and (suspected) exposure to covid-19: Secondary | ICD-10-CM | POA: Diagnosis not present

## 2020-08-03 NOTE — Patient Instructions (Signed)
Goals Addressed             This Visit's Progress    (THN)Manage Fatigue (Tiredness-COPD)   On track    Timeframe:  Long-Range Goal Priority:  Medium Start [id number]                             Expected End Date:     [id number]                 Follow Up Date 16010932   - develop a new routine to improve sleep - eat healthy - get outdoors every day (weather permitting) - use devices that will help like a cane, sock-puller or reacher    Why is this important?   Feeling tired or worn out is a common symptom of COPD (chronic obstructive pulmonary disease).  Learning when you feel your best and when you need rest is important.  Managing the tiredness (fatigue) will help you be active and enjoy life.     Notes:      (THN)Track and Manage My Symptoms-COPD   On track    Timeframe:  Long-Range Goal Priority:  High Start Date:     35573220                        Expected End Date: [id number]                      Follow Up Date [id number]   - develop a rescue plan - eliminate symptom triggers at home - follow rescue plan if symptoms flare-up - keep follow-up appointments    Why is this important?   Tracking your symptoms and other information about your health helps your doctor plan your care.  Write down the symptoms, the time of day, what you were doing and what medicine you are taking.  You will soon learn how to manage your symptoms.     Notes:      (THN)Track and Manage My Triggers-COPD   On track    Timeframe:  Long-Range Goal Priority:  High Start Date:     17616073                        Expected End Date:       71062694                Follow Up Date 85462703   - identify and remove indoor air pollutants - limit outdoor activity during cold weather - listen for public air quality announcements every day    Why is this important?   Triggers are activities or things, like tobacco smoke or cold weather, that make your COPD (chronic obstructive pulmonary disease)  flare-up.  Knowing these triggers helps you plan how to stay away from them.  When you cannot remove them, you can learn how to manage them.     Notes:  [id number] Patient is monitoring her symptoms

## 2020-08-03 NOTE — Patient Outreach (Signed)
[facility]) Care Management  [facility] Care Manager  [phone] Late entry   AFTIN LYE [birth year] [id number]  [place] telephone call to patient.  [place] compliance verified. Per patient she has not had any admissions for COPD exacerbations. Per patient she is taking her medications as per ordered. Per patient her appetite is good. Patient is on O2 3 liters. She has not had any recent falls. Patient has agreed to follow up outreach calls.   Encounter Medications:  Outpatient Encounter Medications as of 07/29/2020  Medication Sig   albuterol (PROVENTIL) (5 MG/ML) 0.5% nebulizer solution Take 0.5 mLs (2.5 mg total) by nebulization every 6 (six) hours as needed for wheezing or shortness of breath.   albuterol (VENTOLIN HFA) 108 (90 Base) MCG/ACT inhaler Inhale 2 puffs into the lungs every 6 (six) hours as needed for wheezing or shortness of breath.   ALPRAZolam (XANAX) 0.25 MG tablet Take 0.25 mg by mouth daily as needed.   AMBULATORY NON FORMULARY MEDICATION Compression stockings to under knee, measure for fit, compression rating 10-20 mmHg. Disp: as many pairs as desired; refill x99   aspirin EC 81 MG tablet Take 81 mg by mouth daily. Swallow whole.   azithromycin (ZITHROMAX) 250 MG tablet Take 250 mg by mouth daily.   benzonatate (TESSALON) 200 MG capsule Take 1 capsule (200 mg total) by mouth 3 (three) times daily as needed for cough.   carvedilol (COREG) 6.25 MG tablet Take 6.25 mg by mouth 2 (two) times daily with a meal.   cetirizine (ZYRTEC) 10 MG tablet Take 1 tablet (10 mg total) by mouth daily.   Cholecalciferol 25 MCG (1000 UT) tablet Take 1,000 Units by mouth daily.   clopidogrel (PLAVIX) 75 MG tablet Take 75 mg by mouth daily.   clotrimazole (LOTRIMIN) 1 % cream Apply topically 2 (two) times daily.   cyclobenzaprine (FLEXERIL) 5 MG tablet Take 1-2 tablets (5-10 mg total) by mouth 3 (three) times daily as needed for muscle spasms.   furosemide (LASIX) 20  MG tablet Take 1 tablet (20 mg total) by mouth daily.   hydrOXYzine (ATARAX/VISTARIL) 25 MG tablet Take 1 tablet (25 mg total) by mouth 2 (two) times daily as needed for anxiety.   meloxicam (MOBIC) 7.5 MG tablet Take 1 tablet (7.5 mg total) po one or twice daily as needed for aches/pains   metoprolol succinate (TOPROL-XL) 25 MG 24 hr tablet Take 25 mg by mouth daily.   montelukast (SINGULAIR) 10 MG tablet Take 10 mg by mouth at bedtime.   Nystatin POWD Place 1 application onto the skin in the morning, at noon, and at bedtime.   omeprazole (PRILOSEC) 40 MG capsule Take 1 capsule (40 mg total) by mouth daily.   potassium chloride SA (KLOR-CON) 20 MEQ tablet Take 1 tablet (20 mEq total) by mouth daily.   predniSONE (DELTASONE) 10 MG tablet Take by mouth.   risperiDONE (RISPERDAL) 1 MG tablet Take 1 mg by mouth at bedtime.   rosuvastatin (CRESTOR) 40 MG tablet Take 40 mg by mouth daily.   sulfamethoxazole-trimethoprim (BACTRIM DS) 800-160 MG tablet Take 1 tablet by mouth 2 (two) times daily.   SYMBICORT 80-4.5 MCG/ACT inhaler Inhale into the lungs.   triamterene-hydrochlorothiazide (DYAZIDE) 37.5-25 MG capsule TAKE 1 CAPSULE BY MOUTH TWICE DAILY FOR HIGH BLOOD PRESSURE   No facility-administered encounter medications on file as of 07/29/2020.    Functional Status:  In your present state of health, do you have any difficulty performing the following  activities: 09/04/2019  Hearing? N  Vision? N  Difficulty concentrating or making decisions? N  Walking or climbing stairs? Y  Dressing or bathing? Y  Doing errands, shopping? N  Preparing Food and eating ? Y  Using the Toilet? N  In the past six months, have you accidently leaked urine? N  Do you have problems with loss of bowel control? N  Managing your Medications? N  Managing your Finances? N  Housekeeping or managing your Housekeeping? Y  Some recent data might be hidden    Fall/Depression Screening: Fall Risk  09/04/2019 07/31/2018   Falls in the past year? 0 0  Comment - [place] Telephone Survey: data to providers prior to load  Number falls in past yr: 0 -  Injury with Fall? 0 -  Follow up Falls evaluation completed -   PHQ 2/9 Scores 05/26/2020 09/04/2019 09/04/2019 08/26/2018 09/10/2017 09/21/2016  PHQ - 2 Score 0 0 0 6 0 3  PHQ- 9 Score - - - 14 12 20     Assessment:   Care Plan Care Plan : COPD (Adult)  Updates made by Verlin Grills, RN since 08/03/2020 12:00 AM     Problem: Disease Progression (COPD)   Priority: High  Onset Date: 05/26/2020     Long-Range Goal: Disease Progression Minimized or Managed   Start Date: 05/26/2020  Expected End Date: 12/31/2020  This Visit's Progress: On track  Priority: High  Note:   Evidence-based guidance:  Identify current smoking/tobacco use; provide smoking cessation intervention.  Assess symptom control by the frequency and type of symptoms, reliever use and activity limitation at every encounter.  Assess risk for exacerbation (flare up) by evaluating spirometry, pulse oximetry, reliever use, presentation of symptoms and activity limitation; anticipate treatment adjustment based on risks and resources.  Develop and/or review and reinforce use of COPD rescue (action) plan even when symptoms are controlled or infrequent.  Ask patient to bring inhaler to all visits; assess and reinforce correct technique; address barriers to proper inhaler use, such as older age, use of multiple devices and lack of understanding.   Identify symptom triggers, such as smoking, virus, weather change, emotional upset, exercise, obesity and environmental allergen; consider reduction of work-exposure versus elimination to avoid compromising employment.  Correlate presentation to comorbidity, such as diabetes, heart failure, obstructive sleep apnea, depression and anxiety, which may worsen symptoms.  Prepare for individualized pharmacologic therapy that may include LABA (long-acting beta-2 agonist),  LAMA (long-acting muscarinic antagonist), SABA (short-acting beta-2 agonist) oral or inhaled corticosteroid.  Promote participation in pulmonary rehabilitation for breathing exercises, skills training, improved exercise capacity, mood and quality of life; address barriers to participation.  Promote physical activity or exercise to improve or maintain exercise capacity, based on tolerance that may include walking, water exercise, cycling or limb muscle strength training.  Promote use of energy conservation and activity pacing techniques.  Promote use of breathing and coughing techniques, such as inspiratory muscle training, pursed-lip breathing, diaphragmatic breathing, pranayama yoga breathing or huff cough.  Screen for malnutrition risk factors, such as unintentional weight loss and poor oral intake; refer to dietitian if identified.  Consider recommendation for oral drink supplement or multivitamin and mineral supplements if suspect inadequate oral intake or micronutrient deficiencies.   Screen for obstructive sleep apnea; prepare patient for polysomnography based on risk and presentation.  Prepare patient for use of long-term oxygen and noninvasive ventilation to relieve hypercapnia, hypoxemia, obstructive sleep apnea and reduce work of breathing.  Prepare patient with worsening disease  for surgical interventions that may include bronchoscopy, lung volume reduction surgery, bullectomy or lung transplantation.   Notes:     Problem: Symptom Exacerbation (COPD)   Priority: High  Onset Date: 05/26/2020     Goal: Symptom Exacerbation Prevented or Minimized   Start Date: 05/26/2020  Expected End Date: 12/31/2020  This Visit's Progress: On track  Priority: High  Note:   Evidence-based guidance:  Monitor for signs of respiratory infection, including changes in sputum color, volume and thickness, as well as fever.  Encourage infection prevention strategies that may include prophylactic antibiotic  therapy for patients with history of frequent exacerbations or antibiotic administration during exacerbation based on presentation, risk and benefit.  Encourage receipt of influenza and pneumococcal vaccine.  Prepare patient for use of home long-term oxygen therapy in presence of sever resting hypoxemia.  Prepare patients for laboratory studies or diagnostic exams, such as spirometry, pulse oximetry and arterial blood gas based on current symptoms, risk factors and presentation.  Assess barriers and manage adherence, including inhaler technique and persistent trigger exposure; encourage adherence, even when symptoms are controlled or infrequent.  Assess and monitor for signs/symptoms of psychosocial concerns, such as shortness of breath-anxiety cycle or depression that may impact stability of symptoms.  Identify economic resources, sociocultural beliefs, social factors and health literacy that may interfere with adherence.  Promote lifestyle changes when needed, including regular physical activity based on tolerance, weight loss, healthy eating and stress management.  Consider referral to nurse or community health worker or home-visiting program for intensive support and education (disease-management program).  Increase frequency of follow-up following exacerbation or hospitalization; consider transition of care interventions, such as hospital visit, home visit, telephone follow-up, review of discharge summary and resource referrals.   Notes:  [id number] Patient has not had any admissions for COPD    Task: Identify and Minimize Risk of COPD Exacerbation   Due Date: 12/31/2020  Note:   Care Management Activities:    - healthy lifestyle promoted - signs/symptoms of worsening disease assessed - symptom triggers identified    Notes:  [id number] Patient has been using breathing techniques Patient is using inhalers Patient is using O2 3 liters      Goals Addressed             This Visit's  Progress    (THN)Manage Fatigue (Tiredness-COPD)   On track    Timeframe:  Long-Range Goal Priority:  Medium Start [id number]                             Expected End Date:     33825053                 Follow Up Date 97673419   - develop a new routine to improve sleep - eat healthy - get outdoors every day (weather permitting) - use devices that will help like a cane, sock-puller or reacher    Why is this important?   Feeling tired or worn out is a common symptom of COPD (chronic obstructive pulmonary disease).  Learning when you feel your best and when you need rest is important.  Managing the tiredness (fatigue) will help you be active and enjoy life.     Notes:      (THN)Track and Manage My Symptoms-COPD   On track    Timeframe:  Long-Range Goal Priority:  High Start Date:     37902409  Expected End Date: 56389373                      Follow Up Date 42876811   - develop a rescue plan - eliminate symptom triggers at home - follow rescue plan if symptoms flare-up - keep follow-up appointments    Why is this important?   Tracking your symptoms and other information about your health helps your doctor plan your care.  Write down the symptoms, the time of day, what you were doing and what medicine you are taking.  You will soon learn how to manage your symptoms.     Notes:      (THN)Track and Manage My Triggers-COPD   On track    Timeframe:  Long-Range Goal Priority:  High Start Date:     57262035                        Expected End Date:       59741638                Follow Up Date [id number]   - identify and remove indoor air pollutants - limit outdoor activity during cold weather - listen for public air quality announcements every day    Why is this important?   Triggers are activities or things, like tobacco smoke or cold weather, that make your COPD (chronic obstructive pulmonary disease) flare-up.  Knowing these triggers helps you plan  how to stay away from them.  When you cannot remove them, you can learn how to manage them.     Notes:  [id number] Patient is monitoring her symptoms        Plan:  Follow-up: Patient agrees to Care Plan and Follow-up. RN sent quick tips for COPD RN will follow up within the month of October RN will send update assessment to PCP RN ordered free COVID testing [facility] [phone]

## 2020-08-05 DIAGNOSIS — L03317 Cellulitis of buttock: Secondary | ICD-10-CM | POA: Diagnosis not present

## 2020-08-05 DIAGNOSIS — J449 Chronic obstructive pulmonary disease, unspecified: Secondary | ICD-10-CM | POA: Diagnosis not present

## 2020-08-05 DIAGNOSIS — I10 Essential (primary) hypertension: Secondary | ICD-10-CM | POA: Diagnosis not present

## 2020-08-05 DIAGNOSIS — L89890 Pressure ulcer of other site, unstageable: Secondary | ICD-10-CM | POA: Diagnosis not present

## 2020-08-05 DIAGNOSIS — F3177 Bipolar disorder, in partial remission, most recent episode mixed: Secondary | ICD-10-CM | POA: Diagnosis not present

## 2020-08-05 DIAGNOSIS — L89322 Pressure ulcer of left buttock, stage 2: Secondary | ICD-10-CM | POA: Diagnosis not present

## 2020-08-11 DIAGNOSIS — L89890 Pressure ulcer of other site, unstageable: Secondary | ICD-10-CM | POA: Diagnosis not present

## 2020-08-11 DIAGNOSIS — L03317 Cellulitis of buttock: Secondary | ICD-10-CM | POA: Diagnosis not present

## 2020-08-11 DIAGNOSIS — J449 Chronic obstructive pulmonary disease, unspecified: Secondary | ICD-10-CM | POA: Diagnosis not present

## 2020-08-11 DIAGNOSIS — F3177 Bipolar disorder, in partial remission, most recent episode mixed: Secondary | ICD-10-CM | POA: Diagnosis not present

## 2020-08-11 DIAGNOSIS — I10 Essential (primary) hypertension: Secondary | ICD-10-CM | POA: Diagnosis not present

## 2020-08-11 DIAGNOSIS — L89322 Pressure ulcer of left buttock, stage 2: Secondary | ICD-10-CM | POA: Diagnosis not present

## 2020-08-12 DIAGNOSIS — K219 Gastro-esophageal reflux disease without esophagitis: Secondary | ICD-10-CM | POA: Diagnosis not present

## 2020-08-12 DIAGNOSIS — Z87891 Personal history of nicotine dependence: Secondary | ICD-10-CM | POA: Diagnosis not present

## 2020-08-12 DIAGNOSIS — Z9181 History of falling: Secondary | ICD-10-CM | POA: Diagnosis not present

## 2020-08-12 DIAGNOSIS — M47816 Spondylosis without myelopathy or radiculopathy, lumbar region: Secondary | ICD-10-CM | POA: Diagnosis not present

## 2020-08-12 DIAGNOSIS — E538 Deficiency of other specified B group vitamins: Secondary | ICD-10-CM | POA: Diagnosis not present

## 2020-08-12 DIAGNOSIS — N393 Stress incontinence (female) (male): Secondary | ICD-10-CM | POA: Diagnosis not present

## 2020-08-12 DIAGNOSIS — J449 Chronic obstructive pulmonary disease, unspecified: Secondary | ICD-10-CM | POA: Diagnosis not present

## 2020-08-12 DIAGNOSIS — L03317 Cellulitis of buttock: Secondary | ICD-10-CM | POA: Diagnosis not present

## 2020-08-12 DIAGNOSIS — Z8679 Personal history of other diseases of the circulatory system: Secondary | ICD-10-CM | POA: Diagnosis not present

## 2020-08-12 DIAGNOSIS — G4701 Insomnia due to medical condition: Secondary | ICD-10-CM | POA: Diagnosis not present

## 2020-08-12 DIAGNOSIS — F4325 Adjustment disorder with mixed disturbance of emotions and conduct: Secondary | ICD-10-CM | POA: Diagnosis not present

## 2020-08-12 DIAGNOSIS — L89890 Pressure ulcer of other site, unstageable: Secondary | ICD-10-CM | POA: Diagnosis not present

## 2020-08-12 DIAGNOSIS — Z79899 Other long term (current) drug therapy: Secondary | ICD-10-CM | POA: Diagnosis not present

## 2020-08-12 DIAGNOSIS — Z7952 Long term (current) use of systemic steroids: Secondary | ICD-10-CM | POA: Diagnosis not present

## 2020-08-12 DIAGNOSIS — I714 Abdominal aortic aneurysm, without rupture: Secondary | ICD-10-CM | POA: Diagnosis not present

## 2020-08-12 DIAGNOSIS — N8189 Other female genital prolapse: Secondary | ICD-10-CM | POA: Diagnosis not present

## 2020-08-12 DIAGNOSIS — M7071 Other bursitis of hip, right hip: Secondary | ICD-10-CM | POA: Diagnosis not present

## 2020-08-12 DIAGNOSIS — L89322 Pressure ulcer of left buttock, stage 2: Secondary | ICD-10-CM | POA: Diagnosis not present

## 2020-08-12 DIAGNOSIS — I700 Atherosclerosis of aorta: Secondary | ICD-10-CM | POA: Diagnosis not present

## 2020-08-12 DIAGNOSIS — F3177 Bipolar disorder, in partial remission, most recent episode mixed: Secondary | ICD-10-CM | POA: Diagnosis not present

## 2020-08-12 DIAGNOSIS — F419 Anxiety disorder, unspecified: Secondary | ICD-10-CM | POA: Diagnosis not present

## 2020-08-12 DIAGNOSIS — I10 Essential (primary) hypertension: Secondary | ICD-10-CM | POA: Diagnosis not present

## 2020-08-12 DIAGNOSIS — M461 Sacroiliitis, not elsewhere classified: Secondary | ICD-10-CM | POA: Diagnosis not present

## 2020-08-12 DIAGNOSIS — Z7951 Long term (current) use of inhaled steroids: Secondary | ICD-10-CM | POA: Diagnosis not present

## 2020-08-16 DIAGNOSIS — L03317 Cellulitis of buttock: Secondary | ICD-10-CM | POA: Diagnosis not present

## 2020-08-16 DIAGNOSIS — I10 Essential (primary) hypertension: Secondary | ICD-10-CM | POA: Diagnosis not present

## 2020-08-16 DIAGNOSIS — J449 Chronic obstructive pulmonary disease, unspecified: Secondary | ICD-10-CM | POA: Diagnosis not present

## 2020-08-16 DIAGNOSIS — L89322 Pressure ulcer of left buttock, stage 2: Secondary | ICD-10-CM | POA: Diagnosis not present

## 2020-08-16 DIAGNOSIS — L89890 Pressure ulcer of other site, unstageable: Secondary | ICD-10-CM | POA: Diagnosis not present

## 2020-08-16 DIAGNOSIS — F3177 Bipolar disorder, in partial remission, most recent episode mixed: Secondary | ICD-10-CM | POA: Diagnosis not present

## 2020-08-20 ENCOUNTER — Encounter: Payer: Self-pay | Admitting: Family Medicine

## 2020-08-20 ENCOUNTER — Other Ambulatory Visit: Payer: Self-pay

## 2020-08-20 ENCOUNTER — Ambulatory Visit (INDEPENDENT_AMBULATORY_CARE_PROVIDER_SITE_OTHER): Payer: Medicare Other | Admitting: Family Medicine

## 2020-08-20 VITALS — BP 109/75 | HR 68 | Temp 98.3°F | Resp 17

## 2020-08-20 DIAGNOSIS — L03317 Cellulitis of buttock: Secondary | ICD-10-CM | POA: Diagnosis not present

## 2020-08-20 DIAGNOSIS — M62838 Other muscle spasm: Secondary | ICD-10-CM | POA: Diagnosis not present

## 2020-08-20 DIAGNOSIS — B372 Candidiasis of skin and nail: Secondary | ICD-10-CM | POA: Diagnosis not present

## 2020-08-20 MED ORDER — CYCLOBENZAPRINE HCL 5 MG PO TABS: 5.0000 mg | ORAL_TABLET | Freq: Three times a day (TID) | ORAL | 1 refills | Status: DC | PRN

## 2020-08-20 MED ORDER — NYSTATIN POWD: 1.0000 "application " | Freq: Three times a day (TID) | 3 refills | Status: DC

## 2020-08-20 MED ORDER — FLUCONAZOLE 150 MG PO TABS: 150.0000 mg | ORAL_TABLET | Freq: Once | ORAL | 0 refills | Status: AC

## 2020-08-20 MED ORDER — SULFAMETHOXAZOLE-TRIMETHOPRIM 800-160 MG PO TABS: 1.0000 | ORAL_TABLET | Freq: Two times a day (BID) | ORAL | 0 refills | Status: DC

## 2020-08-20 NOTE — Progress Notes (Signed)
Acute Office Visit  Subjective:    Patient ID: Anita Krause, female    DOB: [birth year], 80 y.o.   MRN: [id number]  Chief Complaint  Patient presents with   Recurrent Skin Infections     HPI Patient is in today for skin infection on buttocks.   Patient reports she has had 3+  months of buttocks irritation which she used some leftover Lotrisone cream on. It didn't seem to help so she saw PCP last month and was treated for cellulitis with Bactrim as well as nystatin powder and fluconazole for breast yeast infection as well. She reports breasts are completely resolved, and buttocks seemed to improve briefly, but last week it started bothering her again. Reports it is painful, red, warm. She is not sure if it is yeast or the infection. Reports there is a healing pressure ulcer back there that seems about the same as before. She denies any fevers, drainage, other rashes, itching, burning. States home health has been coming out helping her and they agree that her buttocks is looking a little worse than normal.      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   Cancer ([place])    Chronic back pain 03/14/2017   Chronic bronchitis ([place]) 09/21/2016   COPD (chronic obstructive pulmonary disease) ([place])    Hypertension     Past Surgical History:  Procedure Laterality Date   ABDOMINAL HYSTERECTOMY     ANEURYSM COILING     BACK SURGERY     CARDIAC SURGERY     POLYPECTOMY     VOCAL CORD    Family History  Problem Relation Age of Onset   Hyperlipidemia Mother    Hypertension Mother    Diabetes Mother    Heart failure Mother    Hyperlipidemia Father    Hypertension Father    Heart failure Father    Cancer Sister    Healthy Brother    Healthy Brother     Social History   Socioeconomic History   Marital status: Divorced    Spouse name: Not on file    Number of children: Not on file   Years of education: Not on file   Highest education level: Not on file  Occupational History   Not on file  Tobacco Use   Smoking status: Former    Packs/day: 0.25    Types: Cigarettes   Smokeless tobacco: Never   Tobacco comments:    09/10/17  Vaping Use   Vaping Use: Never used  Substance and Sexual Activity   Alcohol use: No   Drug use: No   Sexual activity: Not Currently  Other Topics Concern   Not on file  Social History Narrative   Not on file   Social Determinants of Health   Financial Resource Strain: Not on file  Food Insecurity: No Food Insecurity   Worried About Running Out of Food in the Last Year: Never true   [place] in the Last Year: Never true  Transportation Needs: No Transportation Needs   Lack of Transportation (Medical): No   Lack of Transportation (Non-Medical): No  Physical Activity: Not on file  Stress: Not on file  Social Connections: Not on file  Intimate Partner Violence: Not on file    Outpatient Medications Prior to Visit  Medication Sig Dispense Refill  albuterol (PROVENTIL) (5 MG/ML) 0.5% nebulizer solution Take 0.5 mLs (2.5 mg total) by nebulization every 6 (six) hours as needed for wheezing or shortness of breath. 20 mL 1   albuterol (VENTOLIN HFA) 108 (90 Base) MCG/ACT inhaler Inhale 2 puffs into the lungs every 6 (six) hours as needed for wheezing or shortness of breath.     ALPRAZolam (XANAX) 0.25 MG tablet Take 0.25 mg by mouth daily as needed.     AMBULATORY NON FORMULARY MEDICATION Compression stockings to under knee, measure for fit, compression rating 10-20 mmHg. Disp: as many pairs as desired; refill x99 1 Units 99   aspirin EC 81 MG tablet Take 81 mg by mouth daily. Swallow whole.     azithromycin (ZITHROMAX) 250 MG tablet Take 250 mg by mouth daily.     benzonatate (TESSALON) 200 MG capsule Take 1 capsule (200 mg total) by mouth 3 (three) times daily as needed for cough. 90 capsule  11   carvedilol (COREG) 6.25 MG tablet Take 6.25 mg by mouth 2 (two) times daily with a meal.     cetirizine (ZYRTEC) 10 MG tablet Take 1 tablet (10 mg total) by mouth daily. 90 tablet 3   Cholecalciferol 25 MCG (1000 UT) tablet Take 1,000 Units by mouth daily.     clotrimazole (LOTRIMIN) 1 % cream Apply topically 2 (two) times daily. 45 g 1   furosemide (LASIX) 20 MG tablet Take 1 tablet (20 mg total) by mouth daily. 90 tablet 1   hydrOXYzine (ATARAX/VISTARIL) 25 MG tablet Take 1 tablet (25 mg total) by mouth 2 (two) times daily as needed for anxiety. 30 tablet 5   meloxicam (MOBIC) 7.5 MG tablet Take 1 tablet (7.5 mg total) po one or twice daily as needed for aches/pains 90 tablet 1   metoprolol succinate (TOPROL-XL) 25 MG 24 hr tablet Take 25 mg by mouth daily.     montelukast (SINGULAIR) 10 MG tablet Take 10 mg by mouth at bedtime.     omeprazole (PRILOSEC) 40 MG capsule Take 1 capsule (40 mg total) by mouth daily. 90 capsule 3   potassium chloride SA (KLOR-CON) 20 MEQ tablet Take 1 tablet (20 mEq total) by mouth daily. 90 tablet 3   predniSONE (DELTASONE) 10 MG tablet Take by mouth.     risperiDONE (RISPERDAL) 1 MG tablet Take 1 mg by mouth at bedtime.     rosuvastatin (CRESTOR) 40 MG tablet Take 40 mg by mouth daily.     SYMBICORT 80-4.5 MCG/ACT inhaler Inhale into the lungs.     triamterene-hydrochlorothiazide (DYAZIDE) 37.5-25 MG capsule TAKE 1 CAPSULE BY MOUTH TWICE DAILY FOR HIGH BLOOD PRESSURE 180 capsule 1   clopidogrel (PLAVIX) 75 MG tablet Take 75 mg by mouth daily.     cyclobenzaprine (FLEXERIL) 5 MG tablet Take 1-2 tablets (5-10 mg total) by mouth 3 (three) times daily as needed for muscle spasms. 90 tablet 1   Nystatin POWD Place 1 application onto the skin in the morning, at noon, and at bedtime. 1 each 3   sulfamethoxazole-trimethoprim (BACTRIM DS) 800-160 MG tablet Take 1 tablet by mouth 2 (two) times daily. 10 tablet 0   No facility-administered medications prior to visit.     Allergies  Allergen Reactions   Erythromycin Anaphylaxis    Throat closes up, ling in mouth peals   Nitrofurantoin Macrocrystal Anaphylaxis    Pt states her throat closes up   Penicillins Anaphylaxis   Colesevelam Hcl Other (See Comments)   Doxycycline Rash  Lamotrigine Rash    Review of Systems All review of systems negative except what is listed in the HPI     Objective:    Physical Exam Vitals reviewed.  Constitutional:      Appearance: Normal appearance.  Cardiovascular:     Rate and Rhythm: Regular rhythm.     Heart sounds: Normal heart sounds.  Pulmonary:     Breath sounds: Normal breath sounds.  Skin:    General: Skin is warm.     Findings: Erythema and lesion present.     Comments: See picture  Neurological:     Mental Status: She is alert and oriented to person, place, and time. Mental status is at baseline.  Psychiatric:        Mood and Affect: Mood normal.        Behavior: Behavior normal.        Thought Content: Thought content normal.        Judgment: Judgment normal.         BP 109/75   Pulse 68   Temp 98.3 F (36.8 C)   Resp 17   SpO2 97%  Wt Readings from Last 3 Encounters:  07/07/20 172 lb (78 kg)  04/21/20 159 lb (72.1 kg)  12/22/19 163 lb 9.6 oz (74.2 kg)    Health Maintenance Due  Topic Date Due   Zoster Vaccines- Shingrix (2 of 2) 12/25/2018   COVID-19 Vaccine (3 - Pfizer risk series) 04/20/2019   INFLUENZA VACCINE  08/02/2020    There are no preventive care reminders to display for this patient.   Lab Results  Component Value Date   TSH 2.48 09/29/2019   Lab Results  Component Value Date   WBC 5.7 09/29/2019   HGB 13.9 09/29/2019   HCT 41.3 09/29/2019   MCV 87.1 09/29/2019   PLT 156 09/29/2019   Lab Results  Component Value Date   NA 141 11/26/2019   K 3.7 11/26/2019   CO2 34 (H) 11/26/2019   GLUCOSE 146 (H) 11/26/2019   BUN 15 11/26/2019   CREATININE 0.85 11/26/2019   BILITOT 0.4 11/26/2019   ALKPHOS  53 08/28/2016   AST 12 11/26/2019   ALT 8 11/26/2019   PROT 5.8 (L) 11/26/2019   ALBUMIN 4.3 08/28/2016   CALCIUM 9.4 11/26/2019   ANIONGAP 12 06/28/2018   Lab Results  Component Value Date   CHOL 140 09/29/2019   Lab Results  Component Value Date   HDL 59 09/29/2019   Lab Results  Component Value Date   LDLCALC 57 09/29/2019   Lab Results  Component Value Date   TRIG 163 (H) 09/29/2019   Lab Results  Component Value Date   CHOLHDL 2.4 09/29/2019   Lab Results  Component Value Date   HGBA1C 6.2 (H) 11/26/2019       Assessment & Plan:   1. Muscle spasm Refill requested. Tolerating well. - cyclobenzaprine (FLEXERIL) 5 MG tablet; Take 1-2 tablets (5-10 mg total) by mouth 3 (three) times daily as needed for muscle spasms.  Dispense: 90 tablet; Refill: 1  2. Cellulitis of buttock Given return of erythema, warmth, pain will give another course of Bactrim. She is allergic to the better options. If continues to recur, may need to consider wound consult to reassess the ulcer. Encouraged good hygiene and off-loading of buttocks.  - sulfamethoxazole-trimethoprim (BACTRIM DS) 800-160 MG tablet; Take 1 tablet by mouth 2 (two) times daily.  Dispense: 10 tablet; Refill: 0  3. Skin yeast infection  Patient reports she is prone to yeast infections. Requesting refill of nystatin powder and another course of Diflucan in case ABX worsen yeast.  - Nystatin POWD; Place 1 application onto the skin in the morning, at noon, and at bedtime.  Dispense: 1 each; Refill: 3 - fluconazole (DIFLUCAN) 150 MG tablet; Take 1 tablet (150 mg total) by mouth once for 1 dose. Repeat in 72 hours if needed  Dispense: 2 tablet; Refill: 0  Patient aware of signs/symptoms requiring further/urgent evaluation.  Follow-up as needed, if symptoms worsen or fail to improve.   Purcell Nails Olevia Bowens, DNP, FNP-C

## 2020-08-24 DIAGNOSIS — L89322 Pressure ulcer of left buttock, stage 2: Secondary | ICD-10-CM | POA: Diagnosis not present

## 2020-08-24 DIAGNOSIS — I10 Essential (primary) hypertension: Secondary | ICD-10-CM | POA: Diagnosis not present

## 2020-08-24 DIAGNOSIS — J449 Chronic obstructive pulmonary disease, unspecified: Secondary | ICD-10-CM | POA: Diagnosis not present

## 2020-08-24 DIAGNOSIS — L03317 Cellulitis of buttock: Secondary | ICD-10-CM | POA: Diagnosis not present

## 2020-08-24 DIAGNOSIS — F3177 Bipolar disorder, in partial remission, most recent episode mixed: Secondary | ICD-10-CM | POA: Diagnosis not present

## 2020-08-24 DIAGNOSIS — L89890 Pressure ulcer of other site, unstageable: Secondary | ICD-10-CM | POA: Diagnosis not present

## 2020-08-26 DIAGNOSIS — F17200 Nicotine dependence, unspecified, uncomplicated: Secondary | ICD-10-CM | POA: Diagnosis not present

## 2020-08-26 DIAGNOSIS — C3411 Malignant neoplasm of upper lobe, right bronchus or lung: Secondary | ICD-10-CM | POA: Diagnosis not present

## 2020-08-26 DIAGNOSIS — J449 Chronic obstructive pulmonary disease, unspecified: Secondary | ICD-10-CM | POA: Diagnosis not present

## 2020-08-26 DIAGNOSIS — J9611 Chronic respiratory failure with hypoxia: Secondary | ICD-10-CM | POA: Diagnosis not present

## 2020-08-27 ENCOUNTER — Ambulatory Visit: Payer: Medicare Other | Admitting: *Deleted

## 2020-08-30 DIAGNOSIS — M5416 Radiculopathy, lumbar region: Secondary | ICD-10-CM | POA: Diagnosis not present

## 2020-08-30 DIAGNOSIS — M7061 Trochanteric bursitis, right hip: Secondary | ICD-10-CM | POA: Diagnosis not present

## 2020-08-30 DIAGNOSIS — M47816 Spondylosis without myelopathy or radiculopathy, lumbar region: Secondary | ICD-10-CM | POA: Diagnosis not present

## 2020-08-30 DIAGNOSIS — M961 Postlaminectomy syndrome, not elsewhere classified: Secondary | ICD-10-CM | POA: Diagnosis not present

## 2020-08-31 DIAGNOSIS — L03317 Cellulitis of buttock: Secondary | ICD-10-CM | POA: Diagnosis not present

## 2020-08-31 DIAGNOSIS — F3177 Bipolar disorder, in partial remission, most recent episode mixed: Secondary | ICD-10-CM | POA: Diagnosis not present

## 2020-08-31 DIAGNOSIS — J449 Chronic obstructive pulmonary disease, unspecified: Secondary | ICD-10-CM | POA: Diagnosis not present

## 2020-08-31 DIAGNOSIS — L89322 Pressure ulcer of left buttock, stage 2: Secondary | ICD-10-CM | POA: Diagnosis not present

## 2020-08-31 DIAGNOSIS — I10 Essential (primary) hypertension: Secondary | ICD-10-CM | POA: Diagnosis not present

## 2020-08-31 DIAGNOSIS — L89890 Pressure ulcer of other site, unstageable: Secondary | ICD-10-CM | POA: Diagnosis not present

## 2020-09-01 ENCOUNTER — Other Ambulatory Visit: Payer: Self-pay

## 2020-09-01 DIAGNOSIS — I714 Abdominal aortic aneurysm, without rupture, unspecified: Secondary | ICD-10-CM

## 2020-09-07 DIAGNOSIS — I10 Essential (primary) hypertension: Secondary | ICD-10-CM | POA: Diagnosis not present

## 2020-09-07 DIAGNOSIS — L89890 Pressure ulcer of other site, unstageable: Secondary | ICD-10-CM | POA: Diagnosis not present

## 2020-09-07 DIAGNOSIS — J449 Chronic obstructive pulmonary disease, unspecified: Secondary | ICD-10-CM | POA: Diagnosis not present

## 2020-09-07 DIAGNOSIS — F3177 Bipolar disorder, in partial remission, most recent episode mixed: Secondary | ICD-10-CM | POA: Diagnosis not present

## 2020-09-07 DIAGNOSIS — L89322 Pressure ulcer of left buttock, stage 2: Secondary | ICD-10-CM | POA: Diagnosis not present

## 2020-09-07 DIAGNOSIS — L03317 Cellulitis of buttock: Secondary | ICD-10-CM | POA: Diagnosis not present

## 2020-09-09 ENCOUNTER — Other Ambulatory Visit: Payer: Self-pay

## 2020-09-09 ENCOUNTER — Ambulatory Visit (INDEPENDENT_AMBULATORY_CARE_PROVIDER_SITE_OTHER): Payer: Medicare Other | Admitting: Physician Assistant

## 2020-09-09 ENCOUNTER — Ambulatory Visit (HOSPITAL_COMMUNITY)
Admission: RE | Admit: 2020-09-09 | Discharge: 2020-09-09 | Disposition: A | Discharge status: Home / Self Care | Payer: Medicare Other | Source: Ambulatory Visit | Attending: Vascular Surgery | Admitting: Vascular Surgery

## 2020-09-09 VITALS — BP 121/73 | HR 81 | Temp 96.8°F | Resp 16 | Ht 64.0 in | Wt 180.0 lb

## 2020-09-09 DIAGNOSIS — I714 Abdominal aortic aneurysm, without rupture, unspecified: Secondary | ICD-10-CM

## 2020-09-09 NOTE — Progress Notes (Signed)
HISTORY AND PHYSICAL     CC:  follow up. Requesting Provider:  Emeterio Reeve, DO  HPI: This is a 79 y.o. female who is here today for follow up for AAA.  Pt was seen by Dr. Donnetta Hutching in 2019 after incidental finding of AAA on films for lumbar workup.  At that time it measured about 3.5cm.    The pt returns today for follow up.  She has not been back to see [place] since 2019.  She states that during that time, she was diagnosed with lung cancer and underwent radiation.  She states that she also had a heart attack and had a stent placed.  She is on home O2.  She has bilateral leg swelling and is on a water pill.  She is followed by a cardiologist and pulmonologist.  She states that she is still smoking some.  She is here with her sister.  They tell me she has a sore on the back of the left ankle.  She wore a pair of shoes about 8 months ago that caused it.  They both say it has gotten better but still present.    The pt is on a statin for cholesterol management.    The pt is on an aspirin.    Other AC:  none The pt is on BB for hypertension.  The pt does not have diabetes. Tobacco hx:  current    Past Medical History:  Diagnosis Date   AAA (abdominal aortic aneurysm) without rupture ([place]) 05/03/2017   3.4 cm May 2019   Bipolar 1 disorder ([place])    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   Cancer ([place])    Chronic back pain 03/14/2017   Chronic bronchitis ([place]) 09/21/2016   COPD (chronic obstructive pulmonary disease) ([place])    Hypertension     Past Surgical History:  Procedure Laterality Date   ABDOMINAL HYSTERECTOMY     ANEURYSM COILING     BACK SURGERY     CARDIAC SURGERY     POLYPECTOMY     VOCAL CORD    Allergies  Allergen Reactions   Erythromycin Anaphylaxis    Throat closes up, ling in mouth peals   Nitrofurantoin Macrocrystal Anaphylaxis    Pt states her throat closes up   Penicillins Anaphylaxis   Colesevelam Hcl Other (See Comments)   Doxycycline Rash   Lamotrigine Rash     Current Outpatient Medications  Medication Sig Dispense Refill   albuterol (PROVENTIL) (5 MG/ML) 0.5% nebulizer solution Take 0.5 mLs (2.5 mg total) by nebulization every 6 (six) hours as needed for wheezing or shortness of breath. 20 mL 1   albuterol (VENTOLIN HFA) 108 (90 Base) MCG/ACT inhaler Inhale 2 puffs into the lungs every 6 (six) hours as needed for wheezing or shortness of breath.     ALPRAZolam (XANAX) 0.25 MG tablet Take 0.25 mg by mouth daily as needed.     AMBULATORY NON FORMULARY MEDICATION Compression stockings to under knee, measure for fit, compression rating 10-20 mmHg. Disp: as many pairs as desired; refill x99 1 Units 99   aspirin EC 81 MG tablet Take 81 mg by mouth daily. Swallow whole.     azithromycin (ZITHROMAX) 250 MG tablet Take 250 mg by mouth daily.     benzonatate (TESSALON) 200 MG capsule Take 1 capsule (200 mg total) by mouth 3 (three) times daily as needed for cough. 90 capsule 11   carvedilol (COREG) 6.25 MG tablet Take 6.25 mg by mouth 2 (two) times daily  with a meal.     cetirizine (ZYRTEC) 10 MG tablet Take 1 tablet (10 mg total) by mouth daily. 90 tablet 3   Cholecalciferol 25 MCG (1000 UT) tablet Take 1,000 Units by mouth daily.     clotrimazole (LOTRIMIN) 1 % cream Apply topically 2 (two) times daily. 45 g 1   cyclobenzaprine (FLEXERIL) 5 MG tablet Take 1-2 tablets (5-10 mg total) by mouth 3 (three) times daily as needed for muscle spasms. 90 tablet 1   furosemide (LASIX) 20 MG tablet Take 1 tablet (20 mg total) by mouth daily. 90 tablet 1   hydrOXYzine (ATARAX/VISTARIL) 25 MG tablet Take 1 tablet (25 mg total) by mouth 2 (two) times daily as needed for anxiety. 30 tablet 5   meloxicam (MOBIC) 7.5 MG tablet Take 1 tablet (7.5 mg total) po one or twice daily as needed for aches/pains 90 tablet 1   metoprolol succinate (TOPROL-XL) 25 MG 24 hr tablet Take 25 mg by mouth daily.     montelukast (SINGULAIR) 10 MG tablet Take 10 mg by mouth at bedtime.      Nystatin POWD Place 1 application onto the skin in the morning, at noon, and at bedtime. 1 each 3   omeprazole (PRILOSEC) 40 MG capsule Take 1 capsule (40 mg total) by mouth daily. 90 capsule 3   potassium chloride SA (KLOR-CON) 20 MEQ tablet Take 1 tablet (20 mEq total) by mouth daily. 90 tablet 3   predniSONE (DELTASONE) 10 MG tablet Take by mouth.     risperiDONE (RISPERDAL) 1 MG tablet Take 1 mg by mouth at bedtime.     rosuvastatin (CRESTOR) 40 MG tablet Take 40 mg by mouth daily.     sulfamethoxazole-trimethoprim (BACTRIM DS) 800-160 MG tablet Take 1 tablet by mouth 2 (two) times daily. 10 tablet 0   SYMBICORT 80-4.5 MCG/ACT inhaler Inhale into the lungs.     triamterene-hydrochlorothiazide (DYAZIDE) 37.5-25 MG capsule TAKE 1 CAPSULE BY MOUTH TWICE DAILY FOR HIGH BLOOD PRESSURE 180 capsule 1   No current facility-administered medications for this visit.    Family History  Problem Relation Age of Onset   Hyperlipidemia Mother    Hypertension Mother    Diabetes Mother    Heart failure Mother    Hyperlipidemia Father    Hypertension Father    Heart failure Father    Cancer Sister    Healthy Brother    Healthy Brother     Social History   Socioeconomic History   Marital status: Divorced    Spouse name: Not on file   Number of children: Not on file   Years of education: Not on file   Highest education level: Not on file  Occupational History   Not on file  Tobacco Use   Smoking status: Former    Packs/day: 0.25    Types: Cigarettes   Smokeless tobacco: Never   Tobacco comments:    09/10/17  Vaping Use   Vaping Use: Never used  Substance and Sexual Activity   Alcohol use: No   Drug use: No   Sexual activity: Not Currently  Other Topics Concern   Not on file  Social History Narrative   Not on file   Social Determinants of Health   Financial Resource Strain: Not on file  Food Insecurity: No Food Insecurity   Worried About Running Out of Food in the Last Year:  Never true   [place] in the Last Year: Never true  Transportation Needs:  No Transportation Needs   Lack of Transportation (Medical): No   Lack of Transportation (Non-Medical): No  Physical Activity: Not on file  Stress: Not on file  Social Connections: Not on file  Intimate Partner Violence: Not on file     REVIEW OF SYSTEMS:   [X]  denotes positive finding, [ ]  denotes negative finding Cardiac  Comments:  Chest pain or chest pressure:    Shortness of breath upon exertion: x   Short of breath when lying flat:    Irregular heart rhythm:        Vascular    Pain in calf, thigh, or hip brought on by ambulation:    Pain in feet at night that wakes you up from your sleep:     Blood clot in your veins:    Leg swelling:         Pulmonary    Oxygen at home: x   Productive cough:     Wheezing:         Neurologic    Sudden weakness in arms or legs:     Sudden numbness in arms or legs:     Sudden onset of difficulty speaking or slurred speech:    Temporary loss of vision in one eye:     Problems with dizziness:         Gastrointestinal    Blood in stool:     Vomited blood:         Genitourinary    Burning when urinating:     Blood in urine:        Psychiatric    Major depression:         Hematologic    Bleeding problems:    Problems with blood clotting too easily:        Skin    Rashes or ulcers: x Callus on left heel/achilles      Constitutional    Fever or chills:      PHYSICAL EXAMINATION:  Today's Vitals   09/09/20 0942  BP: 121/73  Pulse: 81  Resp: 16  Temp: (!) 96.8 F (36 C)  TempSrc: Temporal  SpO2: 97%  Weight: 180 lb (81.6 kg)  Height: 5\' 4"  (1.626 m)   Body mass index is 30.9 kg/m.   General:  WDWN in NAD; vital signs documented above Gait: Not observed-in wheelchair HENT: WNL, normocephalic Pulmonary: normal non-labored breathing , without wheezing Cardiac: regular HR, without  Murmur; without carotid bruits Abdomen: soft, NT,  no masses; aortic pulse is not palpable Skin: without rashes Vascular Exam/Pulses:  Right Left  Radial 2+ (normal) 2+ (normal)  Popliteal Unable to palpate Sjrpal  DP 2+ (normal) 2+ (normal)  PT Unable to palpate Unable to palpate   Extremities: without ischemic changes, without Gangrene , without cellulitis; without open wounds; small callus on posterior portion of heel/achilles.  No evidence of drainage or infection.  Mild BLE edema. Musculoskeletal: no muscle wasting or atrophy  Neurologic: A&O X 3;  No focal weakness or paresthesias are detected Psychiatric:  The pt has Normal affect.   Non-Invasive Vascular Imaging:   AAA Arterial duplex on 09/09/2020: Abdominal Aorta Findings:  +-----------+-------+----------+----------+--------+--------+--------+  Location   AP (cm)Trans (cm)PSV (cm/s)WaveformThrombusComments  +-----------+-------+----------+----------+--------+--------+--------+  Proximal   2.01   2.04      65                                  +-----------+-------+----------+----------+--------+--------+--------+  Mid        3.40   3.44      55                                  +-----------+-------+----------+----------+--------+--------+--------+  Distal     2.38   2.33      172                                 +-----------+-------+----------+----------+--------+--------+--------+  RT CIA Prox1.7    1.5       172                                 +-----------+-------+----------+----------+--------+--------+--------+  LT CIA Prox1.5    1.8       179                                 +-----------+-------+----------+----------+--------+--------+--------+    Summary:  Abdominal Aorta: There is evidence of abnormal dilatation of the mid  Abdominal aorta. The largest aortic measurement is 3.4 cm. The largest aortic diameter remains essentially unchanged compared to prior exam.  Previous diameter measurement was 3.6 cm  obtained on  09/13/2017.   Previous AAA arterial duplex on 09/13/2017: Abdominal Aorta Findings:  +-----------+-------+----------+----------+--------+--------+--------+  Location   AP (cm)Trans (cm)PSV (cm/s)WaveformThrombusComments  +-----------+-------+----------+----------+--------+--------+--------+  Proximal   1.40   1.10      71        biphasic                  +-----------+-------+----------+----------+--------+--------+--------+  Mid        3.40   3.60      58        biphasic                  +-----------+-------+----------+----------+--------+--------+--------+  Distal     2.30   2.36      26        biphasic                  +-----------+-------+----------+----------+--------+--------+--------+  RT CIA Prox1.5    1.2       121       biphasic                  +-----------+-------+----------+----------+--------+--------+--------+  LT CIA Prox0.9    0.9       90        biphasic                  +-----------+-------+----------+----------+--------+--------+--------+    ASSESSMENT/PLAN:: 80 y.o. female here for follow up for AAA  AAA -aorta essentially unchanged  and no symptoms from aneurysm.   -pt will f/u in one year with AAA duplex. -discussed that risk of rupture is low.  If she develops sudden severe onset of abdominal or back pain, she knows to call 911.  -continue asa/statin  Current smoker -discussed importance of smoking cessation.  Left heel callus -callused area over left posterior heel/achilles area.  No drainage, no evidence of infection.  She has palpable DP pulses.  I have added an ABI to her duplex in one year.  If this sore worsens, she will call us sooner.    Leontine Locket, [facility] [phone]  Clinic MD:   Scot Dock

## 2020-09-15 DIAGNOSIS — Z20822 Contact with and (suspected) exposure to covid-19: Secondary | ICD-10-CM | POA: Diagnosis not present

## 2020-09-16 ENCOUNTER — Telehealth: Payer: Self-pay | Admitting: Pharmacist

## 2020-09-16 ENCOUNTER — Telehealth: Payer: Medicare Other

## 2020-09-16 NOTE — Telephone Encounter (Signed)
  Care Management   Follow Up Note   09/16/2020 Name: Anita Krause MRN: [id number] DOB: [birth year]   Referred by: Emeterio Reeve, DO   Successful contact was made with the patient to discuss care management and care coordination services. Patient declines engagement at this time as she is leaving our practice due to departure of PCP.   Follow Up Plan: CCM enrollment status changed to "previously enrolled" as per patient request on 06/18/20 to discontinue enrollment. Case closed to case management services in primary care home.   Darius Bump

## 2020-09-21 DIAGNOSIS — Z20822 Contact with and (suspected) exposure to covid-19: Secondary | ICD-10-CM | POA: Diagnosis not present

## 2020-09-22 ENCOUNTER — Other Ambulatory Visit (HOSPITAL_COMMUNITY): Payer: Medicare Other

## 2020-09-22 ENCOUNTER — Ambulatory Visit: Payer: Medicare Other

## 2020-10-04 DIAGNOSIS — Z7689 Persons encountering health services in other specified circumstances: Secondary | ICD-10-CM | POA: Diagnosis not present

## 2020-10-04 DIAGNOSIS — Z780 Asymptomatic menopausal state: Secondary | ICD-10-CM | POA: Diagnosis not present

## 2020-10-04 DIAGNOSIS — J449 Chronic obstructive pulmonary disease, unspecified: Secondary | ICD-10-CM | POA: Diagnosis not present

## 2020-10-04 DIAGNOSIS — Z23 Encounter for immunization: Secondary | ICD-10-CM | POA: Diagnosis not present

## 2020-10-25 DIAGNOSIS — Z20822 Contact with and (suspected) exposure to covid-19: Secondary | ICD-10-CM | POA: Diagnosis not present

## 2020-10-26 ENCOUNTER — Other Ambulatory Visit: Payer: Self-pay | Admitting: *Deleted

## 2020-10-26 NOTE — Patient Instructions (Signed)
Goals Addressed             This Visit's Progress    (THN)Manage Fatigue (Tiredness-COPD)   On track    Timeframe:  Long-Range Goal Priority:  Medium Start [id number]                             Expected End Date:     29191660                Follow Up Date [id number] - develop a new routine to improve sleep - eat healthy - get outdoors every day (weather permitting) - use devices that will help like a cane, sock-puller or reacher    Why is this important?   Feeling tired or worn out is a common symptom of COPD (chronic obstructive pulmonary disease).  Learning when you feel your best and when you need rest is important.  Managing the tiredness (fatigue) will help you be active and enjoy life.     Notes:  [id number] Patient rests as needed to manage excessive fatigue     (THN)Track and Manage My Symptoms-COPD   On track    Timeframe:  Long-Range Goal Priority:  High Start Date:     32023343                        Expected End Date: [id number]         Follow Up Date [id number]   - develop a rescue plan - eliminate symptom triggers at home - follow rescue plan if symptoms flare-up - keep follow-up appointments    Why is this important?   Tracking your symptoms and other information about your health helps your doctor plan your care.  Write down the symptoms, the time of day, what you were doing and what medicine you are taking.  You will soon learn how to manage your symptoms.     Notes:  [id number] Patient is monitoring symptoms and following through with health maintenance to prevent COPD exacerbation     (THN)Track and Manage My Triggers-COPD   On track    Timeframe:  Long-Range Goal Priority:  High Start Date:     33612244                        Expected End Date:      97530051           Follow Up Date [id number]  - identify and remove indoor air pollutants - limit outdoor activity during cold weather - listen for public air quality announcements every day    Why is  this important?   Triggers are activities or things, like tobacco smoke or cold weather, that make your COPD (chronic obstructive pulmonary disease) flare-up.  Knowing these triggers helps you plan how to stay away from them.  When you cannot remove them, you can learn how to manage them.     Notes:  [id number] Patient is monitoring her symptoms [id number] Patient is monitoring symptoms and taking medications as per ordered

## 2020-10-26 NOTE — Patient Outreach (Signed)
[facility]) Care Management  [place]  10/26/2020   Anita Krause 02-Dec-1940 [id number]  RN Health Coach telephone call to patient.  Hip[employer] compliance verified. Per patient she is feeling better. She has not had any COPD exacerbations.  Per patient she is taking her medications as per ordered. Patient has not received the smart vest that was ordered. Patient had not received her COVID vaccine. Patient has agreed to further outreach calls.   Encounter Medications:  Outpatient Encounter Medications as of 10/26/2020  Medication Sig   albuterol (PROVENTIL) (5 MG/ML) 0.5% nebulizer solution Take 0.5 mLs (2.5 mg total) by nebulization every 6 (six) hours as needed for wheezing or shortness of breath.   albuterol (VENTOLIN HFA) 108 (90 Base) MCG/ACT inhaler Inhale 2 puffs into the lungs every 6 (six) hours as needed for wheezing or shortness of breath.   ALPRAZolam (XANAX) 0.25 MG tablet Take 0.25 mg by mouth daily as needed.   AMBULATORY NON FORMULARY MEDICATION Compression stockings to under knee, measure for fit, compression rating 10-20 mmHg. Disp: as many pairs as desired; refill x99   aspirin EC 81 MG tablet Take 81 mg by mouth daily. Swallow whole.   azithromycin (ZITHROMAX) 250 MG tablet Take 250 mg by mouth daily.   benzonatate (TESSALON) 200 MG capsule Take 1 capsule (200 mg total) by mouth 3 (three) times daily as needed for cough.   carvedilol (COREG) 6.25 MG tablet Take 6.25 mg by mouth 2 (two) times daily with a meal.   cetirizine (ZYRTEC) 10 MG tablet Take 1 tablet (10 mg total) by mouth daily.   Cholecalciferol 25 MCG (1000 UT) tablet Take 1,000 Units by mouth daily.   clotrimazole (LOTRIMIN) 1 % cream Apply topically 2 (two) times daily.   cyclobenzaprine (FLEXERIL) 5 MG tablet Take 1-2 tablets (5-10 mg total) by mouth 3 (three) times daily as needed for muscle spasms.   furosemide (LASIX) 20 MG tablet Take 1 tablet (20 mg total) by mouth daily.    hydrOXYzine (ATARAX/VISTARIL) 25 MG tablet Take 1 tablet (25 mg total) by mouth 2 (two) times daily as needed for anxiety.   meloxicam (MOBIC) 7.5 MG tablet Take 1 tablet (7.5 mg total) po one or twice daily as needed for aches/pains   metoprolol succinate (TOPROL-XL) 25 MG 24 hr tablet Take 25 mg by mouth daily.   montelukast (SINGULAIR) 10 MG tablet Take 10 mg by mouth at bedtime.   Nystatin POWD Place 1 application onto the skin in the morning, at noon, and at bedtime.   omeprazole (PRILOSEC) 40 MG capsule Take 1 capsule (40 mg total) by mouth daily.   potassium chloride SA (KLOR-CON) 20 MEQ tablet Take 1 tablet (20 mEq total) by mouth daily.   predniSONE (DELTASONE) 10 MG tablet Take by mouth.   risperiDONE (RISPERDAL) 1 MG tablet Take 1 mg by mouth at bedtime.   rosuvastatin (CRESTOR) 40 MG tablet Take 40 mg by mouth daily.   sulfamethoxazole-trimethoprim (BACTRIM DS) 800-160 MG tablet Take 1 tablet by mouth 2 (two) times daily.   SYMBICORT 80-4.5 MCG/ACT inhaler Inhale into the lungs.   triamterene-hydrochlorothiazide (DYAZIDE) 37.5-25 MG capsule TAKE 1 CAPSULE BY MOUTH TWICE DAILY FOR HIGH BLOOD PRESSURE   No facility-administered encounter medications on file as of 10/26/2020.    Functional Status:  No flowsheet data found.  Fall/Depression Screening: Fall Risk  09/04/2019 07/31/2018  Falls in the past year? 0 0  Comment - Emmi Telephone Survey: data to providers  prior to load  Number falls in past yr: 0 -  Injury with Fall? 0 -  Follow up Falls evaluation completed -   PHQ 2/9 Scores 05/26/2020 09/04/2019 09/04/2019 08/26/2018 09/10/2017 09/21/2016  PHQ - 2 Score 0 0 0 6 0 3  PHQ- 9 Score - - - 14 12 20    Care Plan Care Plan : COPD (Adult)  Updates made by Verlin Grills, RN since 10/26/2020 12:00 AM     Problem: Disease Progression (COPD)   Priority: High  Onset Date: 05/26/2020     Long-Range Goal: Disease Progression Minimized or Managed   Start Date: 05/26/2020   Expected End Date: 12/31/2021  This Visit's Progress: On track  Recent Progress: On track  Priority: High  Note:   Evidence-based guidance:  Identify current smoking/tobacco use; provide smoking cessation intervention.  Assess symptom control by the frequency and type of symptoms, reliever use and activity limitation at every encounter.  Assess risk for exacerbation (flare up) by evaluating spirometry, pulse oximetry, reliever use, presentation of symptoms and activity limitation; anticipate treatment adjustment based on risks and resources.  Develop and/or review and reinforce use of COPD rescue (action) plan even when symptoms are controlled or infrequent.  Ask patient to bring inhaler to all visits; assess and reinforce correct technique; address barriers to proper inhaler use, such as older age, use of multiple devices and lack of understanding.   Identify symptom triggers, such as smoking, virus, weather change, emotional upset, exercise, obesity and environmental allergen; consider reduction of work-exposure versus elimination to avoid compromising employment.  Correlate presentation to comorbidity, such as diabetes, heart failure, obstructive sleep apnea, depression and anxiety, which may worsen symptoms.  Prepare for individualized pharmacologic therapy that may include LABA (long-acting beta-2 agonist), LAMA (long-acting muscarinic antagonist), SABA (short-acting beta-2 agonist) oral or inhaled corticosteroid.  Promote participation in pulmonary rehabilitation for breathing exercises, skills training, improved exercise capacity, mood and quality of life; address barriers to participation.  Promote physical activity or exercise to improve or maintain exercise capacity, based on tolerance that may include walking, water exercise, cycling or limb muscle strength training.  Promote use of energy conservation and activity pacing techniques.  Promote use of breathing and coughing techniques,  such as inspiratory muscle training, pursed-lip breathing, diaphragmatic breathing, pranayama yoga breathing or huff cough.  Screen for malnutrition risk factors, such as unintentional weight loss and poor oral intake; refer to dietitian if identified.  Consider recommendation for oral drink supplement or multivitamin and mineral supplements if suspect inadequate oral intake or micronutrient deficiencies.   Screen for obstructive sleep apnea; prepare patient for polysomnography based on risk and presentation.  Prepare patient for use of long-term oxygen and noninvasive ventilation to relieve hypercapnia, hypoxemia, obstructive sleep apnea and reduce work of breathing.  Prepare patient with worsening disease for surgical interventions that may include bronchoscopy, lung volume reduction surgery, bullectomy or lung transplantation.   Notes:  [id number] Patient is following through with health maintenance Flu vaccine and scheduling COVID vaccine    Task: Alleviate Barriers to COPD Management   Due Date: 12/31/2021  Note:   Care Management Activities:    - activity or exercise based on tolerance encouraged - breathing techniques encouraged - communicable disease prevention promoted - individualized medical nutrition therapy provided - medication-adherence assessment completed - rescue (action) plan reviewed - self-awareness of symptom triggers encouraged    Notes:     Problem: Symptom Exacerbation (COPD)   Priority: High  Onset Date: 05/26/2020  Goal: Symptom Exacerbation Prevented or Minimized   Start Date: 05/26/2020  Expected End Date: 12/31/2021  This Visit's Progress: On track  Recent Progress: On track  Priority: High  Note:   Evidence-based guidance:  Monitor for signs of respiratory infection, including changes in sputum color, volume and thickness, as well as fever.  Encourage infection prevention strategies that may include prophylactic antibiotic therapy for patients  with history of frequent exacerbations or antibiotic administration during exacerbation based on presentation, risk and benefit.  Encourage receipt of influenza and pneumococcal vaccine.  Prepare patient for use of home long-term oxygen therapy in presence of sever resting hypoxemia.  Prepare patients for laboratory studies or diagnostic exams, such as spirometry, pulse oximetry and arterial blood gas based on current symptoms, risk factors and presentation.  Assess barriers and manage adherence, including inhaler technique and persistent trigger exposure; encourage adherence, even when symptoms are controlled or infrequent.  Assess and monitor for signs/symptoms of psychosocial concerns, such as shortness of breath-anxiety cycle or depression that may impact stability of symptoms.  Identify economic resources, sociocultural beliefs, social factors and health literacy that may interfere with adherence.  Promote lifestyle changes when needed, including regular physical activity based on tolerance, weight loss, healthy eating and stress management.  Consider referral to nurse or community health worker or home-visiting program for intensive support and education (disease-management program).  Increase frequency of follow-up following exacerbation or hospitalization; consider transition of care interventions, such as hospital visit, home visit, telephone follow-up, review of discharge summary and resource referrals.   Notes:  [id number] Patient has not had any admissions for COPD [id number] No admissions for COPD exacerbation    Task: Identify and Minimize Risk of COPD Exacerbation   Due Date: 12/31/2021  Note:   Care Management Activities:    - healthy lifestyle promoted - signs/symptoms of worsening disease assessed - symptom triggers identified    Notes:  [id number] Patient has been using breathing techniques Patient is using inhalers Patient is using O2 3 liters      Goals Addressed              This Visit's Progress    (THN)Manage Fatigue (Tiredness-COPD)   On track    Timeframe:  Long-Range Goal Priority:  Medium Start [id number]                             Expected End Date:     [id number]                Follow Up Date [id number] - develop a new routine to improve sleep - eat healthy - get outdoors every day (weather permitting) - use devices that will help like a cane, sock-puller or reacher    Why is this important?   Feeling tired or worn out is a common symptom of COPD (chronic obstructive pulmonary disease).  Learning when you feel your best and when you need rest is important.  Managing the tiredness (fatigue) will help you be active and enjoy life.     Notes:  [id number] Patient rests as needed to manage excessive fatigue     (THN)Track and Manage My Symptoms-COPD   On track    Timeframe:  Long-Range Goal Priority:  High Start Date:     [id number]                        Expected End Date: [id number]  Follow Up Date [id number]   - develop a rescue plan - eliminate symptom triggers at home - follow rescue plan if symptoms flare-up - keep follow-up appointments    Why is this important?   Tracking your symptoms and other information about your health helps your doctor plan your care.  Write down the symptoms, the time of day, what you were doing and what medicine you are taking.  You will soon learn how to manage your symptoms.     Notes:  [id number] Patient is monitoring symptoms and following through with health maintenance to prevent COPD exacerbation     (THN)Track and Manage My Triggers-COPD   On track    Timeframe:  Long-Range Goal Priority:  High Start Date:     [id number]                        Expected End Date:      [id number]           Follow Up Date [id number]  - identify and remove indoor air pollutants - limit outdoor activity during cold weather - listen for public air quality announcements every day    Why is this important?    Triggers are activities or things, like tobacco smoke or cold weather, that make your COPD (chronic obstructive pulmonary disease) flare-up.  Knowing these triggers helps you plan how to stay away from them.  When you cannot remove them, you can learn how to manage them.     Notes:  [id number] Patient is monitoring her symptoms [id number] Patient is monitoring symptoms and taking medications as per ordered        Plan:  Follow-up: Follow-up in 3 month(s) RN arranged homebound pharmacy to give patient COVID booster Patient will follow up with Pulmonologist for Smart vest RN sent patient COPD quick tips RN sent update assessment to PCP   [place] Management [phone]

## 2020-10-27 ENCOUNTER — Other Ambulatory Visit: Payer: Self-pay | Admitting: Osteopathic Medicine

## 2020-10-27 DIAGNOSIS — I10 Essential (primary) hypertension: Secondary | ICD-10-CM

## 2020-11-02 DIAGNOSIS — E785 Hyperlipidemia, unspecified: Secondary | ICD-10-CM | POA: Diagnosis not present

## 2020-11-02 DIAGNOSIS — I2510 Atherosclerotic heart disease of native coronary artery without angina pectoris: Secondary | ICD-10-CM | POA: Diagnosis not present

## 2020-11-02 DIAGNOSIS — I10 Essential (primary) hypertension: Secondary | ICD-10-CM | POA: Diagnosis not present

## 2020-11-02 DIAGNOSIS — Z955 Presence of coronary angioplasty implant and graft: Secondary | ICD-10-CM | POA: Diagnosis not present

## 2020-11-04 DIAGNOSIS — F411 Generalized anxiety disorder: Secondary | ICD-10-CM | POA: Diagnosis not present

## 2020-11-04 DIAGNOSIS — F5101 Primary insomnia: Secondary | ICD-10-CM | POA: Diagnosis not present

## 2020-11-04 DIAGNOSIS — F319 Bipolar disorder, unspecified: Secondary | ICD-10-CM | POA: Diagnosis not present

## 2020-11-15 DIAGNOSIS — Z20828 Contact with and (suspected) exposure to other viral communicable diseases: Secondary | ICD-10-CM | POA: Diagnosis not present

## 2020-11-24 ENCOUNTER — Other Ambulatory Visit: Payer: Self-pay

## 2020-11-24 DIAGNOSIS — K219 Gastro-esophageal reflux disease without esophagitis: Secondary | ICD-10-CM

## 2020-11-24 MED ORDER — OMEPRAZOLE 40 MG PO CPDR: 40.0000 mg | DELAYED_RELEASE_CAPSULE | Freq: Every day | ORAL | 3 refills | Status: DC

## 2020-12-02 DIAGNOSIS — C3411 Malignant neoplasm of upper lobe, right bronchus or lung: Secondary | ICD-10-CM | POA: Diagnosis not present

## 2020-12-02 DIAGNOSIS — J9811 Atelectasis: Secondary | ICD-10-CM | POA: Diagnosis not present

## 2020-12-02 DIAGNOSIS — I10 Essential (primary) hypertension: Secondary | ICD-10-CM | POA: Diagnosis not present

## 2020-12-02 DIAGNOSIS — I2510 Atherosclerotic heart disease of native coronary artery without angina pectoris: Secondary | ICD-10-CM | POA: Diagnosis not present

## 2020-12-02 DIAGNOSIS — J9611 Chronic respiratory failure with hypoxia: Secondary | ICD-10-CM | POA: Diagnosis not present

## 2020-12-02 DIAGNOSIS — R0600 Dyspnea, unspecified: Secondary | ICD-10-CM | POA: Diagnosis not present

## 2020-12-02 DIAGNOSIS — J449 Chronic obstructive pulmonary disease, unspecified: Secondary | ICD-10-CM | POA: Diagnosis not present

## 2020-12-07 DIAGNOSIS — Z20822 Contact with and (suspected) exposure to covid-19: Secondary | ICD-10-CM | POA: Diagnosis not present

## 2020-12-08 DIAGNOSIS — Z20822 Contact with and (suspected) exposure to covid-19: Secondary | ICD-10-CM | POA: Diagnosis not present

## 2020-12-13 DIAGNOSIS — J441 Chronic obstructive pulmonary disease with (acute) exacerbation: Secondary | ICD-10-CM | POA: Diagnosis not present

## 2020-12-13 DIAGNOSIS — F17200 Nicotine dependence, unspecified, uncomplicated: Secondary | ICD-10-CM | POA: Diagnosis not present

## 2020-12-13 DIAGNOSIS — F17210 Nicotine dependence, cigarettes, uncomplicated: Secondary | ICD-10-CM | POA: Diagnosis not present

## 2020-12-13 DIAGNOSIS — Z0000 Encounter for general adult medical examination without abnormal findings: Secondary | ICD-10-CM | POA: Diagnosis not present

## 2020-12-13 DIAGNOSIS — F5101 Primary insomnia: Secondary | ICD-10-CM | POA: Diagnosis not present

## 2020-12-13 DIAGNOSIS — I10 Essential (primary) hypertension: Secondary | ICD-10-CM | POA: Diagnosis not present

## 2020-12-13 DIAGNOSIS — J449 Chronic obstructive pulmonary disease, unspecified: Secondary | ICD-10-CM | POA: Diagnosis not present

## 2020-12-13 DIAGNOSIS — F319 Bipolar disorder, unspecified: Secondary | ICD-10-CM | POA: Diagnosis not present

## 2020-12-13 DIAGNOSIS — I2510 Atherosclerotic heart disease of native coronary artery without angina pectoris: Secondary | ICD-10-CM | POA: Diagnosis not present

## 2020-12-13 DIAGNOSIS — R350 Frequency of micturition: Secondary | ICD-10-CM | POA: Diagnosis not present

## 2020-12-13 DIAGNOSIS — J9611 Chronic respiratory failure with hypoxia: Secondary | ICD-10-CM | POA: Diagnosis not present

## 2020-12-13 DIAGNOSIS — E559 Vitamin D deficiency, unspecified: Secondary | ICD-10-CM | POA: Diagnosis not present

## 2020-12-13 DIAGNOSIS — E785 Hyperlipidemia, unspecified: Secondary | ICD-10-CM | POA: Diagnosis not present

## 2020-12-16 DIAGNOSIS — I2510 Atherosclerotic heart disease of native coronary artery without angina pectoris: Secondary | ICD-10-CM | POA: Diagnosis not present

## 2020-12-16 DIAGNOSIS — I10 Essential (primary) hypertension: Secondary | ICD-10-CM | POA: Diagnosis not present

## 2020-12-16 DIAGNOSIS — Z955 Presence of coronary angioplasty implant and graft: Secondary | ICD-10-CM | POA: Diagnosis not present

## 2020-12-16 DIAGNOSIS — E785 Hyperlipidemia, unspecified: Secondary | ICD-10-CM | POA: Diagnosis not present

## 2021-01-21 ENCOUNTER — Other Ambulatory Visit: Payer: Self-pay | Admitting: Osteopathic Medicine

## 2021-01-25 ENCOUNTER — Other Ambulatory Visit: Payer: Self-pay | Admitting: *Deleted

## 2021-01-25 NOTE — Patient Outreach (Signed)
[facility]) Care Management  01/25/2021  REIZY DUNLOW 10/25/1940 [id number]   RN Health Coach attempted follow up outreach call to patient.  Patient was unavailable. HIPPA compliance voicemail message left with return callback number.  Plan: RN will call patient again within 30 days.  [facility] Care Management [phone]

## 2021-02-02 DIAGNOSIS — L98499 Non-pressure chronic ulcer of skin of other sites with unspecified severity: Secondary | ICD-10-CM | POA: Diagnosis not present

## 2021-02-16 DIAGNOSIS — R609 Edema, unspecified: Secondary | ICD-10-CM | POA: Diagnosis not present

## 2021-02-16 DIAGNOSIS — L98499 Non-pressure chronic ulcer of skin of other sites with unspecified severity: Secondary | ICD-10-CM | POA: Diagnosis not present

## 2021-02-16 DIAGNOSIS — L89323 Pressure ulcer of left buttock, stage 3: Secondary | ICD-10-CM | POA: Diagnosis not present

## 2021-02-16 DIAGNOSIS — L97929 Non-pressure chronic ulcer of unspecified part of left lower leg with unspecified severity: Secondary | ICD-10-CM | POA: Diagnosis not present

## 2021-02-16 DIAGNOSIS — I83029 Varicose veins of left lower extremity with ulcer of unspecified site: Secondary | ICD-10-CM | POA: Diagnosis not present

## 2021-02-18 DIAGNOSIS — I872 Venous insufficiency (chronic) (peripheral): Secondary | ICD-10-CM | POA: Diagnosis not present

## 2021-02-18 DIAGNOSIS — E871 Hypo-osmolality and hyponatremia: Secondary | ICD-10-CM | POA: Diagnosis not present

## 2021-02-18 DIAGNOSIS — N3000 Acute cystitis without hematuria: Secondary | ICD-10-CM | POA: Diagnosis not present

## 2021-02-18 DIAGNOSIS — Z7952 Long term (current) use of systemic steroids: Secondary | ICD-10-CM | POA: Diagnosis not present

## 2021-02-18 DIAGNOSIS — Z9181 History of falling: Secondary | ICD-10-CM | POA: Diagnosis not present

## 2021-02-18 DIAGNOSIS — Z7982 Long term (current) use of aspirin: Secondary | ICD-10-CM | POA: Diagnosis not present

## 2021-02-18 DIAGNOSIS — L89323 Pressure ulcer of left buttock, stage 3: Secondary | ICD-10-CM | POA: Diagnosis not present

## 2021-02-18 DIAGNOSIS — J9602 Acute respiratory failure with hypercapnia: Secondary | ICD-10-CM | POA: Diagnosis not present

## 2021-02-18 DIAGNOSIS — L97821 Non-pressure chronic ulcer of other part of left lower leg limited to breakdown of skin: Secondary | ICD-10-CM | POA: Diagnosis not present

## 2021-02-18 DIAGNOSIS — F5101 Primary insomnia: Secondary | ICD-10-CM | POA: Diagnosis not present

## 2021-02-18 DIAGNOSIS — I2510 Atherosclerotic heart disease of native coronary artery without angina pectoris: Secondary | ICD-10-CM | POA: Diagnosis not present

## 2021-02-18 DIAGNOSIS — F319 Bipolar disorder, unspecified: Secondary | ICD-10-CM | POA: Diagnosis not present

## 2021-02-18 DIAGNOSIS — J441 Chronic obstructive pulmonary disease with (acute) exacerbation: Secondary | ICD-10-CM | POA: Diagnosis not present

## 2021-02-18 DIAGNOSIS — C3411 Malignant neoplasm of upper lobe, right bronchus or lung: Secondary | ICD-10-CM | POA: Diagnosis not present

## 2021-02-18 DIAGNOSIS — K5900 Constipation, unspecified: Secondary | ICD-10-CM | POA: Diagnosis not present

## 2021-02-18 DIAGNOSIS — I10 Essential (primary) hypertension: Secondary | ICD-10-CM | POA: Diagnosis not present

## 2021-02-18 DIAGNOSIS — Z955 Presence of coronary angioplasty implant and graft: Secondary | ICD-10-CM | POA: Diagnosis not present

## 2021-02-18 DIAGNOSIS — E785 Hyperlipidemia, unspecified: Secondary | ICD-10-CM | POA: Diagnosis not present

## 2021-02-21 DIAGNOSIS — N3000 Acute cystitis without hematuria: Secondary | ICD-10-CM | POA: Diagnosis not present

## 2021-02-21 DIAGNOSIS — C3411 Malignant neoplasm of upper lobe, right bronchus or lung: Secondary | ICD-10-CM | POA: Diagnosis not present

## 2021-02-21 DIAGNOSIS — I872 Venous insufficiency (chronic) (peripheral): Secondary | ICD-10-CM | POA: Diagnosis not present

## 2021-02-21 DIAGNOSIS — I10 Essential (primary) hypertension: Secondary | ICD-10-CM | POA: Diagnosis not present

## 2021-02-21 DIAGNOSIS — L89323 Pressure ulcer of left buttock, stage 3: Secondary | ICD-10-CM | POA: Diagnosis not present

## 2021-02-21 DIAGNOSIS — L97821 Non-pressure chronic ulcer of other part of left lower leg limited to breakdown of skin: Secondary | ICD-10-CM | POA: Diagnosis not present

## 2021-02-22 ENCOUNTER — Other Ambulatory Visit: Payer: Self-pay | Admitting: *Deleted

## 2021-02-22 NOTE — Patient Outreach (Signed)
[name]) Care Management  02/22/2021  Anita Krause 09/25/1940 [id number]   RN Health Coach attempted follow up outreach call to patient.  Patient was unavailable. HIPPA compliance voicemail message left with return callback number.  Plan: RN will call patient again within 30 days.  [place] Care Management [phone]

## 2021-02-23 DIAGNOSIS — E11621 Type 2 diabetes mellitus with foot ulcer: Secondary | ICD-10-CM | POA: Diagnosis not present

## 2021-02-23 DIAGNOSIS — L97929 Non-pressure chronic ulcer of unspecified part of left lower leg with unspecified severity: Secondary | ICD-10-CM | POA: Diagnosis not present

## 2021-02-23 DIAGNOSIS — I83029 Varicose veins of left lower extremity with ulcer of unspecified site: Secondary | ICD-10-CM | POA: Diagnosis not present

## 2021-02-23 DIAGNOSIS — L97422 Non-pressure chronic ulcer of left heel and midfoot with fat layer exposed: Secondary | ICD-10-CM | POA: Diagnosis not present

## 2021-02-23 DIAGNOSIS — L89323 Pressure ulcer of left buttock, stage 3: Secondary | ICD-10-CM | POA: Diagnosis not present

## 2021-02-25 DIAGNOSIS — I872 Venous insufficiency (chronic) (peripheral): Secondary | ICD-10-CM | POA: Diagnosis not present

## 2021-02-25 DIAGNOSIS — N3000 Acute cystitis without hematuria: Secondary | ICD-10-CM | POA: Diagnosis not present

## 2021-02-25 DIAGNOSIS — L97821 Non-pressure chronic ulcer of other part of left lower leg limited to breakdown of skin: Secondary | ICD-10-CM | POA: Diagnosis not present

## 2021-02-25 DIAGNOSIS — I10 Essential (primary) hypertension: Secondary | ICD-10-CM | POA: Diagnosis not present

## 2021-02-25 DIAGNOSIS — C3411 Malignant neoplasm of upper lobe, right bronchus or lung: Secondary | ICD-10-CM | POA: Diagnosis not present

## 2021-02-25 DIAGNOSIS — L89323 Pressure ulcer of left buttock, stage 3: Secondary | ICD-10-CM | POA: Diagnosis not present

## 2021-02-28 DIAGNOSIS — L97821 Non-pressure chronic ulcer of other part of left lower leg limited to breakdown of skin: Secondary | ICD-10-CM | POA: Diagnosis not present

## 2021-02-28 DIAGNOSIS — N3000 Acute cystitis without hematuria: Secondary | ICD-10-CM | POA: Diagnosis not present

## 2021-02-28 DIAGNOSIS — I10 Essential (primary) hypertension: Secondary | ICD-10-CM | POA: Diagnosis not present

## 2021-02-28 DIAGNOSIS — C3411 Malignant neoplasm of upper lobe, right bronchus or lung: Secondary | ICD-10-CM | POA: Diagnosis not present

## 2021-02-28 DIAGNOSIS — I872 Venous insufficiency (chronic) (peripheral): Secondary | ICD-10-CM | POA: Diagnosis not present

## 2021-02-28 DIAGNOSIS — L89323 Pressure ulcer of left buttock, stage 3: Secondary | ICD-10-CM | POA: Diagnosis not present

## 2021-03-04 DIAGNOSIS — L97821 Non-pressure chronic ulcer of other part of left lower leg limited to breakdown of skin: Secondary | ICD-10-CM | POA: Diagnosis not present

## 2021-03-04 DIAGNOSIS — L89323 Pressure ulcer of left buttock, stage 3: Secondary | ICD-10-CM | POA: Diagnosis not present

## 2021-03-04 DIAGNOSIS — I872 Venous insufficiency (chronic) (peripheral): Secondary | ICD-10-CM | POA: Diagnosis not present

## 2021-03-04 DIAGNOSIS — N3000 Acute cystitis without hematuria: Secondary | ICD-10-CM | POA: Diagnosis not present

## 2021-03-04 DIAGNOSIS — C3411 Malignant neoplasm of upper lobe, right bronchus or lung: Secondary | ICD-10-CM | POA: Diagnosis not present

## 2021-03-04 DIAGNOSIS — I10 Essential (primary) hypertension: Secondary | ICD-10-CM | POA: Diagnosis not present

## 2021-03-07 DIAGNOSIS — N3000 Acute cystitis without hematuria: Secondary | ICD-10-CM | POA: Diagnosis not present

## 2021-03-07 DIAGNOSIS — I10 Essential (primary) hypertension: Secondary | ICD-10-CM | POA: Diagnosis not present

## 2021-03-07 DIAGNOSIS — C3411 Malignant neoplasm of upper lobe, right bronchus or lung: Secondary | ICD-10-CM | POA: Diagnosis not present

## 2021-03-07 DIAGNOSIS — L89323 Pressure ulcer of left buttock, stage 3: Secondary | ICD-10-CM | POA: Diagnosis not present

## 2021-03-07 DIAGNOSIS — L97821 Non-pressure chronic ulcer of other part of left lower leg limited to breakdown of skin: Secondary | ICD-10-CM | POA: Diagnosis not present

## 2021-03-07 DIAGNOSIS — I872 Venous insufficiency (chronic) (peripheral): Secondary | ICD-10-CM | POA: Diagnosis not present

## 2021-03-09 DIAGNOSIS — L97929 Non-pressure chronic ulcer of unspecified part of left lower leg with unspecified severity: Secondary | ICD-10-CM | POA: Diagnosis not present

## 2021-03-09 DIAGNOSIS — L89323 Pressure ulcer of left buttock, stage 3: Secondary | ICD-10-CM | POA: Diagnosis not present

## 2021-03-09 DIAGNOSIS — L98499 Non-pressure chronic ulcer of skin of other sites with unspecified severity: Secondary | ICD-10-CM | POA: Diagnosis not present

## 2021-03-09 DIAGNOSIS — I83029 Varicose veins of left lower extremity with ulcer of unspecified site: Secondary | ICD-10-CM | POA: Diagnosis not present

## 2021-03-11 DIAGNOSIS — I872 Venous insufficiency (chronic) (peripheral): Secondary | ICD-10-CM | POA: Diagnosis not present

## 2021-03-11 DIAGNOSIS — C3411 Malignant neoplasm of upper lobe, right bronchus or lung: Secondary | ICD-10-CM | POA: Diagnosis not present

## 2021-03-11 DIAGNOSIS — F5101 Primary insomnia: Secondary | ICD-10-CM | POA: Diagnosis not present

## 2021-03-11 DIAGNOSIS — L89323 Pressure ulcer of left buttock, stage 3: Secondary | ICD-10-CM | POA: Diagnosis not present

## 2021-03-11 DIAGNOSIS — L97821 Non-pressure chronic ulcer of other part of left lower leg limited to breakdown of skin: Secondary | ICD-10-CM | POA: Diagnosis not present

## 2021-03-11 DIAGNOSIS — N3000 Acute cystitis without hematuria: Secondary | ICD-10-CM | POA: Diagnosis not present

## 2021-03-11 DIAGNOSIS — I10 Essential (primary) hypertension: Secondary | ICD-10-CM | POA: Diagnosis not present

## 2021-03-11 DIAGNOSIS — F319 Bipolar disorder, unspecified: Secondary | ICD-10-CM | POA: Diagnosis not present

## 2021-03-11 DIAGNOSIS — F411 Generalized anxiety disorder: Secondary | ICD-10-CM | POA: Diagnosis not present

## 2021-03-14 DIAGNOSIS — J9611 Chronic respiratory failure with hypoxia: Secondary | ICD-10-CM | POA: Diagnosis not present

## 2021-03-14 DIAGNOSIS — L97821 Non-pressure chronic ulcer of other part of left lower leg limited to breakdown of skin: Secondary | ICD-10-CM | POA: Diagnosis not present

## 2021-03-14 DIAGNOSIS — I10 Essential (primary) hypertension: Secondary | ICD-10-CM | POA: Diagnosis not present

## 2021-03-14 DIAGNOSIS — C3411 Malignant neoplasm of upper lobe, right bronchus or lung: Secondary | ICD-10-CM | POA: Diagnosis not present

## 2021-03-14 DIAGNOSIS — J449 Chronic obstructive pulmonary disease, unspecified: Secondary | ICD-10-CM | POA: Diagnosis not present

## 2021-03-14 DIAGNOSIS — L89323 Pressure ulcer of left buttock, stage 3: Secondary | ICD-10-CM | POA: Diagnosis not present

## 2021-03-14 DIAGNOSIS — F17210 Nicotine dependence, cigarettes, uncomplicated: Secondary | ICD-10-CM | POA: Diagnosis not present

## 2021-03-14 DIAGNOSIS — I872 Venous insufficiency (chronic) (peripheral): Secondary | ICD-10-CM | POA: Diagnosis not present

## 2021-03-14 DIAGNOSIS — N3000 Acute cystitis without hematuria: Secondary | ICD-10-CM | POA: Diagnosis not present

## 2021-03-17 DIAGNOSIS — E785 Hyperlipidemia, unspecified: Secondary | ICD-10-CM | POA: Diagnosis not present

## 2021-03-17 DIAGNOSIS — C3411 Malignant neoplasm of upper lobe, right bronchus or lung: Secondary | ICD-10-CM | POA: Diagnosis not present

## 2021-03-17 DIAGNOSIS — F17210 Nicotine dependence, cigarettes, uncomplicated: Secondary | ICD-10-CM | POA: Diagnosis not present

## 2021-03-17 DIAGNOSIS — E559 Vitamin D deficiency, unspecified: Secondary | ICD-10-CM | POA: Diagnosis not present

## 2021-03-17 DIAGNOSIS — J9611 Chronic respiratory failure with hypoxia: Secondary | ICD-10-CM | POA: Diagnosis not present

## 2021-03-17 DIAGNOSIS — I10 Essential (primary) hypertension: Secondary | ICD-10-CM | POA: Diagnosis not present

## 2021-03-17 DIAGNOSIS — K219 Gastro-esophageal reflux disease without esophagitis: Secondary | ICD-10-CM | POA: Diagnosis not present

## 2021-03-17 DIAGNOSIS — M62838 Other muscle spasm: Secondary | ICD-10-CM | POA: Diagnosis not present

## 2021-03-17 DIAGNOSIS — I2510 Atherosclerotic heart disease of native coronary artery without angina pectoris: Secondary | ICD-10-CM | POA: Diagnosis not present

## 2021-03-17 DIAGNOSIS — N1831 Chronic kidney disease, stage 3a: Secondary | ICD-10-CM | POA: Diagnosis not present

## 2021-03-17 DIAGNOSIS — Z8639 Personal history of other endocrine, nutritional and metabolic disease: Secondary | ICD-10-CM | POA: Diagnosis not present

## 2021-03-18 DIAGNOSIS — I10 Essential (primary) hypertension: Secondary | ICD-10-CM | POA: Diagnosis not present

## 2021-03-18 DIAGNOSIS — I872 Venous insufficiency (chronic) (peripheral): Secondary | ICD-10-CM | POA: Diagnosis not present

## 2021-03-18 DIAGNOSIS — C3411 Malignant neoplasm of upper lobe, right bronchus or lung: Secondary | ICD-10-CM | POA: Diagnosis not present

## 2021-03-18 DIAGNOSIS — L89323 Pressure ulcer of left buttock, stage 3: Secondary | ICD-10-CM | POA: Diagnosis not present

## 2021-03-18 DIAGNOSIS — L97821 Non-pressure chronic ulcer of other part of left lower leg limited to breakdown of skin: Secondary | ICD-10-CM | POA: Diagnosis not present

## 2021-03-18 DIAGNOSIS — N3000 Acute cystitis without hematuria: Secondary | ICD-10-CM | POA: Diagnosis not present

## 2021-03-20 ENCOUNTER — Other Ambulatory Visit: Payer: Self-pay | Admitting: Osteopathic Medicine

## 2021-03-20 DIAGNOSIS — F319 Bipolar disorder, unspecified: Secondary | ICD-10-CM | POA: Diagnosis not present

## 2021-03-20 DIAGNOSIS — I2510 Atherosclerotic heart disease of native coronary artery without angina pectoris: Secondary | ICD-10-CM | POA: Diagnosis not present

## 2021-03-20 DIAGNOSIS — C3411 Malignant neoplasm of upper lobe, right bronchus or lung: Secondary | ICD-10-CM | POA: Diagnosis not present

## 2021-03-20 DIAGNOSIS — L97821 Non-pressure chronic ulcer of other part of left lower leg limited to breakdown of skin: Secondary | ICD-10-CM | POA: Diagnosis not present

## 2021-03-20 DIAGNOSIS — I872 Venous insufficiency (chronic) (peripheral): Secondary | ICD-10-CM | POA: Diagnosis not present

## 2021-03-20 DIAGNOSIS — Z9181 History of falling: Secondary | ICD-10-CM | POA: Diagnosis not present

## 2021-03-20 DIAGNOSIS — N3000 Acute cystitis without hematuria: Secondary | ICD-10-CM | POA: Diagnosis not present

## 2021-03-20 DIAGNOSIS — E871 Hypo-osmolality and hyponatremia: Secondary | ICD-10-CM | POA: Diagnosis not present

## 2021-03-20 DIAGNOSIS — J441 Chronic obstructive pulmonary disease with (acute) exacerbation: Secondary | ICD-10-CM | POA: Diagnosis not present

## 2021-03-20 DIAGNOSIS — F5101 Primary insomnia: Secondary | ICD-10-CM | POA: Diagnosis not present

## 2021-03-20 DIAGNOSIS — K5900 Constipation, unspecified: Secondary | ICD-10-CM | POA: Diagnosis not present

## 2021-03-20 DIAGNOSIS — Z955 Presence of coronary angioplasty implant and graft: Secondary | ICD-10-CM | POA: Diagnosis not present

## 2021-03-20 DIAGNOSIS — Z7982 Long term (current) use of aspirin: Secondary | ICD-10-CM | POA: Diagnosis not present

## 2021-03-20 DIAGNOSIS — J9602 Acute respiratory failure with hypercapnia: Secondary | ICD-10-CM | POA: Diagnosis not present

## 2021-03-20 DIAGNOSIS — L89323 Pressure ulcer of left buttock, stage 3: Secondary | ICD-10-CM | POA: Diagnosis not present

## 2021-03-20 DIAGNOSIS — Z7952 Long term (current) use of systemic steroids: Secondary | ICD-10-CM | POA: Diagnosis not present

## 2021-03-20 DIAGNOSIS — I10 Essential (primary) hypertension: Secondary | ICD-10-CM | POA: Diagnosis not present

## 2021-03-20 DIAGNOSIS — E785 Hyperlipidemia, unspecified: Secondary | ICD-10-CM | POA: Diagnosis not present

## 2021-03-21 DIAGNOSIS — L97821 Non-pressure chronic ulcer of other part of left lower leg limited to breakdown of skin: Secondary | ICD-10-CM | POA: Diagnosis not present

## 2021-03-21 DIAGNOSIS — C3411 Malignant neoplasm of upper lobe, right bronchus or lung: Secondary | ICD-10-CM | POA: Diagnosis not present

## 2021-03-21 DIAGNOSIS — N3000 Acute cystitis without hematuria: Secondary | ICD-10-CM | POA: Diagnosis not present

## 2021-03-21 DIAGNOSIS — I872 Venous insufficiency (chronic) (peripheral): Secondary | ICD-10-CM | POA: Diagnosis not present

## 2021-03-21 DIAGNOSIS — I10 Essential (primary) hypertension: Secondary | ICD-10-CM | POA: Diagnosis not present

## 2021-03-21 DIAGNOSIS — L89323 Pressure ulcer of left buttock, stage 3: Secondary | ICD-10-CM | POA: Diagnosis not present

## 2021-03-25 DIAGNOSIS — C3411 Malignant neoplasm of upper lobe, right bronchus or lung: Secondary | ICD-10-CM | POA: Diagnosis not present

## 2021-03-25 DIAGNOSIS — N3000 Acute cystitis without hematuria: Secondary | ICD-10-CM | POA: Diagnosis not present

## 2021-03-25 DIAGNOSIS — I872 Venous insufficiency (chronic) (peripheral): Secondary | ICD-10-CM | POA: Diagnosis not present

## 2021-03-25 DIAGNOSIS — L89323 Pressure ulcer of left buttock, stage 3: Secondary | ICD-10-CM | POA: Diagnosis not present

## 2021-03-25 DIAGNOSIS — I10 Essential (primary) hypertension: Secondary | ICD-10-CM | POA: Diagnosis not present

## 2021-03-25 DIAGNOSIS — L97821 Non-pressure chronic ulcer of other part of left lower leg limited to breakdown of skin: Secondary | ICD-10-CM | POA: Diagnosis not present

## 2021-03-28 DIAGNOSIS — L97821 Non-pressure chronic ulcer of other part of left lower leg limited to breakdown of skin: Secondary | ICD-10-CM | POA: Diagnosis not present

## 2021-03-28 DIAGNOSIS — I872 Venous insufficiency (chronic) (peripheral): Secondary | ICD-10-CM | POA: Diagnosis not present

## 2021-03-28 DIAGNOSIS — I10 Essential (primary) hypertension: Secondary | ICD-10-CM | POA: Diagnosis not present

## 2021-03-28 DIAGNOSIS — L89323 Pressure ulcer of left buttock, stage 3: Secondary | ICD-10-CM | POA: Diagnosis not present

## 2021-03-28 DIAGNOSIS — C3411 Malignant neoplasm of upper lobe, right bronchus or lung: Secondary | ICD-10-CM | POA: Diagnosis not present

## 2021-03-28 DIAGNOSIS — N3000 Acute cystitis without hematuria: Secondary | ICD-10-CM | POA: Diagnosis not present

## 2021-03-31 ENCOUNTER — Other Ambulatory Visit: Payer: Self-pay | Admitting: *Deleted

## 2021-03-31 NOTE — Patient Outreach (Signed)
[facility]) Care Management ? ?03/31/2021 ? ?ANGELIS GATES ?1940/10/17 ?[id number] ? ? ?RN Health Coach attempted follow up outreach call to patient.  Patient was unavailable. HIPPA compliance voicemail message left with return callback number. ? ?Plan: ?RN will call patient again within 30 days. ? ?Johny Shock BSN RN ?[place] Management ?[phone] ? ?

## 2021-04-01 DIAGNOSIS — I10 Essential (primary) hypertension: Secondary | ICD-10-CM | POA: Diagnosis not present

## 2021-04-01 DIAGNOSIS — N3000 Acute cystitis without hematuria: Secondary | ICD-10-CM | POA: Diagnosis not present

## 2021-04-01 DIAGNOSIS — C3411 Malignant neoplasm of upper lobe, right bronchus or lung: Secondary | ICD-10-CM | POA: Diagnosis not present

## 2021-04-01 DIAGNOSIS — I872 Venous insufficiency (chronic) (peripheral): Secondary | ICD-10-CM | POA: Diagnosis not present

## 2021-04-01 DIAGNOSIS — L97821 Non-pressure chronic ulcer of other part of left lower leg limited to breakdown of skin: Secondary | ICD-10-CM | POA: Diagnosis not present

## 2021-04-01 DIAGNOSIS — L89323 Pressure ulcer of left buttock, stage 3: Secondary | ICD-10-CM | POA: Diagnosis not present

## 2021-04-04 DIAGNOSIS — L89323 Pressure ulcer of left buttock, stage 3: Secondary | ICD-10-CM | POA: Diagnosis not present

## 2021-04-04 DIAGNOSIS — C3411 Malignant neoplasm of upper lobe, right bronchus or lung: Secondary | ICD-10-CM | POA: Diagnosis not present

## 2021-04-04 DIAGNOSIS — L97821 Non-pressure chronic ulcer of other part of left lower leg limited to breakdown of skin: Secondary | ICD-10-CM | POA: Diagnosis not present

## 2021-04-04 DIAGNOSIS — I10 Essential (primary) hypertension: Secondary | ICD-10-CM | POA: Diagnosis not present

## 2021-04-04 DIAGNOSIS — N3000 Acute cystitis without hematuria: Secondary | ICD-10-CM | POA: Diagnosis not present

## 2021-04-04 DIAGNOSIS — I872 Venous insufficiency (chronic) (peripheral): Secondary | ICD-10-CM | POA: Diagnosis not present

## 2021-04-08 DIAGNOSIS — C3411 Malignant neoplasm of upper lobe, right bronchus or lung: Secondary | ICD-10-CM | POA: Diagnosis not present

## 2021-04-08 DIAGNOSIS — I872 Venous insufficiency (chronic) (peripheral): Secondary | ICD-10-CM | POA: Diagnosis not present

## 2021-04-08 DIAGNOSIS — L97821 Non-pressure chronic ulcer of other part of left lower leg limited to breakdown of skin: Secondary | ICD-10-CM | POA: Diagnosis not present

## 2021-04-08 DIAGNOSIS — N3000 Acute cystitis without hematuria: Secondary | ICD-10-CM | POA: Diagnosis not present

## 2021-04-08 DIAGNOSIS — L89323 Pressure ulcer of left buttock, stage 3: Secondary | ICD-10-CM | POA: Diagnosis not present

## 2021-04-08 DIAGNOSIS — I10 Essential (primary) hypertension: Secondary | ICD-10-CM | POA: Diagnosis not present

## 2021-04-11 DIAGNOSIS — L89323 Pressure ulcer of left buttock, stage 3: Secondary | ICD-10-CM | POA: Diagnosis not present

## 2021-04-11 DIAGNOSIS — L97821 Non-pressure chronic ulcer of other part of left lower leg limited to breakdown of skin: Secondary | ICD-10-CM | POA: Diagnosis not present

## 2021-04-11 DIAGNOSIS — I10 Essential (primary) hypertension: Secondary | ICD-10-CM | POA: Diagnosis not present

## 2021-04-11 DIAGNOSIS — N3000 Acute cystitis without hematuria: Secondary | ICD-10-CM | POA: Diagnosis not present

## 2021-04-11 DIAGNOSIS — C3411 Malignant neoplasm of upper lobe, right bronchus or lung: Secondary | ICD-10-CM | POA: Diagnosis not present

## 2021-04-11 DIAGNOSIS — I872 Venous insufficiency (chronic) (peripheral): Secondary | ICD-10-CM | POA: Diagnosis not present

## 2021-04-15 DIAGNOSIS — N3000 Acute cystitis without hematuria: Secondary | ICD-10-CM | POA: Diagnosis not present

## 2021-04-15 DIAGNOSIS — I10 Essential (primary) hypertension: Secondary | ICD-10-CM | POA: Diagnosis not present

## 2021-04-15 DIAGNOSIS — L89323 Pressure ulcer of left buttock, stage 3: Secondary | ICD-10-CM | POA: Diagnosis not present

## 2021-04-15 DIAGNOSIS — L97821 Non-pressure chronic ulcer of other part of left lower leg limited to breakdown of skin: Secondary | ICD-10-CM | POA: Diagnosis not present

## 2021-04-15 DIAGNOSIS — I872 Venous insufficiency (chronic) (peripheral): Secondary | ICD-10-CM | POA: Diagnosis not present

## 2021-04-15 DIAGNOSIS — C3411 Malignant neoplasm of upper lobe, right bronchus or lung: Secondary | ICD-10-CM | POA: Diagnosis not present

## 2021-04-18 DIAGNOSIS — Z20822 Contact with and (suspected) exposure to covid-19: Secondary | ICD-10-CM | POA: Diagnosis not present

## 2021-04-18 DIAGNOSIS — T8133XD Disruption of traumatic injury wound repair, subsequent encounter: Secondary | ICD-10-CM | POA: Diagnosis not present

## 2021-04-18 DIAGNOSIS — L89323 Pressure ulcer of left buttock, stage 3: Secondary | ICD-10-CM | POA: Diagnosis not present

## 2021-04-18 DIAGNOSIS — R609 Edema, unspecified: Secondary | ICD-10-CM | POA: Diagnosis not present

## 2021-04-19 DIAGNOSIS — Z955 Presence of coronary angioplasty implant and graft: Secondary | ICD-10-CM | POA: Diagnosis not present

## 2021-04-19 DIAGNOSIS — Z794 Long term (current) use of insulin: Secondary | ICD-10-CM | POA: Diagnosis not present

## 2021-04-19 DIAGNOSIS — Z7952 Long term (current) use of systemic steroids: Secondary | ICD-10-CM | POA: Diagnosis not present

## 2021-04-19 DIAGNOSIS — F5101 Primary insomnia: Secondary | ICD-10-CM | POA: Diagnosis not present

## 2021-04-19 DIAGNOSIS — I10 Essential (primary) hypertension: Secondary | ICD-10-CM | POA: Diagnosis not present

## 2021-04-19 DIAGNOSIS — E785 Hyperlipidemia, unspecified: Secondary | ICD-10-CM | POA: Diagnosis not present

## 2021-04-19 DIAGNOSIS — E1151 Type 2 diabetes mellitus with diabetic peripheral angiopathy without gangrene: Secondary | ICD-10-CM | POA: Diagnosis not present

## 2021-04-19 DIAGNOSIS — I8392 Asymptomatic varicose veins of left lower extremity: Secondary | ICD-10-CM | POA: Diagnosis not present

## 2021-04-19 DIAGNOSIS — J441 Chronic obstructive pulmonary disease with (acute) exacerbation: Secondary | ICD-10-CM | POA: Diagnosis not present

## 2021-04-19 DIAGNOSIS — K5900 Constipation, unspecified: Secondary | ICD-10-CM | POA: Diagnosis not present

## 2021-04-19 DIAGNOSIS — Z7982 Long term (current) use of aspirin: Secondary | ICD-10-CM | POA: Diagnosis not present

## 2021-04-19 DIAGNOSIS — C3411 Malignant neoplasm of upper lobe, right bronchus or lung: Secondary | ICD-10-CM | POA: Diagnosis not present

## 2021-04-19 DIAGNOSIS — E669 Obesity, unspecified: Secondary | ICD-10-CM | POA: Diagnosis not present

## 2021-04-19 DIAGNOSIS — J9602 Acute respiratory failure with hypercapnia: Secondary | ICD-10-CM | POA: Diagnosis not present

## 2021-04-19 DIAGNOSIS — F319 Bipolar disorder, unspecified: Secondary | ICD-10-CM | POA: Diagnosis not present

## 2021-04-19 DIAGNOSIS — E871 Hypo-osmolality and hyponatremia: Secondary | ICD-10-CM | POA: Diagnosis not present

## 2021-04-19 DIAGNOSIS — I2510 Atherosclerotic heart disease of native coronary artery without angina pectoris: Secondary | ICD-10-CM | POA: Diagnosis not present

## 2021-04-21 DIAGNOSIS — I8392 Asymptomatic varicose veins of left lower extremity: Secondary | ICD-10-CM | POA: Diagnosis not present

## 2021-04-21 DIAGNOSIS — E1151 Type 2 diabetes mellitus with diabetic peripheral angiopathy without gangrene: Secondary | ICD-10-CM | POA: Diagnosis not present

## 2021-04-21 DIAGNOSIS — E785 Hyperlipidemia, unspecified: Secondary | ICD-10-CM | POA: Diagnosis not present

## 2021-04-21 DIAGNOSIS — E669 Obesity, unspecified: Secondary | ICD-10-CM | POA: Diagnosis not present

## 2021-04-21 DIAGNOSIS — E871 Hypo-osmolality and hyponatremia: Secondary | ICD-10-CM | POA: Diagnosis not present

## 2021-04-21 DIAGNOSIS — C3411 Malignant neoplasm of upper lobe, right bronchus or lung: Secondary | ICD-10-CM | POA: Diagnosis not present

## 2021-04-28 DIAGNOSIS — I8392 Asymptomatic varicose veins of left lower extremity: Secondary | ICD-10-CM | POA: Diagnosis not present

## 2021-04-28 DIAGNOSIS — E785 Hyperlipidemia, unspecified: Secondary | ICD-10-CM | POA: Diagnosis not present

## 2021-04-28 DIAGNOSIS — E1151 Type 2 diabetes mellitus with diabetic peripheral angiopathy without gangrene: Secondary | ICD-10-CM | POA: Diagnosis not present

## 2021-04-28 DIAGNOSIS — E871 Hypo-osmolality and hyponatremia: Secondary | ICD-10-CM | POA: Diagnosis not present

## 2021-04-28 DIAGNOSIS — E669 Obesity, unspecified: Secondary | ICD-10-CM | POA: Diagnosis not present

## 2021-04-28 DIAGNOSIS — C3411 Malignant neoplasm of upper lobe, right bronchus or lung: Secondary | ICD-10-CM | POA: Diagnosis not present

## 2021-05-02 ENCOUNTER — Other Ambulatory Visit: Payer: Self-pay

## 2021-05-02 MED ORDER — FUROSEMIDE 20 MG PO TABS: 20.0000 mg | ORAL_TABLET | Freq: Every day | ORAL | 0 refills | Status: DC

## 2021-05-04 DIAGNOSIS — E669 Obesity, unspecified: Secondary | ICD-10-CM | POA: Diagnosis not present

## 2021-05-04 DIAGNOSIS — E871 Hypo-osmolality and hyponatremia: Secondary | ICD-10-CM | POA: Diagnosis not present

## 2021-05-04 DIAGNOSIS — E1151 Type 2 diabetes mellitus with diabetic peripheral angiopathy without gangrene: Secondary | ICD-10-CM | POA: Diagnosis not present

## 2021-05-04 DIAGNOSIS — E673 Hypervitaminosis D: Secondary | ICD-10-CM | POA: Diagnosis not present

## 2021-05-04 DIAGNOSIS — E785 Hyperlipidemia, unspecified: Secondary | ICD-10-CM | POA: Diagnosis not present

## 2021-05-04 DIAGNOSIS — C3411 Malignant neoplasm of upper lobe, right bronchus or lung: Secondary | ICD-10-CM | POA: Diagnosis not present

## 2021-05-04 DIAGNOSIS — I8392 Asymptomatic varicose veins of left lower extremity: Secondary | ICD-10-CM | POA: Diagnosis not present

## 2021-05-09 DIAGNOSIS — Z20822 Contact with and (suspected) exposure to covid-19: Secondary | ICD-10-CM | POA: Diagnosis not present

## 2021-05-10 ENCOUNTER — Other Ambulatory Visit: Payer: Self-pay | Admitting: *Deleted

## 2021-05-10 NOTE — Patient Outreach (Signed)
[facility]) Care Management RN Health Coach Note   05/10/2021 Name:  Anita Krause MRN:  [id number] DOB:  [birth year]  Summary: No admissions for COPD exacerbation since last outreach. No recent falls. Patient stated she does not wear compression hose due to bruising an discomfort.   Recommendations/Changes made from today's visit: Medication adherence Take frequent rest time Monitor for COPD exacerbation triggers    Subjective: Anita Krause is an 81 y.o. year old female who is a primary patient of No primary care provider on file.. The care management team was consulted for assistance with care management and/or care coordination needs.    RN Health Coach completed Telephone Visit today.   Objective:  Medications Reviewed Today     Reviewed by Dara Lords, PA-C (Physician Assistant) on 09/09/20 at 1007  Med List Status: <None>   Medication Order Taking? Sig Documenting Provider Last Dose Status Informant  albuterol (PROVENTIL) (5 MG/ML) 0.5% nebulizer solution [id number] Yes Take 0.5 mLs (2.5 mg total) by nebulization every 6 (six) hours as needed for wheezing or shortness of breath. Derwood Kaplan, MD Taking Active   albuterol (VENTOLIN HFA) 108 (90 Base) MCG/ACT inhaler [id number] Yes Inhale 2 puffs into the lungs every 6 (six) hours as needed for wheezing or shortness of breath. [provider] Taking Active   ALPRAZolam (XANAX) 0.25 MG tablet [id number] Yes Take 0.25 mg by mouth daily as needed. [provider] Taking Active   AMBULATORY NON FORMULARY MEDICATION [id number] Yes Compression stockings to under knee, measure for fit, compression rating 10-20 mmHg. Disp: as many pairs as desired; refill x99 Sunnie Nielsen, DO Taking Active   aspirin EC 81 MG tablet [id number] Yes Take 81 mg by mouth daily. Swallow whole. [provider] Taking Active   azithromycin (ZITHROMAX) 250 MG tablet [id number] Yes Take 250 mg by mouth daily.  [provider] Taking Active   benzonatate (TESSALON) 200 MG capsule [id number] Yes Take 1 capsule (200 mg total) by mouth 3 (three) times daily as needed for cough. Sunnie Nielsen, DO Taking Active   carvedilol (COREG) 6.25 MG tablet [id number] Yes Take 6.25 mg by mouth 2 (two) times daily with a meal. [provider] Taking Active   cetirizine (ZYRTEC) 10 MG tablet [id number] Yes Take 1 tablet (10 mg total) by mouth daily. Sunnie Nielsen, DO Taking Active   Cholecalciferol 25 MCG (1000 UT) tablet [id number] Yes Take 1,000 Units by mouth daily. [provider] Taking Active   clotrimazole (LOTRIMIN) 1 % cream [id number] Yes Apply topically 2 (two) times daily. Sunnie Nielsen, DO Taking Active   cyclobenzaprine (FLEXERIL) 5 MG tablet [id number] Yes Take 1-2 tablets (5-10 mg total) by mouth 3 (three) times daily as needed for muscle spasms. Clayborne Dana, NP Taking Active   furosemide (LASIX) 20 MG tablet [id number] Yes Take 1 tablet (20 mg total) by mouth daily. Sunnie Nielsen, DO Taking Active   hydrOXYzine (ATARAX/VISTARIL) 25 MG tablet [id number] Yes Take 1 tablet (25 mg total) by mouth 2 (two) times daily as needed for anxiety. Tollie Eth, NP Taking Active   meloxicam (MOBIC) 7.5 MG tablet [id number] Yes Take 1 tablet (7.5 mg total) po one or twice daily as needed for aches/pains Sunnie Nielsen, DO Taking Active   metoprolol succinate (TOPROL-XL) 25 MG 24 hr tablet [id number] Yes Take 25 mg by mouth daily. [provider] Taking Active   montelukast (SINGULAIR) 10 MG tablet [id number] Yes Take 10 mg by mouth at  bedtime. [provider] Taking Active Self  Nystatin POWD [id number] Yes Place 1 application onto the skin in the morning, at noon, and at bedtime. Clayborne Dana, NP Taking Active   omeprazole (PRILOSEC) 40 MG capsule [id number] Yes Take 1 capsule (40 mg total) by mouth daily. Sunnie Nielsen, DO Taking Active   potassium chloride  SA (KLOR-CON) 20 MEQ tablet [id number] Yes Take 1 tablet (20 mEq total) by mouth daily. Sunnie Nielsen, DO Taking Active   predniSONE (DELTASONE) 10 MG tablet [id number] Yes Take by mouth. [provider] Taking Active   risperiDONE (RISPERDAL) 1 MG tablet [id number] Yes Take 1 mg by mouth at bedtime. [provider] Taking Active   rosuvastatin (CRESTOR) 40 MG tablet [id number] Yes Take 40 mg by mouth daily. [provider] Taking Active   sulfamethoxazole-trimethoprim (BACTRIM DS) 800-160 MG tablet [id number] Yes Take 1 tablet by mouth 2 (two) times daily. Clayborne Dana, NP Taking Active   SYMBICORT 80-4.5 MCG/ACT inhaler [id number] Yes Inhale into the lungs. [provider] Taking Active   triamterene-hydrochlorothiazide (DYAZIDE) 37.5-25 MG capsule [id number] Yes TAKE 1 CAPSULE BY MOUTH TWICE DAILY FOR HIGH BLOOD PRESSURE Sunnie Nielsen, DO Taking Active              SDOH:  (Social Determinants of Health) assessments and interventions performed:  SDOH Interventions    Flowsheet Row Most Recent Value  SDOH Interventions   Food Insecurity Interventions Intervention Not Indicated  Housing Interventions Intervention Not Indicated  Transportation Interventions Intervention Not Indicated       Care Plan  Review of patient past medical history, allergies, medications, health status, including review of consultants reports, laboratory and other test data, was performed as part of comprehensive evaluation for care management services.   Care Plan : COPD (Adult)  Updates made by Luella Cook, RN since 05/10/2021 12:00 AM     Problem: Disease Progression (COPD) Resolved 05/10/2021  Priority: High  Onset Date: 05/26/2020  Note:   [id number] Resolving due to duplicate goal     Long-Range Goal: Disease Progression Minimized or Managed Completed 05/10/2021  Start Date: 05/26/2020  Expected End Date: 12/31/2021  Recent Progress: On track   Priority: High  Note:   Evidence-based guidance:  Identify current smoking/tobacco use; provide smoking cessation intervention.  Assess symptom control by the frequency and type of symptoms, reliever use and activity limitation at every encounter.  Assess risk for exacerbation (flare up) by evaluating spirometry, pulse oximetry, reliever use, presentation of symptoms and activity limitation; anticipate treatment adjustment based on risks and resources.  Develop and/or review and reinforce use of COPD rescue (action) plan even when symptoms are controlled or infrequent.  Ask patient to bring inhaler to all visits; assess and reinforce correct technique; address barriers to proper inhaler use, such as older age, use of multiple devices and lack of understanding.   Identify symptom triggers, such as smoking, virus, weather change, emotional upset, exercise, obesity and environmental allergen; consider reduction of work-exposure versus elimination to avoid compromising employment.  Correlate presentation to comorbidity, such as diabetes, heart failure, obstructive sleep apnea, depression and anxiety, which may worsen symptoms.  Prepare for individualized pharmacologic therapy that may include LABA (long-acting beta-2 agonist), LAMA (long-acting muscarinic antagonist), SABA (short-acting beta-2 agonist) oral or inhaled corticosteroid.  Promote participation in pulmonary rehabilitation for breathing exercises, skills training, improved exercise capacity, mood and quality of life; address barriers to participation.  Promote physical activity or exercise to improve or maintain exercise  capacity, based on tolerance that may include walking, water exercise, cycling or limb muscle strength training.  Promote use of energy conservation and activity pacing techniques.  Promote use of breathing and coughing techniques, such as inspiratory muscle training, pursed-lip breathing, diaphragmatic breathing, pranayama  yoga breathing or huff cough.  Screen for malnutrition risk factors, such as unintentional weight loss and poor oral intake; refer to dietitian if identified.  Consider recommendation for oral drink supplement or multivitamin and mineral supplements if suspect inadequate oral intake or micronutrient deficiencies.   Screen for obstructive sleep apnea; prepare patient for polysomnography based on risk and presentation.  Prepare patient for use of long-term oxygen and noninvasive ventilation to relieve hypercapnia, hypoxemia, obstructive sleep apnea and reduce work of breathing.  Prepare patient with worsening disease for surgical interventions that may include bronchoscopy, lung volume reduction surgery, bullectomy or lung transplantation.   Notes:  [id number] Patient is following through with health maintenance Flu vaccine and scheduling COVID vaccine    Task: Alleviate Barriers to COPD Management Completed 05/10/2021  Due Date: 12/31/2021  Note:   Care Management Activities:    - activity or exercise based on tolerance encouraged - breathing techniques encouraged - communicable disease prevention promoted - individualized medical nutrition therapy provided - medication-adherence assessment completed - rescue (action) plan reviewed - self-awareness of symptom triggers encouraged    Notes:     Problem: Symptom Exacerbation (COPD) Resolved 05/10/2021  Priority: High  Onset Date: 05/26/2020  Note:   [id number] Resolving due to duplicate goal     Goal: Symptom Exacerbation Prevented or Minimized Completed 05/10/2021  Start Date: 05/26/2020  Expected End Date: 12/31/2021  Recent Progress: On track  Priority: High  Note:   Evidence-based guidance:  Monitor for signs of respiratory infection, including changes in sputum color, volume and thickness, as well as fever.  Encourage infection prevention strategies that may include prophylactic antibiotic therapy for patients with history of frequent  exacerbations or antibiotic administration during exacerbation based on presentation, risk and benefit.  Encourage receipt of influenza and pneumococcal vaccine.  Prepare patient for use of home long-term oxygen therapy in presence of sever resting hypoxemia.  Prepare patients for laboratory studies or diagnostic exams, such as spirometry, pulse oximetry and arterial blood gas based on current symptoms, risk factors and presentation.  Assess barriers and manage adherence, including inhaler technique and persistent trigger exposure; encourage adherence, even when symptoms are controlled or infrequent.  Assess and monitor for signs/symptoms of psychosocial concerns, such as shortness of breath-anxiety cycle or depression that may impact stability of symptoms.  Identify economic resources, sociocultural beliefs, social factors and health literacy that may interfere with adherence.  Promote lifestyle changes when needed, including regular physical activity based on tolerance, weight loss, healthy eating and stress management.  Consider referral to nurse or community health worker or home-visiting program for intensive support and education (disease-management program).  Increase frequency of follow-up following exacerbation or hospitalization; consider transition of care interventions, such as hospital visit, home visit, telephone follow-up, review of discharge summary and resource referrals.   Notes:  [id number] Patient has not had any admissions for COPD [id number] No admissions for COPD exacerbation    Task: Identify and Minimize Risk of COPD Exacerbation Completed 05/10/2021  Due Date: 12/31/2021  Note:   Care Management Activities:    - healthy lifestyle promoted - signs/symptoms of worsening disease assessed - symptom triggers identified    Notes:  [id number] Patient has been using breathing techniques Patient is using  inhalers Patient is using O2 3 liters    Care Plan : RN Care Manager Plan  of Care  Updates made by Kalyan Barabas, Dennard Schaumann, RN since 05/10/2021 12:00 AM     Problem: Knowledge Deficit Related to COPD and Care Coordination needs   Priority: High     Long-Range Goal: Development Plan of Care for Management of COPD   Start Date: 05/10/2021  Expected End Date: 05/11/2022  Priority: High  Note:   Current Barriers:  Knowledge Deficits related to plan of care for management of COPD   RNCM Clinical Goal(s):  Patient will verbalize understanding of plan for management of COPD as evidenced by no COPD exacerbation  through collaboration with RN Care manager, provider, and care team.   Interventions: Inter-disciplinary care team collaboration (see longitudinal plan of care) Evaluation of current treatment plan related to  self management and patient's adherence to plan as established by provider   COPD Interventions:  (Status:  Goal on track:  NO.) Long Term Goal Provided patient with basic written and verbal COPD education on self care/management/and exacerbation prevention Advised patient to track and manage COPD triggers Advised patient to self assesses COPD action plan zone and make appointment with provider if in the yellow zone for 48 hours without improvement Advised patient to engage in light exercise as tolerated 3-5 days a week to aid in the the management of COPD Provided education about and advised patient to utilize infection prevention strategies to reduce risk of respiratory infection Discussed the importance of adequate rest and management of fatigue with COPD  Patient Goals/Self-Care Activities: Take all medications as prescribed Attend all scheduled provider appointments Call pharmacy for medication refills 3-7 days in advance of running out of medications Call provider office for new concerns or questions  call the Suicide and Crisis Lifeline: 988 if experiencing a Mental Health or Behavioral Health Crisis  eliminate smoking in my home limit outdoor  activity during cold weather listen for public air quality announcements every day eliminate symptom triggers at home use devices that will help like a cane, sock-puller or reacher  Follow Up Plan:  Telephone follow up appointment with care management team member scheduled for:  August 17, 2021. The patient has been provided with contact information for the care management team and has been advised to call with any health related questions or concerns.    [id number] Per patient she is doing ok. She is monitoring for the triggers for COPD exacerbation. She stated she was unable to wear the compression hose because they were causing bruising. She is still currently smoking.Patient is using O2.      Plan: Telephone follow up appointment with care management team member scheduled for:  August 17, 2021 The patient has been provided with contact information for the care management team and has been advised to call with any health related questions or concerns.   Gean Maidens BSN RN [facility] Care Management [phone]

## 2021-05-10 NOTE — Patient Instructions (Signed)
Visit Information ? ?Thank you for taking time to visit with me today. Please don't hesitate to contact me if I can be of assistance to you before our next scheduled telephone appointment. ? ?Following are the goals we discussed today:  ?Current Barriers:  ?Knowledge Deficits related to plan of care for management of COPD  ? ?RNCM Clinical Goal(s):  ?Patient will verbalize understanding of plan for management of COPD as evidenced by no COPD exacerbation through collaboration with RN Care manager, provider, and care team.  ? ?Interventions: ?Inter-disciplinary care team collaboration (see longitudinal plan of care) ?Evaluation of current treatment plan related to  self management and patient's adherence to plan as established by provider ? ? ?COPD Interventions:  (Status:  Goal on track:  NO.) Long Term Goal ?Provided patient with basic written and verbal COPD education on self care/management/and exacerbation prevention ?Advised patient to track and manage COPD triggers ?Advised patient to self assesses COPD action plan zone and make appointment with provider if in the yellow zone for 48 hours without improvement ?Advised patient to engage in light exercise as tolerated 3-5 days a week to aid in the the management of COPD ?Provided education about and advised patient to utilize infection prevention strategies to reduce risk of respiratory infection ?Discussed the importance of adequate rest and management of fatigue with COPD ? ?Patient Goals/Self-Care Activities: ?Take all medications as prescribed ?Attend all scheduled provider appointments ?Call pharmacy for medication refills 3-7 days in advance of running out of medications ?Call provider office for new concerns or questions  ?call the Suicide and Crisis Lifeline: 988 if experiencing a Mental Health or [place]  ?eliminate smoking in my home ?limit outdoor activity during cold weather ?listen for public air quality announcements every  day ?eliminate symptom triggers at home ?use devices that will help like a cane, sock-puller or reacher ? ?Follow Up Plan:  Telephone follow up appointment with care management team member scheduled for:  August 17, 2021. ?The patient has been provided with contact information for the care management team and has been advised to call with any health related questions or concerns.   ? ?[id number] Per patient she is doing ok. She is monitoring for the triggers for COPD exacerbation. She stated she was unable to wear the compression hose because they were causing bruising. She is still currently smoking.Patient is using O2. ?Our next appointment is by telephone on August 17, 2021 ? ?Please call Johny Shock RN [phone]  if you need to cancel or reschedule your appointment.  ? ?Please call the Suicide and Crisis Lifeline: 988 if you are experiencing a Mental Health or [place] or need someone to talk to. ? ?The patient verbalized understanding of instructions, educational materials, and care plan provided today and agreed to receive a mailed copy of patient instructions, educational materials, and care plan.  ? ?Telephone follow up appointment with care management team member scheduled for: ?The patient has been provided with contact information for the care management team and has been advised to call with any health related questions or concerns.  ? ?SIGNATURE ? ?Johny Shock BSN RN ?[facility] ?[phone] ? ? ?  ?

## 2021-05-11 DIAGNOSIS — E871 Hypo-osmolality and hyponatremia: Secondary | ICD-10-CM | POA: Diagnosis not present

## 2021-05-11 DIAGNOSIS — E669 Obesity, unspecified: Secondary | ICD-10-CM | POA: Diagnosis not present

## 2021-05-11 DIAGNOSIS — E785 Hyperlipidemia, unspecified: Secondary | ICD-10-CM | POA: Diagnosis not present

## 2021-05-11 DIAGNOSIS — C3411 Malignant neoplasm of upper lobe, right bronchus or lung: Secondary | ICD-10-CM | POA: Diagnosis not present

## 2021-05-11 DIAGNOSIS — E1151 Type 2 diabetes mellitus with diabetic peripheral angiopathy without gangrene: Secondary | ICD-10-CM | POA: Diagnosis not present

## 2021-05-11 DIAGNOSIS — I8392 Asymptomatic varicose veins of left lower extremity: Secondary | ICD-10-CM | POA: Diagnosis not present

## 2021-05-18 DIAGNOSIS — I8392 Asymptomatic varicose veins of left lower extremity: Secondary | ICD-10-CM | POA: Diagnosis not present

## 2021-05-18 DIAGNOSIS — E785 Hyperlipidemia, unspecified: Secondary | ICD-10-CM | POA: Diagnosis not present

## 2021-05-18 DIAGNOSIS — C3411 Malignant neoplasm of upper lobe, right bronchus or lung: Secondary | ICD-10-CM | POA: Diagnosis not present

## 2021-05-18 DIAGNOSIS — E669 Obesity, unspecified: Secondary | ICD-10-CM | POA: Diagnosis not present

## 2021-05-18 DIAGNOSIS — E871 Hypo-osmolality and hyponatremia: Secondary | ICD-10-CM | POA: Diagnosis not present

## 2021-05-18 DIAGNOSIS — E1151 Type 2 diabetes mellitus with diabetic peripheral angiopathy without gangrene: Secondary | ICD-10-CM | POA: Diagnosis not present

## 2021-05-19 DIAGNOSIS — C3411 Malignant neoplasm of upper lobe, right bronchus or lung: Secondary | ICD-10-CM | POA: Diagnosis not present

## 2021-05-19 DIAGNOSIS — J441 Chronic obstructive pulmonary disease with (acute) exacerbation: Secondary | ICD-10-CM | POA: Diagnosis not present

## 2021-05-19 DIAGNOSIS — E1151 Type 2 diabetes mellitus with diabetic peripheral angiopathy without gangrene: Secondary | ICD-10-CM | POA: Diagnosis not present

## 2021-05-19 DIAGNOSIS — Z7982 Long term (current) use of aspirin: Secondary | ICD-10-CM | POA: Diagnosis not present

## 2021-05-19 DIAGNOSIS — Z7952 Long term (current) use of systemic steroids: Secondary | ICD-10-CM | POA: Diagnosis not present

## 2021-05-19 DIAGNOSIS — Z794 Long term (current) use of insulin: Secondary | ICD-10-CM | POA: Diagnosis not present

## 2021-05-19 DIAGNOSIS — I2510 Atherosclerotic heart disease of native coronary artery without angina pectoris: Secondary | ICD-10-CM | POA: Diagnosis not present

## 2021-05-19 DIAGNOSIS — K5900 Constipation, unspecified: Secondary | ICD-10-CM | POA: Diagnosis not present

## 2021-05-19 DIAGNOSIS — E785 Hyperlipidemia, unspecified: Secondary | ICD-10-CM | POA: Diagnosis not present

## 2021-05-19 DIAGNOSIS — E871 Hypo-osmolality and hyponatremia: Secondary | ICD-10-CM | POA: Diagnosis not present

## 2021-05-19 DIAGNOSIS — J9602 Acute respiratory failure with hypercapnia: Secondary | ICD-10-CM | POA: Diagnosis not present

## 2021-05-19 DIAGNOSIS — I10 Essential (primary) hypertension: Secondary | ICD-10-CM | POA: Diagnosis not present

## 2021-05-19 DIAGNOSIS — F319 Bipolar disorder, unspecified: Secondary | ICD-10-CM | POA: Diagnosis not present

## 2021-05-19 DIAGNOSIS — E669 Obesity, unspecified: Secondary | ICD-10-CM | POA: Diagnosis not present

## 2021-05-19 DIAGNOSIS — Z955 Presence of coronary angioplasty implant and graft: Secondary | ICD-10-CM | POA: Diagnosis not present

## 2021-05-19 DIAGNOSIS — F5101 Primary insomnia: Secondary | ICD-10-CM | POA: Diagnosis not present

## 2021-05-19 DIAGNOSIS — I8392 Asymptomatic varicose veins of left lower extremity: Secondary | ICD-10-CM | POA: Diagnosis not present

## 2021-05-25 DIAGNOSIS — E669 Obesity, unspecified: Secondary | ICD-10-CM | POA: Diagnosis not present

## 2021-05-25 DIAGNOSIS — E785 Hyperlipidemia, unspecified: Secondary | ICD-10-CM | POA: Diagnosis not present

## 2021-05-25 DIAGNOSIS — I8392 Asymptomatic varicose veins of left lower extremity: Secondary | ICD-10-CM | POA: Diagnosis not present

## 2021-05-25 DIAGNOSIS — C3411 Malignant neoplasm of upper lobe, right bronchus or lung: Secondary | ICD-10-CM | POA: Diagnosis not present

## 2021-05-25 DIAGNOSIS — E871 Hypo-osmolality and hyponatremia: Secondary | ICD-10-CM | POA: Diagnosis not present

## 2021-05-25 DIAGNOSIS — E1151 Type 2 diabetes mellitus with diabetic peripheral angiopathy without gangrene: Secondary | ICD-10-CM | POA: Diagnosis not present

## 2021-06-03 DIAGNOSIS — C3411 Malignant neoplasm of upper lobe, right bronchus or lung: Secondary | ICD-10-CM | POA: Diagnosis not present

## 2021-06-03 DIAGNOSIS — E871 Hypo-osmolality and hyponatremia: Secondary | ICD-10-CM | POA: Diagnosis not present

## 2021-06-03 DIAGNOSIS — E1151 Type 2 diabetes mellitus with diabetic peripheral angiopathy without gangrene: Secondary | ICD-10-CM | POA: Diagnosis not present

## 2021-06-03 DIAGNOSIS — E785 Hyperlipidemia, unspecified: Secondary | ICD-10-CM | POA: Diagnosis not present

## 2021-06-03 DIAGNOSIS — I8392 Asymptomatic varicose veins of left lower extremity: Secondary | ICD-10-CM | POA: Diagnosis not present

## 2021-06-03 DIAGNOSIS — E669 Obesity, unspecified: Secondary | ICD-10-CM | POA: Diagnosis not present

## 2021-06-07 ENCOUNTER — Other Ambulatory Visit: Payer: Self-pay | Admitting: Physician Assistant

## 2021-06-07 DIAGNOSIS — C3411 Malignant neoplasm of upper lobe, right bronchus or lung: Secondary | ICD-10-CM | POA: Diagnosis not present

## 2021-06-07 DIAGNOSIS — E871 Hypo-osmolality and hyponatremia: Secondary | ICD-10-CM | POA: Diagnosis not present

## 2021-06-07 DIAGNOSIS — E1151 Type 2 diabetes mellitus with diabetic peripheral angiopathy without gangrene: Secondary | ICD-10-CM | POA: Diagnosis not present

## 2021-06-07 DIAGNOSIS — I8392 Asymptomatic varicose veins of left lower extremity: Secondary | ICD-10-CM | POA: Diagnosis not present

## 2021-06-07 DIAGNOSIS — E785 Hyperlipidemia, unspecified: Secondary | ICD-10-CM | POA: Diagnosis not present

## 2021-06-07 DIAGNOSIS — E669 Obesity, unspecified: Secondary | ICD-10-CM | POA: Diagnosis not present

## 2021-06-08 DIAGNOSIS — F5101 Primary insomnia: Secondary | ICD-10-CM | POA: Diagnosis not present

## 2021-06-08 DIAGNOSIS — F319 Bipolar disorder, unspecified: Secondary | ICD-10-CM | POA: Diagnosis not present

## 2021-06-08 DIAGNOSIS — F411 Generalized anxiety disorder: Secondary | ICD-10-CM | POA: Diagnosis not present

## 2021-06-13 DIAGNOSIS — J449 Chronic obstructive pulmonary disease, unspecified: Secondary | ICD-10-CM | POA: Diagnosis not present

## 2021-06-13 DIAGNOSIS — J9611 Chronic respiratory failure with hypoxia: Secondary | ICD-10-CM | POA: Diagnosis not present

## 2021-06-13 DIAGNOSIS — R053 Chronic cough: Secondary | ICD-10-CM | POA: Diagnosis not present

## 2021-06-13 DIAGNOSIS — F17210 Nicotine dependence, cigarettes, uncomplicated: Secondary | ICD-10-CM | POA: Diagnosis not present

## 2021-06-13 DIAGNOSIS — G4733 Obstructive sleep apnea (adult) (pediatric): Secondary | ICD-10-CM | POA: Diagnosis not present

## 2021-06-16 DIAGNOSIS — E871 Hypo-osmolality and hyponatremia: Secondary | ICD-10-CM | POA: Diagnosis not present

## 2021-06-16 DIAGNOSIS — E669 Obesity, unspecified: Secondary | ICD-10-CM | POA: Diagnosis not present

## 2021-06-16 DIAGNOSIS — I8392 Asymptomatic varicose veins of left lower extremity: Secondary | ICD-10-CM | POA: Diagnosis not present

## 2021-06-16 DIAGNOSIS — E1151 Type 2 diabetes mellitus with diabetic peripheral angiopathy without gangrene: Secondary | ICD-10-CM | POA: Diagnosis not present

## 2021-06-16 DIAGNOSIS — E785 Hyperlipidemia, unspecified: Secondary | ICD-10-CM | POA: Diagnosis not present

## 2021-06-16 DIAGNOSIS — C3411 Malignant neoplasm of upper lobe, right bronchus or lung: Secondary | ICD-10-CM | POA: Diagnosis not present

## 2021-06-21 DIAGNOSIS — Z23 Encounter for immunization: Secondary | ICD-10-CM | POA: Diagnosis not present

## 2021-06-23 ENCOUNTER — Other Ambulatory Visit: Payer: Self-pay | Admitting: Osteopathic Medicine

## 2021-06-28 DIAGNOSIS — Z955 Presence of coronary angioplasty implant and graft: Secondary | ICD-10-CM | POA: Diagnosis not present

## 2021-06-28 DIAGNOSIS — I2510 Atherosclerotic heart disease of native coronary artery without angina pectoris: Secondary | ICD-10-CM | POA: Diagnosis not present

## 2021-06-28 DIAGNOSIS — E785 Hyperlipidemia, unspecified: Secondary | ICD-10-CM | POA: Diagnosis not present

## 2021-06-28 DIAGNOSIS — I10 Essential (primary) hypertension: Secondary | ICD-10-CM | POA: Diagnosis not present

## 2021-06-28 DIAGNOSIS — N1831 Chronic kidney disease, stage 3a: Secondary | ICD-10-CM | POA: Diagnosis not present

## 2021-06-30 DIAGNOSIS — L820 Inflamed seborrheic keratosis: Secondary | ICD-10-CM | POA: Diagnosis not present

## 2021-07-07 DIAGNOSIS — R404 Transient alteration of awareness: Secondary | ICD-10-CM | POA: Diagnosis not present

## 2021-07-07 DIAGNOSIS — I469 Cardiac arrest, cause unspecified: Secondary | ICD-10-CM | POA: Diagnosis not present

## 2021-07-07 DIAGNOSIS — I10 Essential (primary) hypertension: Secondary | ICD-10-CM | POA: Diagnosis not present

## 2021-07-07 DIAGNOSIS — R0689 Other abnormalities of breathing: Secondary | ICD-10-CM | POA: Diagnosis not present

## 2021-07-07 DIAGNOSIS — Z880 Allergy status to penicillin: Secondary | ICD-10-CM | POA: Diagnosis not present

## 2021-07-07 DIAGNOSIS — I252 Old myocardial infarction: Secondary | ICD-10-CM | POA: Diagnosis not present

## 2021-07-07 DIAGNOSIS — I499 Cardiac arrhythmia, unspecified: Secondary | ICD-10-CM | POA: Diagnosis not present

## 2021-07-07 DIAGNOSIS — R600 Localized edema: Secondary | ICD-10-CM | POA: Diagnosis not present

## 2021-07-07 DIAGNOSIS — F17210 Nicotine dependence, cigarettes, uncomplicated: Secondary | ICD-10-CM | POA: Diagnosis not present

## 2021-07-07 DIAGNOSIS — Z888 Allergy status to other drugs, medicaments and biological substances status: Secondary | ICD-10-CM | POA: Diagnosis not present

## 2021-07-07 DIAGNOSIS — R918 Other nonspecific abnormal finding of lung field: Secondary | ICD-10-CM | POA: Diagnosis not present

## 2021-07-07 DIAGNOSIS — Z883 Allergy status to other anti-infective agents status: Secondary | ICD-10-CM | POA: Diagnosis not present

## 2021-07-07 DIAGNOSIS — R000 Tachycardia, unspecified: Secondary | ICD-10-CM | POA: Diagnosis not present

## 2021-07-08 ENCOUNTER — Other Ambulatory Visit: Payer: Self-pay | Admitting: *Deleted

## 2021-08-02 DEATH — deceased

## 2021-08-17 ENCOUNTER — Ambulatory Visit: Payer: Self-pay | Admitting: *Deleted

## 2021-09-21 ENCOUNTER — Ambulatory Visit: Payer: Medicare Other | Admitting: *Deleted
